# Patient Record
Sex: Female | Born: 1985
Health system: Southern US, Community
[De-identification: ages and names within clinical notes are randomized; demographics above are authoritative.]

## PROBLEM LIST (undated history)

## (undated) ENCOUNTER — Inpatient Hospital Stay (HOSPITAL_COMMUNITY): Payer: Self-pay

## (undated) DIAGNOSIS — I10 Essential (primary) hypertension: Secondary | ICD-10-CM

## (undated) DIAGNOSIS — A599 Trichomoniasis, unspecified: Secondary | ICD-10-CM

## (undated) DIAGNOSIS — O09219 Supervision of pregnancy with history of pre-term labor, unspecified trimester: Secondary | ICD-10-CM

## (undated) DIAGNOSIS — F32A Depression, unspecified: Secondary | ICD-10-CM

## (undated) DIAGNOSIS — F329 Major depressive disorder, single episode, unspecified: Secondary | ICD-10-CM

## (undated) DIAGNOSIS — F1210 Cannabis abuse, uncomplicated: Secondary | ICD-10-CM

## (undated) DIAGNOSIS — O0930 Supervision of pregnancy with insufficient antenatal care, unspecified trimester: Secondary | ICD-10-CM

## (undated) DIAGNOSIS — F419 Anxiety disorder, unspecified: Secondary | ICD-10-CM

---
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
## 2006-01-11 ENCOUNTER — Ambulatory Visit: Payer: Self-pay | Admitting: Gynecology

## 2006-01-15 ENCOUNTER — Ambulatory Visit: Payer: Self-pay | Admitting: Family Medicine

## 2006-01-17 ENCOUNTER — Inpatient Hospital Stay (HOSPITAL_COMMUNITY): Admission: AD | Admit: 2006-01-17 | Discharge: 2006-01-17 | Payer: Self-pay | Admitting: Gynecology

## 2006-01-17 ENCOUNTER — Ambulatory Visit: Payer: Self-pay | Admitting: *Deleted

## 2006-01-25 ENCOUNTER — Ambulatory Visit (HOSPITAL_COMMUNITY): Admission: RE | Admit: 2006-01-25 | Discharge: 2006-01-25 | Payer: Self-pay | Admitting: Obstetrics & Gynecology

## 2006-02-01 ENCOUNTER — Ambulatory Visit: Payer: Self-pay | Admitting: Family Medicine

## 2006-02-15 ENCOUNTER — Ambulatory Visit: Payer: Self-pay | Admitting: Gynecology

## 2006-03-01 ENCOUNTER — Ambulatory Visit: Payer: Self-pay | Admitting: Gynecology

## 2006-03-22 ENCOUNTER — Ambulatory Visit: Payer: Self-pay | Admitting: Family Medicine

## 2006-03-29 ENCOUNTER — Encounter (INDEPENDENT_AMBULATORY_CARE_PROVIDER_SITE_OTHER): Payer: Self-pay | Admitting: *Deleted

## 2006-03-29 ENCOUNTER — Inpatient Hospital Stay (HOSPITAL_COMMUNITY): Admission: AD | Admit: 2006-03-29 | Discharge: 2006-04-01 | Payer: Self-pay | Admitting: Gynecology

## 2006-03-29 ENCOUNTER — Ambulatory Visit: Payer: Self-pay | Admitting: Obstetrics and Gynecology

## 2007-03-19 ENCOUNTER — Inpatient Hospital Stay (HOSPITAL_COMMUNITY): Admission: AD | Admit: 2007-03-19 | Discharge: 2007-03-21 | Payer: Self-pay | Admitting: Obstetrics & Gynecology

## 2007-03-19 ENCOUNTER — Ambulatory Visit: Payer: Self-pay | Admitting: Family Medicine

## 2007-03-20 ENCOUNTER — Encounter: Payer: Self-pay | Admitting: Family Medicine

## 2007-07-03 ENCOUNTER — Emergency Department (HOSPITAL_COMMUNITY): Admission: EM | Admit: 2007-07-03 | Discharge: 2007-07-03 | Payer: Self-pay | Admitting: Family Medicine

## 2008-12-25 ENCOUNTER — Emergency Department (HOSPITAL_COMMUNITY): Admission: EM | Admit: 2008-12-25 | Discharge: 2008-12-25 | Payer: Self-pay | Admitting: Family Medicine

## 2009-02-23 ENCOUNTER — Ambulatory Visit (HOSPITAL_COMMUNITY): Admission: RE | Admit: 2009-02-23 | Discharge: 2009-02-23 | Payer: Self-pay | Admitting: Obstetrics

## 2009-02-23 ENCOUNTER — Inpatient Hospital Stay (HOSPITAL_COMMUNITY): Admission: AD | Admit: 2009-02-23 | Discharge: 2009-02-23 | Payer: Self-pay | Admitting: Obstetrics

## 2009-05-19 ENCOUNTER — Ambulatory Visit (HOSPITAL_COMMUNITY): Admission: RE | Admit: 2009-05-19 | Discharge: 2009-05-19 | Payer: Self-pay | Admitting: Obstetrics

## 2009-06-17 ENCOUNTER — Ambulatory Visit (HOSPITAL_COMMUNITY): Admission: RE | Admit: 2009-06-17 | Discharge: 2009-06-17 | Payer: Self-pay | Admitting: Obstetrics

## 2009-06-29 ENCOUNTER — Inpatient Hospital Stay (HOSPITAL_COMMUNITY): Admission: AD | Admit: 2009-06-29 | Discharge: 2009-06-29 | Payer: Self-pay | Admitting: Obstetrics

## 2009-06-29 ENCOUNTER — Inpatient Hospital Stay (HOSPITAL_COMMUNITY): Admission: AD | Admit: 2009-06-29 | Discharge: 2009-07-01 | Payer: Self-pay | Admitting: Obstetrics

## 2010-07-31 ENCOUNTER — Encounter: Payer: Self-pay | Admitting: Obstetrics

## 2010-07-31 ENCOUNTER — Encounter: Payer: Self-pay | Admitting: *Deleted

## 2010-10-10 LAB — CBC
Hemoglobin: 12.9 g/dL (ref 12.0–15.0)
MCV: 96.6 fL (ref 78.0–100.0)
RBC: 3.73 MIL/uL — ABNORMAL LOW (ref 3.87–5.11)
RBC: 4.03 MIL/uL (ref 3.87–5.11)
WBC: 17.8 10*3/uL — ABNORMAL HIGH (ref 4.0–10.5)
WBC: 21.2 10*3/uL — ABNORMAL HIGH (ref 4.0–10.5)

## 2010-10-10 LAB — COMPREHENSIVE METABOLIC PANEL
ALT: 22 U/L (ref 0–35)
Alkaline Phosphatase: 130 U/L — ABNORMAL HIGH (ref 39–117)
CO2: 21 mEq/L (ref 19–32)
Chloride: 103 mEq/L (ref 96–112)
GFR calc non Af Amer: >60 mL/min (ref >60)
Glucose, Bld: 81 mg/dL (ref 70–99)
Potassium: 3.7 mEq/L (ref 3.5–5.1)
Sodium: 132 mEq/L — ABNORMAL LOW (ref 135–145)
Total Bilirubin: 0.5 mg/dL (ref 0.3–1.2)
Total Protein: 6.5 g/dL (ref 6.0–8.3)

## 2010-10-10 LAB — URINE MICROSCOPIC-ADD ON

## 2010-10-10 LAB — URINALYSIS, ROUTINE W REFLEX MICROSCOPIC
Glucose, UA: NEGATIVE mg/dL
Protein, ur: NEGATIVE mg/dL

## 2010-10-10 LAB — LACTATE DEHYDROGENASE: LDH: 153 U/L (ref 94–250)

## 2010-10-15 LAB — GLUCOSE, CAPILLARY: Glucose-Capillary: 63 mg/dL — ABNORMAL LOW (ref 70–99)

## 2010-10-15 LAB — DIFFERENTIAL
Basophils Absolute: 0 K/uL (ref 0.0–0.1)
Basophils Relative: 0 % (ref 0–1)
Eosinophils Absolute: 0.2 K/uL (ref 0.0–0.7)
Eosinophils Relative: 2 % (ref 0–5)
Lymphocytes Relative: 9 % — ABNORMAL LOW (ref 12–46)
Lymphs Abs: 1.5 K/uL (ref 0.7–4.0)
Monocytes Absolute: 0.3 K/uL (ref 0.1–1.0)
Monocytes Relative: 2 % — ABNORMAL LOW (ref 3–12)
Neutro Abs: 14.3 K/uL — ABNORMAL HIGH (ref 1.7–7.7)
Neutrophils Relative %: 87 % — ABNORMAL HIGH (ref 43–77)

## 2010-10-15 LAB — COMPREHENSIVE METABOLIC PANEL WITH GFR
ALT: 16 U/L (ref 0–35)
AST: 36 U/L (ref 0–37)
Albumin: 3.2 g/dL — ABNORMAL LOW (ref 3.5–5.2)
Alkaline Phosphatase: 51 U/L (ref 39–117)
BUN: 3 mg/dL — ABNORMAL LOW (ref 6–23)
CO2: 25 meq/L (ref 19–32)
Calcium: 9.2 mg/dL (ref 8.4–10.5)
Chloride: 105 meq/L (ref 96–112)
Creatinine, Ser: 0.4 mg/dL (ref 0.4–1.2)
GFR calc non Af Amer: >60 mL/min
Glucose, Bld: 74 mg/dL (ref 70–99)
Potassium: 3.9 meq/L (ref 3.5–5.1)
Sodium: 136 meq/L (ref 135–145)
Total Bilirubin: 0.4 mg/dL (ref 0.3–1.2)
Total Protein: 6 g/dL (ref 6.0–8.3)

## 2010-10-15 LAB — CBC
HCT: 38.8 % (ref 36.0–46.0)
Hemoglobin: 13.3 g/dL (ref 12.0–15.0)
MCHC: 34.3 g/dL (ref 30.0–36.0)
MCV: 98 fL (ref 78.0–100.0)
Platelets: 213 K/uL (ref 150–400)
RBC: 3.96 MIL/uL (ref 3.87–5.11)
RDW: 13.1 % (ref 11.5–15.5)
WBC: 16.4 K/uL — ABNORMAL HIGH (ref 4.0–10.5)

## 2010-10-17 LAB — POCT PREGNANCY, URINE: Preg Test, Ur: POSITIVE

## 2010-11-25 NOTE — Discharge Summary (Signed)
Shannon Gonzales, Shannon Gonzales                ACCOUNT NO.:  [id number]   MEDICAL RECORD NO.:  [id number]          PATIENT TYPE:  INP   LOCATION:  9148                          [facility]   PHYSICIAN:  Tracy L. Mayford Knife, M.D.DATE OF BIRTH:  [birth year]   DATE OF ADMISSION:  03/29/2006  DATE OF DISCHARGE:  04/01/2006                                 DISCHARGE SUMMARY   ADMISSION DIAGNOSIS:  Spontaneous onset of labor at 37 weeks 2 days.   DISCHARGE DIAGNOSES:  1. Status post primary low transverse cesarean section for nonreassuring      fetal heart tones.  2. History of intrauterine fetal demise.  3. History of herpes simplex virus.   PERTINENT LABORATORY DATA:  Admit hemoglobin 10.8, discharge hemoglobin  11.9.   HOSPITAL COURSE:  The patient is a 25 year old, G3, P 0-1-1-0, at 37 weeks 2  days who presented with spontaneous onset of labor. She was initially 5 cm.  The patient's fetal heart tracing became nonreassuring as indicated by  severe variables and did not respond to amnio infusion.  Decision was made  to take her to the operating room.  Please see op note for full details.  The patient delivered a viable female, Apgars 9 and 9.  Postoperative was  unremarkable.  The patient ambulated on postoperative day #1 and voided  without difficulty.  Hemoglobin as above.  The patient was found to be  stable for discharge on postoperative day #3 and discharged home.  Follow up  in six weeks at [facility].   DISCHARGE MEDICATIONS:  1. Percocet 5 mg q.4h. p.r.n. pain.  2. Ibuprofen 600 mg one q.6h. p.r.n. pain.  3. Colace 100 mg p.o. b.i.d.  4. Prenatal vitamins.  5. Micronor one tablet daily.  Advised to start on two weeks and to change      to full OCPs when she discontinues breast feeding.   DISCHARGE INSTRUCTIONS:  1. __________  for six weeks.  2. No heavy lifting.  3. Return for evaluation with signs of infection.  4. Diet regular.  5. Activity as above.     ______________________________  Marc Morgans. Mayford Knife, M.D.     TLW/MEDQ  D:  04/01/2006  T:  04/03/2006  Job:  [id number]

## 2010-11-25 NOTE — Op Note (Signed)
[name], Shannon Gonzales                ACCOUNT NO.:  [id number]   MEDICAL RECORD NO.:  [id number]          PATIENT TYPE:  AMB   LOCATION:  SDC                           [facility]  [facility]   PHYSICIAN:  James A. Ashley Royalty, M.D.DATE OF BIRTH:  05/27/1986   DATE OF PROCEDURE:  02/24/2005  DATE OF DISCHARGE:                                 OPERATIVE REPORT   PREOPERATIVE DIAGNOSIS:  Missed abortion at approximately nine weeks'  gestation.   POSTOPERATIVE DIAGNOSIS:  Missed abortion at approximately nine weeks'  gestation. Path pending.   PROCEDURE:  Suction dilatation curettage.   SURGEON:  Rudy Jew. Ashley Royalty, M.D.   ANESTHESIA:  Monitored anesthesia care with 1% Xylocaine paracervical block  (20 mL).   ESTIMATED BLOOD LOSS:  Less than 50 mL.   COMPLICATIONS:  None.   PACKS DRAINS:  None.   PROCEDURE:  The patient was taken to the operating room and placed in  dorsosupine position. After IV sedation was administered she was placed in  the lithotomy position, prepped, draped in usual manner for vaginal surgery.  Posterior weighted retractor was placed per vagina and anterior lip of the  cervix grasped with single-tooth tenaculum. The uterus was gently sounded to  approximately 9 cm. The cervical os was opened slightly verified already  open to a diameter of the 31-French. 9 mm suction curette was placed in the  uterine cavity. Suction applied. Later a 10 mm suction curette was employed.  After several passes with the suction curette no additional tissue was  obtained. All curettings were submitted to pathology for histologic studies.  The vaginal instruments were removed. Hemostasis noted and procedure  terminated.   The patient tolerated procedure extremely well and was returned to the  recovery room in good condition.           ______________________________  Rudy Jew Ashley Royalty, M.D.     JAM/MEDQ  D:  02/24/2005  T:  02/24/2005  Job:  [id number]

## 2010-11-25 NOTE — H&P (Signed)
Shannon Gonzales, Shannon Gonzales                ACCOUNT NO.:  [id number]   MEDICAL RECORD NO.:  [id number]          PATIENT TYPE:  OUT   LOCATION:  ULT                           [facility]   PHYSICIAN:  James A. Ashley Royalty, M.D.DATE OF BIRTH:  06-24-1986   DATE OF ADMISSION:  10/18/2004  DATE OF DISCHARGE:                                HISTORY & PHYSICAL   HISTORY OF PRESENT ILLNESS:  This is a 25 year old gravida 2, para 0, AB 1,  last menstrual period June 07, 2004, currently approximately 19 weeks'  gestation by menstrual dates.  The patient presented for new OB appointment  October 13, 2004, having had some previous prenatal care at [facility].  Fetal heart tones were auscultated on the initial visit,  October 13, 2004.  In addition the patient had an ultrasound at [facility] August 25, 2004, placing her at 11 weeks 3 days' gestation,  consistent with an ultrasound estimated date of confinement of March 13, 2005 which was felt consistent with her menstrual dates.   The patient presented today for screening ultrasound.  Unfortunately she was  found to have an intrauterine fetal demise at just over 16 weeks' gestation.  She is henceforth Cytotec induction.   MEDICATIONS:  Prenatal vitamins.   PAST MEDICAL HISTORY:  1.  Positive for Chlamydia.  2.  Trichomonas - treated.  3.  Also positive for depression.   PAST SURGICAL HISTORY:  The patient had dilatation and curettage in 2005 for  a therapeutic abortion.   ALLERGIES:  None.   FAMILY HISTORY:  Negative.   SOCIAL HISTORY:  The patient denies use of tobacco or significant alcohol.   REVIEW OF SYSTEMS:  Noncontributory.   PHYSICAL EXAMINATION:  GENERAL:  Well-developed, well-nourished, pleasant  female in no acute distress.  VITAL SIGNS:  Afebrile, vital signs stable.  SKIN:  Warm and dry without lesions.  LYMPH:  There is no supraclavicular, cervical, or inguinal adenopathy.  HEENT:   Normocephalic.  NECK:  Supple without thyromegaly.  CHEST:  Lungs are clear.  CARDIAC:  Regular rate and rhythm without murmurs, gallops, or rubs.  BREASTS:  Exam deferred.  ABDOMEN:  Soft and nontender without masses or organomegaly.  Bowel sounds  are active.  MUSCULOSKELETAL:  No CVA tenderness.  PELVIC:  Please see October 13, 2004 exam.  External genitalia within normal  limits.  Vagina and cervix are without gross lesions.  Uterus is  approximately 16 weeks' size.   IMPRESSION:  1.  Intrauterine pregnancy at approximately 19 weeks' gestation by dates.  2.  Intrauterine fetal demise.  Ultrasound measurements just over 16 weeks'      gestation.   PLAN:  Will admit for Cytotec induction October 19, 2004.  Will check  intrauterine fetal demise labs.  Risks, benefits, __________and alternatives  fully discussed with the patient.  She states she understands and accepts.  Questions invited and answered.      JAM/MEDQ  D:  10/18/2004  T:  10/18/2004  Job:  [id number]

## 2010-11-25 NOTE — Op Note (Signed)
Shannon Gonzales, Shannon Gonzales                ACCOUNT NO.:  [id number]   MEDICAL RECORD NO.:  [id number]          PATIENT TYPE:  INP   LOCATION:  9148                          [facility]   PHYSICIAN:  Phil D. Okey Dupre, M.D.     DATE OF BIRTH:  10/10/1985   DATE OF PROCEDURE:  03/29/2006  DATE OF DISCHARGE:                                 OPERATIVE REPORT   PREOPERATIVE DIAGNOSES:  1. Term intrauterine pregnancy at 37-2/7 weeks' gestation.  2. Nonreassuring fetal heart tones.  3. History of intrauterine fetal demise.   POSTOPERATIVE DIAGNOSES:  1. Term intrauterine pregnancy at 37-2/7 weeks' gestation.  2. Nonreassuring fetal heart tones.  3. History of intrauterine fetal demise.   PROCEDURE:  Primary low transverse cesarean section.   SURGEON:  Javier Glazier. Okey Dupre, MD   ASSISTANT:  Paticia Stack, MD   ANESTHESIA:  Epidural.   SPECIMENS:  Placenta to pathology.   ESTIMATED BLOOD LOSS:  1000 mL.   COMPLICATIONS:  None.   FINDINGS:  A viable female infant, Apgars 9 and 9, weight 6 pounds 1 ounce.  Cord pH 7.25.   REASON FOR PROCEDURE:  This is a 25 year old gravida 3, para 0-1-1-0, at 78-  2/7 weeks' gestation, who presented to the maternity admissions with  spontaneous onset of labor and was found to be dilated 5 cm.  After a couple  of hours the patient developed nonreassuring fetal heart tones and was taken  for a cesarean section.   PROCEDURE:  The patient was taken to the operating room, where epidural  anesthesia was found to be adequate.  She was then prepared and draped in a  normal sterile fashion in the dorsal supine position with a leftward tilt.  A Pfannenstiel skin incision was then made with scalpel and carried through  to the underlying fascia.  The fascia was incised in the midline and the  incision extended laterally with the Mayo scissors.  The superior aspect of  the fascial incision was then grasped with a Kocher clamp, elevated, and the  underlying rectus muscles  dissected off bluntly.  Attention was then  directed to the inferior aspect of this incision, which in a similar fashion  was grasped, tented up with a Kocher clamp, and the rectus muscles dissected  off bluntly.  The rectus muscles were then separated in the midline and the  peritoneum identified, tented up and entered sharply with Metzenbaum  scissors.  The peritoneal incision was then extended superiorly and  inferiorly with good visualization of the bladder.  The bladder blade was  then inserted and the vesicouterine peritoneum identified, grasped with the  pickups, and entered sharply with the Metzenbaum scissors.  This incision  was then extended laterally and a bladder flap created digitally.  The  bladder blade was then reinserted and the lower uterine segment incised in a  transverse fashion with the scalpel.  The uterine incision was then extended  laterally.  The bladder blade was removed and the infant's head delivered  atraumatically.  The nose and mouth were suctioned and the cord clamped and  cut.  The infant was handed off to the nurse.  Cord gases were sent.  The  placenta was then removed manually and cleared of all clots and debris.  The  uterine incision was repaired with 1-0 chromic in a running locked fashion.  Pressure was applied to the uterine incision with good hemostasis.  This was  rechecked several times with good hemostasis.  The fascia was reapproximated  with 0 Vicryl in a running fashion.  The skin was closed with staples.  The  patient tolerated the procedure well.  All sponge, lap and needle counts  were correct x2.  The patient was taken to the recovery room in stable  condition.     ______________________________  Paticia Stack, MD    ______________________________  Javier Glazier Okey Dupre, M.D.    Alphia Kava  D:  03/29/2006  T:  03/30/2006  Job:  [id number]

## 2010-11-25 NOTE — H&P (Signed)
NAMEHADLEE, BURBACK                ACCOUNT NO.:  [id number]   MEDICAL RECORD NO.:  [id number]          PATIENT TYPE:  AMB   LOCATION:  SDC                           [facility]   PHYSICIAN:  James A. Ashley Royalty, M.D.DATE OF BIRTH:  1985/10/03   DATE OF ADMISSION:  02/24/2005  DATE OF DISCHARGE:                                HISTORY & PHYSICAL   This is an 25 year old gravida 3, para 0-0-2-0, at approximately 8 weeks 5  days gestation.  She presented today for ultrasound to document viability,  and unfortunately a missed abortion was diagnosed.  The mean gestational age  was 8 weeks 6 days without cardiac activity.  Hence, she is for dilatation  and curettage.   PAST MEDICAL HISTORY:  Medical history of depression.   SURGICAL:  Negative.   ALLERGIES:  None.   FAMILY HISTORY:  Noncontributory.   SOCIAL HISTORY:  The patient denies use of tobacco or significant alcohol.   MEDICATIONS:  Vitamins.   PHYSICAL EXAMINATION:  GENERAL:  Well-developed, well-nourished pleasant  black female in no acute distress.  VITAL SIGNS:  Afebrile, vital signs stable.  CHEST:  Lungs are clear.  CARDIAC:  Regular rate and rhythm.  ABDOMEN:  Soft, nontender.  PELVIC EXAMINATION:  Deferred until examination under anesthesia.   IMPRESSION:  Missed abortion at approximately [redacted] weeks gestation.   PLAN:  Suction dilatation and curettage.  __________ complications, and  alternatives were discussed with the patient.  She states she understands  and accepts.  Questions invited and answered.           ______________________________  Rudy Jew Ashley Royalty, M.D.     JAM/MEDQ  D:  02/23/2005  T:  02/23/2005  Job:  [id number]

## 2011-01-17 ENCOUNTER — Inpatient Hospital Stay (INDEPENDENT_AMBULATORY_CARE_PROVIDER_SITE_OTHER)
Admission: RE | Admit: 2011-01-17 | Discharge: 2011-01-17 | Disposition: A | Discharge status: Home / Self Care | Payer: Self-pay | Source: Ambulatory Visit | Attending: Emergency Medicine | Admitting: Emergency Medicine

## 2011-01-17 DIAGNOSIS — S60459A Superficial foreign body of unspecified finger, initial encounter: Secondary | ICD-10-CM

## 2011-04-21 LAB — CREATININE, SERUM
GFR calc Af Amer: >60
GFR calc non Af Amer: >60

## 2011-04-21 LAB — RAPID URINE DRUG SCREEN, HOSP PERFORMED
Barbiturates: NOT DETECTED
Benzodiazepines: NOT DETECTED

## 2011-04-21 LAB — DIFFERENTIAL
Basophils Relative: 0
Eosinophils Absolute: 0
Eosinophils Absolute: 0.3
Eosinophils Relative: 0
Eosinophils Relative: 2
Lymphocytes Relative: 13
Lymphs Abs: 1.6
Lymphs Abs: 2.1
Monocytes Relative: 4
Monocytes Relative: 6
Neutrophils Relative %: 87 — ABNORMAL HIGH

## 2011-04-21 LAB — TYPE AND SCREEN: ABO/RH(D): B POS

## 2011-04-21 LAB — URINALYSIS, ROUTINE W REFLEX MICROSCOPIC
Glucose, UA: NEGATIVE
Ketones, ur: 15 — AB
Nitrite: NEGATIVE
Protein, ur: NEGATIVE
Urobilinogen, UA: 1

## 2011-04-21 LAB — ABO/RH: ABO/RH(D): B POS

## 2011-04-21 LAB — CBC
HCT: 28.4 — ABNORMAL LOW
HCT: 34.7 — ABNORMAL LOW
Hemoglobin: 12
MCV: 94.2
MCV: 94.7
Platelets: 225
RBC: 3.66 — ABNORMAL LOW
RDW: 12.7
WBC: 15.9 — ABNORMAL HIGH
WBC: 23.4 — ABNORMAL HIGH

## 2011-04-21 LAB — SICKLE CELL SCREEN: Sickle Cell Screen: NEGATIVE

## 2011-04-21 LAB — RUBELLA SCREEN: Rubella: 357.7 — ABNORMAL HIGH

## 2012-07-10 NOTE — L&D Delivery Note (Signed)
Delivery Note At 8:55 PM a viable female was delivered via VBAC, Spontaneous (Presentation: Occiput Anterior).  APGAR: 8, 9; weight 5lb 1.1 oz.  Placenta status: spontaneous, intact.  Cord:  3 vessel with no complications.  Cord pH: not sent. Infant placed immediately on mother's chest, moved to warmer for further evaluation, and taken to NICU. Mother plans to breast and bottle feed.  Anesthesia:  Epidural Episiotomy: None Lacerations: None  Suture Repair: None Est. Blood Loss (mL): 150  Mom to postpartum.  Baby to NICU.  Selena Lesser 05/07/2013, 9:12 PM

## 2012-07-10 NOTE — L&D Delivery Note (Signed)
I have seen and examined this patient and I agree with the above. Cam Hai 1:36 AM 05/08/2013

## 2013-05-01 ENCOUNTER — Other Ambulatory Visit (HOSPITAL_COMMUNITY): Payer: Self-pay | Admitting: Nurse Practitioner

## 2013-05-01 DIAGNOSIS — Z3689 Encounter for other specified antenatal screening: Secondary | ICD-10-CM

## 2013-05-01 LAB — OB RESULTS CONSOLE GC/CHLAMYDIA
Chlamydia: NEGATIVE
Gonorrhea: NEGATIVE

## 2013-05-01 LAB — OB RESULTS CONSOLE RUBELLA ANTIBODY, IGM: Rubella: NON-IMMUNE/NOT IMMUNE

## 2013-05-02 ENCOUNTER — Other Ambulatory Visit (HOSPITAL_COMMUNITY): Payer: Self-pay | Admitting: Nurse Practitioner

## 2013-05-02 ENCOUNTER — Ambulatory Visit (HOSPITAL_COMMUNITY)
Admission: RE | Admit: 2013-05-02 | Discharge: 2013-05-02 | Disposition: A | Discharge status: Home / Self Care | Payer: Medicaid Other | Source: Ambulatory Visit | Attending: Nurse Practitioner | Admitting: Nurse Practitioner

## 2013-05-02 DIAGNOSIS — O0930 Supervision of pregnancy with insufficient antenatal care, unspecified trimester: Secondary | ICD-10-CM | POA: Insufficient documentation

## 2013-05-02 DIAGNOSIS — Z3689 Encounter for other specified antenatal screening: Secondary | ICD-10-CM

## 2013-05-02 DIAGNOSIS — O34219 Maternal care for unspecified type scar from previous cesarean delivery: Secondary | ICD-10-CM | POA: Insufficient documentation

## 2013-05-02 DIAGNOSIS — O99330 Smoking (tobacco) complicating pregnancy, unspecified trimester: Secondary | ICD-10-CM | POA: Insufficient documentation

## 2013-05-05 ENCOUNTER — Other Ambulatory Visit (HOSPITAL_COMMUNITY): Payer: Self-pay | Admitting: Physician Assistant

## 2013-05-05 DIAGNOSIS — Z0374 Encounter for suspected problem with fetal growth ruled out: Secondary | ICD-10-CM

## 2013-05-07 ENCOUNTER — Encounter (HOSPITAL_COMMUNITY): Payer: Medicaid Other | Admitting: Anesthesiology

## 2013-05-07 ENCOUNTER — Inpatient Hospital Stay (HOSPITAL_COMMUNITY)
Admission: AD | Admit: 2013-05-07 | Discharge: 2013-05-09 | DRG: 775 | Disposition: A | Discharge status: Home / Self Care | Payer: Medicaid Other | Source: Ambulatory Visit | Attending: Obstetrics and Gynecology | Admitting: Obstetrics and Gynecology

## 2013-05-07 ENCOUNTER — Inpatient Hospital Stay (HOSPITAL_COMMUNITY): Payer: Medicaid Other | Admitting: Anesthesiology

## 2013-05-07 ENCOUNTER — Encounter (HOSPITAL_COMMUNITY): Payer: Self-pay | Admitting: *Deleted

## 2013-05-07 DIAGNOSIS — O34219 Maternal care for unspecified type scar from previous cesarean delivery: Principal | ICD-10-CM | POA: Diagnosis present

## 2013-05-07 DIAGNOSIS — O99334 Smoking (tobacco) complicating childbirth: Secondary | ICD-10-CM | POA: Diagnosis present

## 2013-05-07 HISTORY — DX: Cannabis abuse, uncomplicated: F12.10

## 2013-05-07 HISTORY — DX: Trichomoniasis, unspecified: A59.9

## 2013-05-07 HISTORY — DX: Depression, unspecified: F32.A

## 2013-05-07 HISTORY — DX: Supervision of pregnancy with history of pre-term labor, unspecified trimester: O09.219

## 2013-05-07 HISTORY — DX: Anxiety disorder, unspecified: F41.9

## 2013-05-07 HISTORY — DX: Supervision of pregnancy with insufficient antenatal care, unspecified trimester: O09.30

## 2013-05-07 HISTORY — DX: Major depressive disorder, single episode, unspecified: F32.9

## 2013-05-07 LAB — RAPID URINE DRUG SCREEN, HOSP PERFORMED
Amphetamines: NOT DETECTED
Benzodiazepines: NOT DETECTED
Cocaine: NOT DETECTED
Opiates: NOT DETECTED
Tetrahydrocannabinol: POSITIVE — AB

## 2013-05-07 LAB — CBC
HCT: 34.1 % — ABNORMAL LOW (ref 36.0–46.0)
Hemoglobin: 11.5 g/dL — ABNORMAL LOW (ref 12.0–15.0)
MCH: 30.8 pg (ref 26.0–34.0)
MCHC: 33.7 g/dL (ref 30.0–36.0)
MCV: 91.4 fL (ref 78.0–100.0)
Platelets: 336 10*3/uL (ref 150–400)
RDW: 14.1 % (ref 11.5–15.5)
WBC: 20.7 10*3/uL — ABNORMAL HIGH (ref 4.0–10.5)

## 2013-05-07 LAB — COMPREHENSIVE METABOLIC PANEL
AST: 37 U/L (ref 0–37)
Albumin: 2.4 g/dL — ABNORMAL LOW (ref 3.5–5.2)
Alkaline Phosphatase: 117 U/L (ref 39–117)
BUN: 6 mg/dL (ref 6–23)
CO2: 21 mEq/L (ref 19–32)
Chloride: 102 mEq/L (ref 96–112)
Creatinine, Ser: 0.39 mg/dL — ABNORMAL LOW (ref 0.50–1.10)
GFR calc Af Amer: >90 mL/min (ref >90)
Potassium: 3.8 mEq/L (ref 3.5–5.1)
Total Protein: 6 g/dL (ref 6.0–8.3)

## 2013-05-07 LAB — LACTATE DEHYDROGENASE: LDH: 209 U/L (ref 94–250)

## 2013-05-07 LAB — TYPE AND SCREEN: Antibody Screen: NEGATIVE

## 2013-05-07 LAB — OB RESULTS CONSOLE GC/CHLAMYDIA: Gonorrhea: NEGATIVE

## 2013-05-07 LAB — OB RESULTS CONSOLE GBS: GBS: NEGATIVE

## 2013-05-07 LAB — OB RESULTS CONSOLE RPR: RPR: NONREACTIVE

## 2013-05-07 LAB — RAPID HIV SCREEN (WH-MAU): Rapid HIV Screen: NONREACTIVE

## 2013-05-07 MED ORDER — PRENATAL MULTIVITAMIN CH
1.0000 | ORAL_TABLET | Freq: Every day | ORAL | Status: DC
  Administered 2013-05-08 – 2013-05-09 (×2): 1 via ORAL
  Filled 2013-05-07 (×2): qty 1

## 2013-05-07 MED ORDER — PHENYLEPHRINE 40 MCG/ML (10ML) SYRINGE FOR IV PUSH (FOR BLOOD PRESSURE SUPPORT)
80.0000 ug | PREFILLED_SYRINGE | INTRAVENOUS | Status: DC | PRN
  Administered 2013-05-07 (×2): 80 ug via INTRAVENOUS
  Filled 2013-05-07: qty 2

## 2013-05-07 MED ORDER — LIDOCAINE HCL (PF) 1 % IJ SOLN
INTRAMUSCULAR | Status: DC | PRN
  Administered 2013-05-07 (×2): 9 mL

## 2013-05-07 MED ORDER — IBUPROFEN 600 MG PO TABS: 600.0000 mg | ORAL_TABLET | Freq: Four times a day (QID) | ORAL | Status: DC | PRN

## 2013-05-07 MED ORDER — ZOLPIDEM TARTRATE 5 MG PO TABS: 5.0000 mg | ORAL_TABLET | Freq: Every evening | ORAL | Status: DC | PRN

## 2013-05-07 MED ORDER — SODIUM CHLORIDE 0.9 % IV SOLN
2.0000 g | Freq: Once | INTRAVENOUS | Status: AC
  Administered 2013-05-07: 2 g via INTRAVENOUS
  Filled 2013-05-07: qty 2000

## 2013-05-07 MED ORDER — PHENYLEPHRINE 40 MCG/ML (10ML) SYRINGE FOR IV PUSH (FOR BLOOD PRESSURE SUPPORT)
80.0000 ug | PREFILLED_SYRINGE | INTRAVENOUS | Status: DC | PRN
  Filled 2013-05-07: qty 2
  Filled 2013-05-07: qty 10

## 2013-05-07 MED ORDER — LANOLIN HYDROUS EX OINT: TOPICAL_OINTMENT | CUTANEOUS | Status: DC | PRN

## 2013-05-07 MED ORDER — TETANUS-DIPHTH-ACELL PERTUSSIS 5-2.5-18.5 LF-MCG/0.5 IM SUSP: 0.5000 mL | Freq: Once | INTRAMUSCULAR | Status: DC

## 2013-05-07 MED ORDER — DIBUCAINE 1 % RE OINT: 1.0000 "application " | TOPICAL_OINTMENT | RECTAL | Status: DC | PRN

## 2013-05-07 MED ORDER — FENTANYL CITRATE 0.05 MG/ML IJ SOLN
100.0000 ug | INTRAMUSCULAR | Status: DC | PRN
  Administered 2013-05-07: 100 ug via INTRAVENOUS
  Filled 2013-05-07: qty 2

## 2013-05-07 MED ORDER — LACTATED RINGERS IV SOLN: 500.0000 mL | INTRAVENOUS | Status: DC | PRN

## 2013-05-07 MED ORDER — FENTANYL CITRATE 0.05 MG/ML IJ SOLN: 100.0000 ug | INTRAMUSCULAR | Status: DC | PRN

## 2013-05-07 MED ORDER — FENTANYL 2.5 MCG/ML BUPIVACAINE 1/10 % EPIDURAL INFUSION (WH - ANES)
14.0000 mL/h | INTRAMUSCULAR | Status: DC | PRN
  Administered 2013-05-07: 14 mL/h via EPIDURAL
  Filled 2013-05-07 (×2): qty 125

## 2013-05-07 MED ORDER — EPHEDRINE 5 MG/ML INJ
10.0000 mg | INTRAVENOUS | Status: DC | PRN
  Filled 2013-05-07: qty 2

## 2013-05-07 MED ORDER — DIPHENHYDRAMINE HCL 25 MG PO CAPS: 25.0000 mg | ORAL_CAPSULE | Freq: Four times a day (QID) | ORAL | Status: DC | PRN

## 2013-05-07 MED ORDER — WITCH HAZEL-GLYCERIN EX PADS: 1.0000 "application " | MEDICATED_PAD | CUTANEOUS | Status: DC | PRN

## 2013-05-07 MED ORDER — SIMETHICONE 80 MG PO CHEW: 80.0000 mg | CHEWABLE_TABLET | ORAL | Status: DC | PRN

## 2013-05-07 MED ORDER — IBUPROFEN 600 MG PO TABS
600.0000 mg | ORAL_TABLET | Freq: Four times a day (QID) | ORAL | Status: DC
  Administered 2013-05-08 – 2013-05-09 (×4): 600 mg via ORAL
  Filled 2013-05-07 (×4): qty 1

## 2013-05-07 MED ORDER — OXYTOCIN 40 UNITS IN LACTATED RINGERS INFUSION - SIMPLE MED
62.5000 mL/h | INTRAVENOUS | Status: DC
  Administered 2013-05-07: 62.5 mL/h via INTRAVENOUS
  Filled 2013-05-07: qty 1000

## 2013-05-07 MED ORDER — OXYCODONE-ACETAMINOPHEN 5-325 MG PO TABS: 1.0000 | ORAL_TABLET | ORAL | Status: DC | PRN

## 2013-05-07 MED ORDER — FENTANYL CITRATE 0.05 MG/ML IJ SOLN
INTRAMUSCULAR | Status: AC
  Administered 2013-05-07: 100 ug via INTRAVENOUS
  Filled 2013-05-07: qty 2

## 2013-05-07 MED ORDER — SENNOSIDES-DOCUSATE SODIUM 8.6-50 MG PO TABS
2.0000 | ORAL_TABLET | ORAL | Status: DC
  Administered 2013-05-08 (×2): 2 via ORAL
  Filled 2013-05-07 (×2): qty 2

## 2013-05-07 MED ORDER — EPHEDRINE 5 MG/ML INJ
10.0000 mg | INTRAVENOUS | Status: DC | PRN
  Filled 2013-05-07: qty 4
  Filled 2013-05-07: qty 2

## 2013-05-07 MED ORDER — OXYCODONE-ACETAMINOPHEN 5-325 MG PO TABS: 2.0000 | ORAL_TABLET | ORAL | Status: DC | PRN

## 2013-05-07 MED ORDER — CITRIC ACID-SODIUM CITRATE 334-500 MG/5ML PO SOLN: 30.0000 mL | ORAL | Status: DC | PRN

## 2013-05-07 MED ORDER — ACETAMINOPHEN 325 MG PO TABS: 650.0000 mg | ORAL_TABLET | ORAL | Status: DC | PRN

## 2013-05-07 MED ORDER — LACTATED RINGERS IV SOLN
INTRAVENOUS | Status: DC
  Administered 2013-05-07 (×3): via INTRAVENOUS

## 2013-05-07 MED ORDER — ONDANSETRON HCL 4 MG/2ML IJ SOLN
4.0000 mg | Freq: Four times a day (QID) | INTRAMUSCULAR | Status: DC | PRN
  Administered 2013-05-07 (×2): 4 mg via INTRAVENOUS
  Filled 2013-05-07 (×2): qty 2

## 2013-05-07 MED ORDER — OXYCODONE-ACETAMINOPHEN 5-325 MG PO TABS
1.0000 | ORAL_TABLET | ORAL | Status: DC | PRN
  Administered 2013-05-08: 2 via ORAL
  Administered 2013-05-08 (×3): 1 via ORAL
  Administered 2013-05-09: 2 via ORAL
  Filled 2013-05-07: qty 1
  Filled 2013-05-07: qty 2
  Filled 2013-05-07 (×2): qty 1
  Filled 2013-05-07: qty 2

## 2013-05-07 MED ORDER — LACTATED RINGERS IV SOLN
500.0000 mL | Freq: Once | INTRAVENOUS | Status: AC
  Administered 2013-05-07: 500 mL via INTRAVENOUS

## 2013-05-07 MED ORDER — LIDOCAINE HCL (PF) 1 % IJ SOLN
30.0000 mL | INTRAMUSCULAR | Status: DC | PRN
  Filled 2013-05-07 (×2): qty 30

## 2013-05-07 MED ORDER — OXYTOCIN BOLUS FROM INFUSION: 500.0000 mL | INTRAVENOUS | Status: DC

## 2013-05-07 MED ORDER — DIPHENHYDRAMINE HCL 50 MG/ML IJ SOLN: 12.5000 mg | INTRAMUSCULAR | Status: DC | PRN

## 2013-05-07 MED ORDER — BENZOCAINE-MENTHOL 20-0.5 % EX AERO
1.0000 "application " | INHALATION_SPRAY | CUTANEOUS | Status: DC | PRN
  Administered 2013-05-08: 1 via TOPICAL
  Filled 2013-05-07: qty 56

## 2013-05-07 NOTE — Anesthesia Procedure Notes (Signed)
Epidural Patient location during procedure: OB Start time: 05/07/2013 9:54 AM End time: 05/07/2013 9:58 AM  Staffing Anesthesiologist: Leilani Able Performed by: anesthesiologist   Preanesthetic Checklist Completed: patient identified, surgical consent, pre-op evaluation, timeout performed, IV checked, risks and benefits discussed and monitors and equipment checked  Epidural Patient position: sitting Prep: site prepped and draped and DuraPrep Patient monitoring: continuous pulse ox and blood pressure Approach: midline Injection technique: LOR air  Needle:  Needle type: Tuohy  Needle gauge: 17 G Needle length: 9 cm and 9 Needle insertion depth: 6 cm Catheter type: closed end flexible Catheter size: 19 Gauge Catheter at skin depth: 11 cm Test dose: negative and Other  Assessment Sensory level: T9 Events: blood not aspirated, injection not painful, no injection resistance, negative IV test and no paresthesia  Additional Notes Reason for block:procedure for pain

## 2013-05-07 NOTE — MAU Note (Signed)
Contractions started this morning.  Hx of preterm delivery. No problems with this preg.  Denies bleeding or leaking.

## 2013-05-07 NOTE — Progress Notes (Signed)
Shannon Gonzales is a 27 y.o. [id number] at [redacted]w[redacted]d admitted for active preterm labor  Subjective:   Pt doing well. Not feeling pressure from contractions.   Objective: BP 140/94  Pulse 99  Temp(Src) 97.4 F (36.3 C) (Axillary)  Resp 16  Ht 4\' 11"  (1.499 m)  Wt 62.143 kg (137 lb)  BMI 27.66 kg/m2  SpO2 55%  Breastfeeding? Unknown      FHT:  FHR: 140 bpm, variability: moderate,  accelerations:  Present,  decelerations:  Absent UC:   irregular, every 8-12 minutes SVE:   Dilation: 6 Effacement (%): 90;100 Station: -1 Exam by:: Dr.Teresea Donley  Labs: Lab Results  Component Value Date   WBC 20.7* 05/07/2013   HGB 11.5* 05/07/2013   HCT 34.1* 05/07/2013   MCV 91.4 05/07/2013   PLT 336 05/07/2013    Assessment / Plan: preterm labor  Labor: slowing now in progress. during check, AROM performed per Dr. Emelda Fear.  Fetal Wellbeing:  Category I after AROM, some deep variables with slow return to baseline x2 but then recovered. NICU at delivery Pain Control:  Epidural I/D:  n/a Anticipated MOD:  NSVD  Shannon Gonzales L 05/07/2013, 7:14 PM

## 2013-05-07 NOTE — Progress Notes (Signed)
Shannon Gonzales is a 27 y.o. [id number] at [redacted]w[redacted]d by 33wk ultrasound admitted for active labor  Subjective:  Pt comfortable now s/p epidural. No VB, LOF. +FM.    Objective: BP 131/89  Pulse 102  Temp(Src) 97.7 F (36.5 C) (Oral)  Resp 18  Ht 4\' 11"  (1.499 m)  Wt 62.143 kg (137 lb)  BMI 27.66 kg/m2  SpO2 55%  Breastfeeding? Unknown      FHT:  FHR: 125 bpm, variability: moderate,  accelerations:  Present,  decelerations:  Absent UC:   Irregular every 2-6 SVE:   Dilation: 6 Effacement (%): 90;100 Station: -1 Exam by:: Dr.Zoua Caporaso  Labs: Lab Results  Component Value Date   WBC 20.7* 05/07/2013   HGB 11.5* 05/07/2013   HCT 34.1* 05/07/2013   MCV 91.4 05/07/2013   PLT 336 05/07/2013    Assessment / Plan: PTL, progressing  Labor: cont to monitor. no augmentation given preterm status Fetal Wellbeing:  Category I Pain Control:  Epidural I/D:  GBS PCR neg. stopped amp Anticipated MOD:  NSVD  Shannon Gonzales 05/07/2013, 12:44 PM

## 2013-05-07 NOTE — MAU Note (Signed)
27 yo, G5P3 at [redacted]w[redacted]d, presents to MAU with c/o contractions for last 2 hours; hx significant for preterm labors at 36wks and 28 wks. Reports PNC began last month; has been told she has incompetent cervix. Denies HSV, LOF, VB.

## 2013-05-07 NOTE — Anesthesia Preprocedure Evaluation (Signed)
Anesthesia Evaluation  Patient identified by MRN, date of birth, ID band Patient awake    Reviewed: Allergy & Precautions, H&P , NPO status , Patient's Chart, lab work & pertinent test results  Airway Mallampati: I TM Distance: >3 FB Neck ROM: full    Dental no notable dental hx.    Pulmonary neg pulmonary ROS,    Pulmonary exam normal       Cardiovascular negative cardio ROS      Neuro/Psych negative neurological ROS     GI/Hepatic negative GI ROS, Neg liver ROS,   Endo/Other  negative endocrine ROS  Renal/GU negative Renal ROS  negative genitourinary   Musculoskeletal negative musculoskeletal ROS (+)   Abdominal Normal abdominal exam  (+)   Peds negative pediatric ROS (+)  Hematology negative hematology ROS (+)   Anesthesia Other Findings   Reproductive/Obstetrics (+) Pregnancy                           Anesthesia Physical Anesthesia Plan  ASA: II  Anesthesia Plan: Epidural   Post-op Pain Management:    Induction:   Airway Management Planned:   Additional Equipment:   Intra-op Plan:   Post-operative Plan:   Informed Consent: I have reviewed the patients History and Physical, chart, labs and discussed the procedure including the risks, benefits and alternatives for the proposed anesthesia with the patient or authorized representative who has indicated his/her understanding and acceptance.     Plan Discussed with:   Anesthesia Plan Comments:         Anesthesia Quick Evaluation

## 2013-05-07 NOTE — Progress Notes (Signed)
Pt transferred to room 310. Pt taken to NICU to see baby. Report given to Rn on [facility]. Dr. Emelda Fear on unit aware of pts elevated pressures.

## 2013-05-07 NOTE — H&P (Signed)
HPI: Shannon Gonzales is a 27 y.o. year old [id number] female at [redacted]w[redacted]d weeks gestation who presents to MAU reporting preterm labor. Late limited PNC at [facility] x <1 month. Records not in Epic, requested. Normal, but incomplete anatomy US 1 week ago.   Pregnancy significant for: 1. Hx C/S x 1 for NRFHTs followed by VBAC x 2.  2. Hx PTL->PTD 3. Late/limited PNC.  Maternal Medical History:  Reason for admission: Contractions.     OB History   Grav Para Term Preterm Abortions TAB SAB Ect Mult Living   5 3 1 2  0 0 1 0 0 3     History reviewed. No pertinent past medical history. Past Surgical History  Procedure Laterality Date  . No past surgeries    . Cesarean section     Family History: family history is not on file. Social History:  reports that she has been smoking.  She has never used smokeless tobacco. She reports that she uses illicit drugs (Hashish). She reports that she does not drink alcohol.   Prenatal Transfer Tool  Maternal Diabetes: Unknown Genetic Screening: Declined Maternal Ultrasounds/Referrals: Normal Fetal Ultrasounds or other Referrals:  None Maternal Substance Abuse:  Yes:  Type: Other:  Significant Maternal Medications:  None Significant Maternal Lab Results:  Lab values include: Other:  Other Comments:  Late/limited PNC, unk GBS, Hashish use, PTD.  Review of Systems  Constitutional: Negative for fever and chills.  Eyes: Negative for blurred vision and double vision.  Gastrointestinal: Positive for abdominal pain (contractions). Negative for vomiting.  Neurological: Negative for headaches.    Dilation: 4 Effacement (%): 100 Station: -2 Exam by:: Ivonne Andrew, CNM  Blood pressure 140/101, pulse 90, temperature 97.7 F (36.5 C), resp. rate 20, height 4' 11.5" (1.511 m), weight 62.143 kg (137 lb). Maternal Exam:  Uterine Assessment: Contraction strength is firm.  Contraction frequency is regular.   Abdomen: Fundal height is S=D.   Fetal presentation:  vertex  Introitus: Normal vulva. Vulva is negative for lesion.  Pelvis: adequate for delivery.   Cervix: Cervix evaluated by digital exam.     Fetal Exam Fetal Monitor Review: Mode: ultrasound.   Baseline rate: 120.  Variability: moderate (6-25 bpm).   Pattern: accelerations present and no decelerations.    Fetal State Assessment: Category I - tracings are normal.     Physical Exam  Nursing note and vitals reviewed. Constitutional: She is oriented to person, place, and time. She appears well-developed and well-nourished. She appears distressed.  HENT:  Head: Normocephalic.  Eyes: Conjunctivae are normal.  Cardiovascular: Normal rate and regular rhythm.   Respiratory: Effort normal and breath sounds normal.  GI: Soft. There is no tenderness.  Genitourinary: Vagina normal. Vulva exhibits no lesion.  Musculoskeletal: Normal range of motion. She exhibits no edema.  Neurological: She is alert and oriented to person, place, and time.  Skin: Skin is warm and dry.  Psychiatric: She has a normal mood and affect.    Prenatal labs: ABO, Rh:   Antibody:   Rubella:   RPR:    HBsAg:    HIV:    GBS:   Pending  Assessment: 1. Labor: Active preterm labor--Urge to push, soft, stretchy cervix. 2. Fetal Wellbeing: Category II  3. Pain Control: Requesting epidural 4. GBS: Pending 5. 34.3 week IUP  Plan:  1. Admit to BS per consult with MD 2. Routine L&D orders 3. Analgesia/anesthesia PRN 4. Amp for GBS 5. Requesting [facility] records. Will draw prenatal panel PRN.  Kona Yusuf 05/07/2013, 8:32 AM

## 2013-05-07 NOTE — Progress Notes (Signed)
ABBRIELLE Gonzales is a 27 y.o. [phone] at [redacted]w[redacted]d by 33wk [place] admitted in active labor (preterm)  Subjective: Feeling subjectively warm, with sweats but otherwise no complaints at this time  Objective: BP 122/71  Pulse 95  Temp(Src) 98.5 F (36.9 C) (Oral)  Resp 18  Ht 4\' 11"  (1.499 m)  Wt 62.143 kg (137 lb)  BMI 27.66 kg/m2  SpO2 55%  Breastfeeding? Unknown    improvement in BPs Repeat temp by RN Vernona Rieger 97.1  FHT:  FHR: 135 bpm, variability: moderate,  accelerations:  Present,  decelerations:  Present variable, questionable late after accel UC:   8-12 min SVE:   Dilation: 6 Effacement (%): 90;100 Station: -1 Exam by:: Dr.Beck  Labs: Lab Results  Component Value Date   WBC 20.7* 05/07/2013   HGB 11.5* 05/07/2013   HCT 34.1* 05/07/2013   MCV 91.4 05/07/2013   PLT 336 05/07/2013    Assessment / Plan: Spontaneous labor preterm Plan to recheck in 1 hr  Labor: preterm labor, will not augment Preeclampsia:  BP improved (one systolic elevated to 150s) Fetal Wellbeing:  Category I Pain Control:  Epidural I/D:  n/a (initially started on amp 2/2 gbs unknown but pcr neg so this was d/c'd) Anticipated MOD:  NSVD  Anselm Lis 05/07/2013, 3:24 PM

## 2013-05-08 ENCOUNTER — Encounter (HOSPITAL_COMMUNITY): Payer: Self-pay | Admitting: *Deleted

## 2013-05-08 DIAGNOSIS — O34219 Maternal care for unspecified type scar from previous cesarean delivery: Secondary | ICD-10-CM

## 2013-05-08 DIAGNOSIS — O99334 Smoking (tobacco) complicating childbirth: Secondary | ICD-10-CM

## 2013-05-08 LAB — HEPATITIS B SURFACE ANTIGEN: Hepatitis B Surface Ag: NEGATIVE

## 2013-05-08 LAB — RUBELLA SCREEN: Rubella: 7.24 Index — ABNORMAL HIGH (ref <0.90)

## 2013-05-08 MED ORDER — MEASLES, MUMPS & RUBELLA VAC ~~LOC~~ INJ
0.5000 mL | INJECTION | Freq: Once | SUBCUTANEOUS | Status: DC
  Filled 2013-05-08: qty 0.5

## 2013-05-08 NOTE — Anesthesia Postprocedure Evaluation (Signed)
  Anesthesia Post-op Note  Patient: Shannon Gonzales  Procedure(s) Performed: * No procedures listed *  Patient Location: Women's Unit  Anesthesia Type:Epidural  Level of Consciousness: awake, alert  and oriented  Airway and Oxygen Therapy: Patient Spontanous Breathing  Post-op Pain: none  Post-op Assessment: Post-op Vital signs reviewed and Patient's Cardiovascular Status Stable  Post-op Vital Signs: Reviewed and stable  Complications: No apparent anesthesia complications

## 2013-05-08 NOTE — Progress Notes (Signed)
Post Partum Day #1 Subjective: no complaints, up ad lib, voiding, tolerating PO and + flatus. Has visited infant twice in NICU. Is pumping. Desires Depo for contraception.  Objective: Blood pressure 141/95, pulse 64, temperature 98.2 F (36.8 C), temperature source Oral, resp. rate 18, height 4\' 11"  (1.499 m), weight 62.143 kg (137 lb), SpO2 100.00%, unknown if currently breastfeeding. Previous BP was 132/88  Physical Exam:  General: alert, cooperative and no distress Heart: RRR Lungs: CTA Lochia: appropriate Uterine Fundus: firm DVT Evaluation: No evidence of DVT seen on physical exam. Negative Homan's   Recent Labs  05/07/13 0815  HGB 11.5*  HCT 34.1*    Assessment/Plan: Plan for discharge tomorrow and Breastfeeding Will monitor BP   LOS: 1 day   Selena Lesser 05/08/2013, 7:53 AM   I have seen and examined this patient and I agree with the above. Cam Hai 8:52 AM 05/08/2013

## 2013-05-08 NOTE — Progress Notes (Signed)
UR completed 

## 2013-05-09 MED ORDER — PNEUMOCOCCAL VAC POLYVALENT 25 MCG/0.5ML IJ INJ
0.5000 mL | INJECTION | Freq: Once | INTRAMUSCULAR | Status: AC
  Administered 2013-05-09: 0.5 mL via INTRAMUSCULAR
  Filled 2013-05-09: qty 0.5

## 2013-05-09 MED ORDER — OXYCODONE-ACETAMINOPHEN 5-325 MG PO TABS: 1.0000 | ORAL_TABLET | ORAL | Status: DC | PRN

## 2013-05-09 MED ORDER — IBUPROFEN 600 MG PO TABS: 600.0000 mg | ORAL_TABLET | Freq: Four times a day (QID) | ORAL | Status: DC

## 2013-05-09 MED ORDER — ZOLPIDEM TARTRATE 5 MG PO TABS: 5.0000 mg | ORAL_TABLET | Freq: Every evening | ORAL | Status: DC | PRN

## 2013-05-09 NOTE — Progress Notes (Signed)
Clinical Social Work Department PSYCHOSOCIAL ASSESSMENT - MATERNAL/CHILD 05/09/2013  Patient:  Shannon Gonzales, Shannon Gonzales  Account Number:  [id number]  Admit Date:  05/07/2013  Marjo Bicker Name:   Shannon Gonzales    Clinical Social Worker:  Lulu Riding, LCSW   Date/Time:  05/09/2013 10:15 AM  Date Referred:  05/09/2013   Referral source  RN     Referred reason  Substance Abuse   Other referral source:    I:  FAMILY / HOME ENVIRONMENT Child's legal guardian:  PARENT  Guardian - Name Guardian - Age Guardian - Address  Shannon Gonzales 27 [address] [place], [place] [address]  FOB not involved     Other household support members/support persons Name Relationship DOB  Shannon Gonzales OTHER    Shannon Gonzales OTHER Daughter  2007  Shannon Gonzales        Daughter           2008 Bre                        Daughter           2010    Other support:   MOB states her cousin and her cousin's husband, whom she lives with, are her greatest support people.  She states her only other true support person is the [facility] of her three daughters.    II  PSYCHOSOCIAL DATA Information Source:  Patient Interview  Event organiser Employment:   N/A   Financial resources:   If Medicaid - County:    School / Grade:   Maternity Care Coordinator / Child Services Coordination / Early Interventions:   CC4C  Cultural issues impacting care:   none stated    III  STRENGTHS Strengths  Compliance with medical plan  Other - See comment  Understanding of illness  Supportive family/friends   Strength comment:  Pediatric follow up will be at [facility]   IV  RISK FACTORS AND CURRENT PROBLEMS Current Problem:  YES   Risk Factor & Current Problem Patient Issue Family Issue Risk Factor / Current Problem Comment  Mental Illness Y N hx of Bipolar, Personality Disorder, PTSD  Substance Abuse Y N hx marijuana use  TRANSPORTATION Y N     V  SOCIAL WORK ASSESSMENT  CSW met with MOB in her third floor room/310 to  introduce myself and complete assessment for NICU admission, behavioral health concerns and marijuana use.  MOB was very pleasant and welcoming of CSW.  She was very talkative and states she and baby are doing well at this time.  She discussed her hx of having a preterm baby at 28 weeks who stayed in the NICU for approximately 2 months 6 years ago.  MOB currently has 3 daughters, ages 49, 66, and 48.  She states her first pregnancy ended in a fetal demise around 7 months pregnant.  She feels she has dealt with that loss and feels like she has been given a gift with this baby since that baby was a boy and now this baby is a boy.  She initially told CSW that she was in denial about her pregnancy and thought about termination.  She had an ultrasound and decided she could not go through with it and does not believe she really ever would have.  She states everything was going well for her until the FOB stopped being supportive and stopped helping her pay bills.  She lost her apartment in February of this year and is  now living with her cousin.  She states she has not spoken to FOB since August and that she is better off this way.  She states her cousin is her greatest support person and she has allowed MOB and her daughters to stay there and is fine with baby moving in as well.  She states they can stay as long as needed until MOB saves up enough money to get her own place again.  MOB states her only income is Ivory's SSI check from being premature.  She reports being on [facility] list.  She states she is not interested in [address], which CSW suggested as an option, because she states she does not want to have any rules if she is going to be paying rent somewhere.  She reports not having any baby supplies at home, but is asking for assistance from [facility] where she lived during her first pregnancy.  CSW made referral to [employer].  CSW asked about MH dx's listed in her PNR and she  states she does not feel she was accurately diagnosed, but that she has been through a lot in her life.  She listed numerous deaths of family members and spending time in foster care.  CSW recommends outpatient counseling as a way to process her past experiences/feelings and develop positive coping strategies.  She is very open to this.  She states the [facility] informed her of [facility] [place].  CSW encouraged her to call and provided her with information.  CSW informed MOB of the [facility] program and will make referral.  MOB agreed.  CC4C will also be involved.  CSW informed MOB of hospital drug screen policy due to her late entry to care.  She states this was because of her denial and possible plans to terminate.  She admits to marijuana use during pregnancy to help with nausea and appetite.  CSW informed her that her UDS was positive for [facility] and she states her last use was a few weeks ago.  CSW told her baby's UDS was negative, but meconium is pending.  CSW informed MOB that a CPS report is mandated if the screen comes back positive.  MOB was understanding and asked appropriate questions.  She denies any prior involvement with CPS.  CSW discussed signs and symptoms of PPD, which MOB denies after her other births.  She was engaged in the discussion and agrees to call CSW or her doctor if she has emotional concerns.  CSW asked if she will have child care and transportation to visit baby after her discharge today and she states PGM of her daughters will continue to help care for them (which is who is watching them while she is in the hospital), but transportation will be a hardship.  CSW offered bus passes, which MOB accepted with gratitude.  CSW gave 6 and asked MOB to call if she needs more.  CSW gave contact information and asked MOB to call any time.  MOB seemed very appreciative of CSW's visit.   VI SOCIAL WORK PLAN Social Work Plan  Psychosocial  Support/Ongoing Assessment of Needs   Type of pt/family education:   PPD signs and symptoms  Importance of mental health treatment  Ongoing support services offered by NICU CSW  hospital drug screen policy   If child protective services report - county:   If child protective services report - date:   Information/referral to community  resources comment:   [facility]  [facility]-Inhome counseling/support   Other social work plan:   CSW will monitor meconium drug screen result

## 2013-05-09 NOTE — Discharge Summary (Signed)
Obstetric Discharge Summary Reason for Admission: onset of labor and preterm Prenatal Procedures: none Intrapartum Procedures: spontaneous vaginal delivery and VBAC Postpartum Procedures: none Complications-Operative and Postpartum: none Hemoglobin  Date Value Range Status  05/07/2013 11.5* 12.0 - 15.0 g/dL Final     HCT  Date Value Range Status  05/07/2013 34.1* 36.0 - 46.0 % Final    Physical Exam:  General: alert, cooperative, appears stated age and no distress Lochia: appropriate Uterine Fundus: Firm U-1  DVT Evaluation: No evidence of DVT seen on physical exam. Negative Homan's sign. No cords or calf tenderness. No significant calf/ankle edema.  Discharge Diagnoses: Premature labor  Discharge Information: Date: 05/09/2013 Activity: pelvic rest and routine exerceise Diet: routine Medications: PNV, Ibuprofen, Percocet and ambien Condition: stable Instructions: refer to practice specific booklet Discharge to: home Follow-up Information   Follow up with [facility] [place]. Schedule an appointment as soon as possible for a visit in 4 weeks.   Contact information:   [address] [place] [address] [phone]      Newborn Data: Live born female  Birth Weight: 5 lb 1.1 oz (2299 g) APGAR: 8, 9  Stay in NICU.  Pt admitted in preterm labor and delivered a preterm infant. Pt was not on  progresterone and had [facility] at [facility]. NSVD over intact perineum. Pt with no acute issues post partum. Pt desires depo and will go to health department for injection. Pt breast feeding/pumping.   Jolyn Lent RYAN 05/09/2013, 11:20 AM

## 2013-05-09 NOTE — Discharge Instructions (Signed)

## 2013-05-09 NOTE — Progress Notes (Signed)
Pt discharged to home via city bus. Pt ambulated to front door with L. Snyder,NT and NT watched pt safely get to the bus stop. Pt plans to get breast pump from [facility] office.  No equipment for home ordered at discharge.

## 2013-05-11 NOTE — H&P (Signed)
Attestation of Attending Supervision of Advanced Practitioner (CNM/NP): Evaluation and management procedures were performed by the Advanced Practitioner under my supervision and collaboration.  I have reviewed the Advanced Practitioner's note and chart, and I agree with the management and plan.  Nohea Kras 05/11/2013 4:06 PM

## 2013-05-12 ENCOUNTER — Encounter (HOSPITAL_COMMUNITY): Payer: Self-pay | Admitting: *Deleted

## 2013-05-13 NOTE — Discharge Summary (Signed)
Attestation of Attending Supervision of Advanced Practitioner (CNM/NP): Evaluation and management procedures were performed by the Advanced Practitioner under my supervision and collaboration.  I have reviewed the Advanced Practitioner's note and chart, and I agree with the management and plan.  Delano Frate 05/13/2013 11:27 AM

## 2013-05-30 ENCOUNTER — Ambulatory Visit (HOSPITAL_COMMUNITY): Payer: Medicaid Other

## 2013-06-23 ENCOUNTER — Ambulatory Visit: Payer: Medicaid Other | Admitting: Obstetrics & Gynecology

## 2014-05-11 ENCOUNTER — Encounter (HOSPITAL_COMMUNITY): Payer: Self-pay | Admitting: *Deleted

## 2016-02-11 ENCOUNTER — Emergency Department (HOSPITAL_COMMUNITY): Payer: Medicaid Other

## 2016-02-11 ENCOUNTER — Emergency Department (HOSPITAL_COMMUNITY)
Admission: EM | Admit: 2016-02-11 | Discharge: 2016-02-11 | Disposition: A | Discharge status: Home / Self Care | Payer: Medicaid Other | Attending: Emergency Medicine | Admitting: Emergency Medicine

## 2016-02-11 ENCOUNTER — Encounter (HOSPITAL_COMMUNITY): Payer: Self-pay | Admitting: Emergency Medicine

## 2016-02-11 DIAGNOSIS — F17200 Nicotine dependence, unspecified, uncomplicated: Secondary | ICD-10-CM | POA: Insufficient documentation

## 2016-02-11 DIAGNOSIS — R42 Dizziness and giddiness: Secondary | ICD-10-CM

## 2016-02-11 DIAGNOSIS — E876 Hypokalemia: Secondary | ICD-10-CM | POA: Insufficient documentation

## 2016-02-11 DIAGNOSIS — D72829 Elevated white blood cell count, unspecified: Secondary | ICD-10-CM | POA: Insufficient documentation

## 2016-02-11 DIAGNOSIS — Z79899 Other long term (current) drug therapy: Secondary | ICD-10-CM | POA: Insufficient documentation

## 2016-02-11 DIAGNOSIS — I10 Essential (primary) hypertension: Secondary | ICD-10-CM

## 2016-02-11 LAB — BASIC METABOLIC PANEL
ANION GAP: 8 (ref 5–15)
BUN: <5 mg/dL — ABNORMAL LOW (ref 6–20)
CHLORIDE: 103 mmol/L (ref 101–111)
CO2: 26 mmol/L (ref 22–32)
Calcium: 9.6 mg/dL (ref 8.9–10.3)
Creatinine, Ser: 0.7 mg/dL (ref 0.44–1.00)
GFR calc non Af Amer: >60 mL/min (ref >60)
Glucose, Bld: 92 mg/dL (ref 65–99)
Potassium: 2.6 mmol/L — CL (ref 3.5–5.1)
SODIUM: 137 mmol/L (ref 135–145)

## 2016-02-11 LAB — CBC
HCT: 42.7 % (ref 36.0–46.0)
Hemoglobin: 14.1 g/dL (ref 12.0–15.0)
MCH: 30.7 pg (ref 26.0–34.0)
MCHC: 33 g/dL (ref 30.0–36.0)
MCV: 93 fL (ref 78.0–100.0)
PLATELETS: 332 10*3/uL (ref 150–400)
RBC: 4.59 MIL/uL (ref 3.87–5.11)
RDW: 14.9 % (ref 11.5–15.5)
WBC: 17.7 10*3/uL — AB (ref 4.0–10.5)

## 2016-02-11 LAB — MAGNESIUM: Magnesium: 2.2 mg/dL (ref 1.7–2.4)

## 2016-02-11 LAB — I-STAT BETA HCG BLOOD, ED (MC, WL, AP ONLY): I-stat hCG, quantitative: <5 m[IU]/mL (ref <5)

## 2016-02-11 MED ORDER — POTASSIUM CHLORIDE CRYS ER 20 MEQ PO TBCR
40.0000 meq | EXTENDED_RELEASE_TABLET | Freq: Once | ORAL | Status: AC
  Administered 2016-02-11: 40 meq via ORAL
  Filled 2016-02-11: qty 2

## 2016-02-11 MED ORDER — ACETAMINOPHEN 325 MG PO TABS
650.0000 mg | ORAL_TABLET | Freq: Once | ORAL | Status: AC
  Administered 2016-02-11: 650 mg via ORAL
  Filled 2016-02-11: qty 2

## 2016-02-11 MED ORDER — LISINOPRIL 5 MG PO TABS: 5.0000 mg | ORAL_TABLET | Freq: Every day | ORAL | 0 refills | Status: DC

## 2016-02-11 MED ORDER — POTASSIUM CHLORIDE 10 MEQ/100ML IV SOLN
10.0000 meq | Freq: Once | INTRAVENOUS | Status: AC
  Administered 2016-02-11: 10 meq via INTRAVENOUS
  Filled 2016-02-11: qty 100

## 2016-02-11 MED ORDER — POTASSIUM CHLORIDE ER 10 MEQ PO TBCR: 10.0000 meq | EXTENDED_RELEASE_TABLET | Freq: Every day | ORAL | 0 refills | Status: DC

## 2016-02-11 NOTE — ED Notes (Signed)
Pt asking for something to drink and I advised her to wait until after blood work comes back.

## 2016-02-11 NOTE — ED Provider Notes (Signed)
[facility]-EMERGENCY DEPT Provider Note   CSN: [id number] Arrival date & time: 02/11/16  1404  First Provider Contact:  First MD Initiated Contact with Patient 02/11/16 1411     History   Chief Complaint Chief Complaint  Patient presents with  . Dizziness    HPI Shannon Gonzales is a 30 y.o. female.  HPI Patient presents to the emergency room for evaluation of an episode of lightheadedness.  The patient was at work today when she suddenly began feeling lightheaded. Felt as if she was going to pass out. She had to call 911 to bring her to the emergency room for evaluation. She denies any trouble with nausea or vomiting. She denies any dysuria. She denies any fevers or chills. Denies having a headache. She denies any trouble with neck pain or stiffness. Patient states she's had similar episodes in the past but this was when she was pregnant. She has not missed her pregnancy and does not think she is pregnant today.  Patient also mentions having intermittent episodes of chest pain for the last several months. She'll have a sharp pain in her chest that comes and goes. She has not seen anyone for that and denies having any trouble with any chest pain today. Past Medical History:  Diagnosis Date  . Anxiety   . Depression   . Drug abuse, marijuana    with pregnancy  . Hx of PTL (preterm labor), current pregnancy   . Late prenatal care   . Trichimoniasis    with pregnancy    There are no active problems to display for this patient.   Past Surgical History:  Procedure Laterality Date  . CESAREAN SECTION    . NO PAST SURGERIES      OB History    Gravida Para Term Preterm AB Living   7 5 1 4 1 4    SAB TAB Ectopic Multiple Live Births           2       Home Medications    Prior to Admission medications   Medication Sig Start Date End Date Taking? Authorizing Provider  lisinopril (PRINIVIL,ZESTRIL) 5 MG tablet Take 1 tablet (5 mg total) by mouth daily. 02/11/16   Linwood Dibbles, MD    potassium chloride (K-DUR) 10 MEQ tablet Take 1 tablet (10 mEq total) by mouth daily. 02/11/16   Linwood Dibbles, MD  zolpidem (AMBIEN) 5 MG tablet Take 1 tablet (5 mg total) by mouth at bedtime as needed for sleep (insomnia.). Patient not taking: Reported on 02/11/2016 05/09/13   Minta Balsam, MD    Family History No family history on file.  Social History Social History  Substance Use Topics  . Smoking status: Current Every Day Smoker    Packs/day: 0.50  . Smokeless tobacco: Never Used  . Alcohol use No     Allergies   Review of patient's allergies indicates no known allergies.   Review of Systems Review of Systems  All other systems reviewed and are negative.    Physical Exam Updated Vital Signs BP (!) 164/120   Pulse 75   Temp 97.7 F (36.5 C) (Oral)   Resp 17   LMP 01/16/2016   SpO2 100%   Physical Exam  Constitutional: She appears well-developed and well-nourished. No distress.  HENT:  Head: Normocephalic and atraumatic.  Right Ear: External ear normal.  Left Ear: External ear normal.  Eyes: Conjunctivae are normal. Right eye exhibits no discharge. Left eye exhibits no discharge. No  scleral icterus.  Neck: Neck supple. No tracheal deviation present.  Cardiovascular: Normal rate, regular rhythm and intact distal pulses.   Pulmonary/Chest: Effort normal and breath sounds normal. No stridor. No respiratory distress. She has no wheezes. She has no rales.  Abdominal: Soft. Bowel sounds are normal. She exhibits no distension. There is no tenderness. There is no rebound and no guarding.  Musculoskeletal: She exhibits no edema or tenderness.  Neurological: She is alert. She has normal strength. No cranial nerve deficit (no facial droop, extraocular movements intact, no slurred speech) or sensory deficit. She exhibits normal muscle tone. She displays no seizure activity. Coordination normal.  Skin: Skin is warm and dry. No rash noted.  Psychiatric: She has a normal mood and  affect.  Nursing note and vitals reviewed.    ED Treatments / Results  Labs (all labs ordered are listed, but only abnormal results are displayed) Labs Reviewed  CBC - Abnormal; Notable for the following:       Result Value   WBC 17.7 (*)    All other components within normal limits  BASIC METABOLIC PANEL - Abnormal; Notable for the following:    Potassium 2.6 (*)    BUN <5 (*)    All other components within normal limits  MAGNESIUM  I-STAT BETA HCG BLOOD, ED ([facility], [facility], [facility] ONLY)    EKG  EKG Interpretation  Date/Time:  Friday February 11 2016 15:04:19 EDT Ventricular Rate:  88 PR Interval:    QRS Duration: 88 QT Interval:  394 QTC Calculation: 477 R Axis:   69 Text Interpretation:  Sinus rhythm Borderline T wave abnormalities Borderline prolonged QT interval No old tracing to compare Confirmed by Freddie Dymek  MD-J, Hibo Blasdell ([id number]) on 02/11/2016 5:26:14 PM       Radiology Dg Chest 2 View  Result Date: 02/11/2016 CLINICAL DATA:  Patient with intermittent chest pain for 7 months. EXAM: CHEST  2 VIEW COMPARISON:  None. FINDINGS: Monitoring leads overlie the patient. Normal cardiac and mediastinal contours. No consolidative pulmonary opacities. No pleural effusion or pneumothorax. Regional skeleton is unremarkable. IMPRESSION: No active cardiopulmonary disease. Electronically Signed   By: Annia Belt M.D.   On: 02/11/2016 15:15    Procedures Procedures (including critical care time)  Medications Ordered in ED Medications  potassium chloride 10 mEq in 100 mL IVPB (10 mEq Intravenous New Bag/Given 02/11/16 1628)  potassium chloride SA (K-DUR,KLOR-CON) CR tablet 40 mEq (40 mEq Oral Given 02/11/16 1629)  acetaminophen (TYLENOL) tablet 650 mg (650 mg Oral Given 02/11/16 1656)     Initial Impression / Assessment and Plan / ED Course  I have reviewed the triage vital signs and the nursing notes.  Pertinent labs & imaging results that were available during my care of the patient were reviewed by  me and considered in my medical decision making (see chart for details).  Clinical Course  Hypertension. Patient has a history of hypertension. She was told in the past she may need to be on medications.  Patient has remained hypertensive here. I do not think this is related to her episode of weakness today but I will give her a prescription for lisinopril. I recommend she follow up with primary care doctor.  Hypokalemia. I suspect hypokalemia was the cause of her weakness today. I'm unclear as to the source of this at this point. She does not take any medications that would predispose her to hypokalemia. IV potassium and ordered oral potassium. I will give her prescription for potassium and  recommend that she have her potassium level rechecked in the next week.  Leukocytosis Patient does have a chronic leukocytosis.  Her white blood cell count was 23,000 back in 2008.  There is no evidence to suggest any blast cells or other findings to suggest leukemia.  I do recommend she follow up with primary care doctor in a full physical. She may benefit from following up with the hematologist.  Final Clinical Impressions(s) / ED Diagnoses   Final diagnoses:  Lightheadedness  Hypokalemia  Leukocytosis  Essential hypertension    New Prescriptions New Prescriptions   LISINOPRIL (PRINIVIL,ZESTRIL) 5 MG TABLET    Take 1 tablet (5 mg total) by mouth daily.   POTASSIUM CHLORIDE (K-DUR) 10 MEQ TABLET    Take 1 tablet (10 mEq total) by mouth daily.     Linwood Dibbles, MD 02/11/16 [phone]

## 2016-02-11 NOTE — Progress Notes (Signed)
Patient listed as having Medicaid insurance without a pcp.  [facility] provided patient with list of providers who accept Medicaid in [facility].  Patient thankful for resources.  No further EDCM needs at this time.

## 2016-02-11 NOTE — ED Triage Notes (Signed)
Per EMS generalized headache onset yesterday.

## 2016-02-25 ENCOUNTER — Other Ambulatory Visit (HOSPITAL_COMMUNITY): Payer: Self-pay | Admitting: Family Medicine

## 2016-10-24 ENCOUNTER — Encounter (HOSPITAL_COMMUNITY): Payer: Self-pay | Admitting: Emergency Medicine

## 2016-10-24 ENCOUNTER — Ambulatory Visit (HOSPITAL_COMMUNITY)
Admission: EM | Admit: 2016-10-24 | Discharge: 2016-10-24 | Disposition: A | Discharge status: Home / Self Care | Payer: Medicaid Other | Attending: Internal Medicine | Admitting: Internal Medicine

## 2016-10-24 DIAGNOSIS — I10 Essential (primary) hypertension: Secondary | ICD-10-CM | POA: Diagnosis not present

## 2016-10-24 DIAGNOSIS — R531 Weakness: Secondary | ICD-10-CM | POA: Diagnosis not present

## 2016-10-24 LAB — POCT I-STAT, CHEM 8
BUN: 11 mg/dL (ref 6–20)
Calcium, Ion: 1.19 mmol/L (ref 1.15–1.40)
Chloride: 105 mmol/L (ref 101–111)
Creatinine, Ser: 0.7 mg/dL (ref 0.44–1.00)
GLUCOSE: 71 mg/dL (ref 65–99)
HCT: 39 % (ref 36.0–46.0)
Hemoglobin: 13.3 g/dL (ref 12.0–15.0)
POTASSIUM: 3.8 mmol/L (ref 3.5–5.1)
Sodium: 139 mmol/L (ref 135–145)
TCO2: 26 mmol/L (ref 0–100)

## 2016-10-24 MED ORDER — LOSARTAN POTASSIUM 50 MG PO TABS: 50.0000 mg | ORAL_TABLET | Freq: Every day | ORAL | 0 refills | Status: DC

## 2016-10-24 NOTE — ED Triage Notes (Addendum)
Concerned for blood pressure .  Diagnosed with htn last year, non-compliant.  Patient has been out of work due to blood pressure.    Patient wants a note about blood pressure today Has not had blood pressure medicine today

## 2016-10-24 NOTE — Discharge Instructions (Signed)
As soon as possible you need to see a primary care provider. He will need to have additional blood pressures taken. He will also need to have additional lab work performed. For now stop taking the lisinopril and start taking the Losartan medication. If you start developing any new symptoms, not feeling well, severe headache, weakness on one part of your body, blood in the urine or other problems she will need to seek medical attention promptly.

## 2016-10-24 NOTE — ED Provider Notes (Signed)
CSN: [id number]     Arrival date & time 10/24/16  1139 History   First MD Initiated Contact with Patient 10/24/16 1238     Chief Complaint  Patient presents with  . 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 She is requesting a note to go back to work, have her blood pressure checked and possibly get something for her blood pressure. She was initially prescribed lisinopril 5 mg but she is currently not taking it and he did not seem to help. She states she feels fine she has no complaints. Her review of systems is completely negative.      Past Medical History:  Diagnosis Date  . Anxiety   . Depression   . Drug abuse, marijuana    with pregnancy  . Hx of PTL (preterm labor), current pregnancy   . Late prenatal care   . Trichimoniasis    with pregnancy   Past Surgical History:  Procedure Laterality Date  . CESAREAN SECTION    . NO PAST SURGERIES     No family history on file. Social History  Substance Use Topics  . Smoking status: Current Every Day Smoker    Packs/day: 0.50  . Smokeless tobacco: Never Used  . Alcohol use No   OB History    Gravida Para Term Preterm AB Living   SAB TAB Ectopic Multiple Live Births           2     Review of Systems  Constitutional: Negative.   HENT: Negative.   Respiratory: Negative.   Cardiovascular: Negative.   Gastrointestinal: Negative.   Genitourinary: Negative.   Musculoskeletal: Negative.   Neurological: Negative.   All other systems reviewed and are negative.   Allergies  Patient has no known allergies.  Home Medications   Prior to Admission medications   Medication Sig Start Date End Date Taking? Authorizing Provider  losartan (COZAAR) 50 MG tablet Take 1 tablet (50  mg total) by mouth daily. 10/24/16   Hayden Rasmussen, NP   Meds Ordered and Administered this Visit  Medications - No data to display  BP (!) 183/122 (BP Location: Right Arm)   Pulse 75   Temp 98.5 F (36.9 C) (Oral)   Resp 18   LMP 10/17/2016   SpO2 100%  No data found.   Physical Exam  Constitutional: She is oriented to person, place, and time. She appears well-developed and well-nourished. No distress.  Eyes: EOM are normal.  Neck: Normal range of motion. Neck supple.  Cardiovascular: Normal rate, regular rhythm, normal heart sounds and intact distal pulses.   No murmur heard. Pulmonary/Chest: Effort normal and breath sounds normal. No respiratory distress.  Abdominal: Soft. There is no tenderness.  Musculoskeletal: She exhibits no edema.  Neurological: She is alert and oriented to person, place, and time. No cranial nerve deficit. She exhibits normal muscle tone. Coordination normal.  Skin: Skin is warm and dry.  Psychiatric: She has a normal mood and affect.  Nursing note and vitals reviewed.   Urgent Care Course     Procedures (including critical care time)  Labs Review Labs Reviewed  POCT I-STAT, CHEM 8    Imaging Review No results found.   Visual Acuity Review  Right Eye Distance:  Left Eye Distance:   Bilateral Distance:    Right Eye Near:   Left Eye Near:    Bilateral Near:         MDM   1. Hypertension, unspecified type    As soon as possible you need to see a primary care provider. He will need to have additional blood pressures taken. He will also need to have additional lab work performed. For now stop taking the lisinopril and start taking the Losartan medication. If you start developing any new symptoms, not feeling well, severe headache, weakness on one part of your body, blood in the urine or other problems she will need to seek medical attention promptly. Meds ordered this encounter  Medications  . losartan (COZAAR) 50 MG tablet    Sig:  Take 1 tablet (50 mg total) by mouth daily.    Dispense:  30 tablet    Refill:  0    Order Specific Question:   Supervising Provider    Answer:   Eustace Moore [[id number]]       Hayden Rasmussen, NP 10/24/16 1340

## 2016-12-06 NOTE — Congregational Nurse Program (Signed)
Congregational Nurse Program Note  Date of Encounter: 01/03/2017 Past Medical History: Past Medical History:  Diagnosis Date  . Anxiety   . Depression   . Drug abuse, marijuana    with pregnancy  . Hx of PTL (preterm labor), current pregnancy   . Late prenatal care   . Trichimoniasis    with pregnancy    Encounter Details:     CNP Questionnaire - 12/06/16 1700      Patient Demographics   Is this a new or existing patient? New   Patient is considered a/an Not Applicable   Race African-American/Black     Patient Assistance   Location of Patient Assistance [facility]   Patient's financial/insurance status Medicaid   Uninsured Patient ([place] [facility]/Care Connects) No   Patient referred to apply for the following financial assistance Not Applicable   Food insecurities addressed Not Applicable   Transportation assistance No   Assistance securing medications No   Educational health offerings Medications;Hypertension     Encounter Details   Primary purpose of visit Navigating the Healthcare System   Was an Emergency Department visit averted? Not Applicable   Does patient have a medical provider? No   Patient referred to [facility]   Was a mental health screening completed? (GAINS tool) No   Does patient have dental issues? No   Does patient have vision issues? No   Does your patient have an abnormal blood pressure today? No   Since previous encounter, have you referred patient for abnormal blood pressure that resulted in a new diagnosis or medication change? No   Does your patient have an abnormal blood glucose today? No   Since previous encounter, have you referred patient for abnormal blood glucose that resulted in a new diagnosis or medication change? No   Was there a life-saving intervention made? No    Client concern about her blood pressure. She is on medication . Check[facility] BP [phone]. [employer] CorporationHelena Griselda Bramblett Market researcherN BSN BC CN.         Client voices no complaints still taking Blood Pressure medications. [address] Gonzales CourtHelena Shavonda Wiedman RN BSN  [employer] CN [phone]

## 2017-01-30 NOTE — Congregational Nurse Program (Signed)
Congregational Nurse Program Note  Date of Encounter: 01/30/2017  Past Medical History: Past Medical History:  Diagnosis Date  . Anxiety   . Depression   . Drug abuse, marijuana    with pregnancy  . Hx of PTL (preterm labor), current pregnancy   . Late prenatal care   . Trichimoniasis    with pregnancy    Encounter Details:     CNP Questionnaire - 01/30/17 1521      Patient Demographics   Is this a new or existing patient? Existing   Patient is considered a/an Not Applicable   Race African-American/Black     Patient Assistance   Location of Patient Assistance [facility]   Patient's financial/insurance status Medicaid   Uninsured Patient ([place] [facility]/Care Connects) No   Patient referred to apply for the following financial assistance Not Applicable   Food insecurities addressed Not Applicable   Transportation assistance No   Assistance securing medications No   Educational health offerings Hypertension     Encounter Details   Primary purpose of visit Chronic Illness/Condition Visit   Was an Emergency Department visit averted? Not Applicable   Does patient have a medical provider? Yes   Patient referred to Follow up with established PCP   Was a mental health screening completed? (GAINS tool) No   Does patient have dental issues? No   Does patient have vision issues? No   Does your patient have an abnormal blood pressure today? No   Since previous encounter, have you referred patient for abnormal blood pressure that resulted in a new diagnosis or medication change? No   Does your patient have an abnormal blood glucose today? No   Since previous encounter, have you referred patient for abnormal blood glucose that resulted in a new diagnosis or medication change? No   Was there a life-saving intervention made? No     Checked clients BP because she was not feeling well. She states that she would make an appointment to she her MD. I let the team member of center know and that  client was going to call her MD.  I Will follow up with her next week. If she is no better today [facility] [phone]( note she stated she took her medication this morning)

## 2017-07-10 NOTE — L&D Delivery Note (Signed)
OB/GYN Faculty Practice Delivery Note  Shannon SharpsDenise L Stroope is a 32 y.o. [id number] s/p VBAC at 4829w4d. She was admitted for active labor with severe superimposed preeclampsia.   ROM: rupture date, rupture time, delivery date, or delivery time have not been documented with clear fluid GBS Status: unknown - given part of ampicillin bag Maximum Maternal Temperature: Temp (24hrs), Avg:97.7 F (36.5 C), Min:97.3 F (36.3 C), Max:98 F (36.7 C)  Labor Progress: . Admitted in active labor after arriving to [facility]U by EMS . Mg++ bolus infusing at time of delivery, received BMZ just prior . SROM clear fluid  Delivery Date/Time: 06/05/18 at 0040 Delivery: Called to room and patient was complete and pushing. Head and entire body delivered together. No nuchal cord present.  Infant with spontaneous cry, dried and stimulated. Cord clamped x 2 after 1-minute delay, and cut by provider. Cord blood drawn and cord avulsed while attempting to retrieve cord blood. Placenta removed by manual extraction, was already sitting in vagina. Fundus firm with massage and Pitocin. Labia, perineum, vagina, and cervix inspected inspected with no lacerations.   Placenta: manual removal, intact, 3-vessel cord, multiple calcifications - 2g Ancef given for manual extraction, sent to pathology  Complications: preterm delivery, NICU present Lacerations: none EBL: 50cc Analgesia: none  Postpartum Planning [x]  message to sent to schedule follow-up  [x]  vaccines UTD -- will continue prn BP meds, start enalapril 10mg  daily, continue Mg++ infusion for 24-hours postpartum  -- transfer to antepartum for BP, infant in NICU   Infant: Vigorous female  APGARs 9 at 5-minutes  weight pending   Kaedynce Tapp S. Earlene PlaterWallace, DO OB/GYN Fellow, Faculty Practice

## 2018-02-07 LAB — CYSTIC FIBROSIS MUTATION 97: CYSTIC FIBROSIS MUTAT: NEGATIVE

## 2018-02-07 LAB — OB RESULTS CONSOLE ABO/RH: RH Type: POSITIVE

## 2018-02-07 LAB — OB RESULTS CONSOLE PLATELET COUNT: Platelets: 265

## 2018-02-07 LAB — OB RESULTS CONSOLE RUBELLA ANTIBODY, IGM: Rubella: IMMUNE

## 2018-02-07 LAB — OB RESULTS CONSOLE HEPATITIS B SURFACE ANTIGEN: Hepatitis B Surface Ag: NEGATIVE

## 2018-02-07 LAB — OB RESULTS CONSOLE HGB/HCT, BLOOD
HEMATOCRIT: 34
HEMOGLOBIN: 11

## 2018-02-07 LAB — CYTOLOGY - PAP: PAP SMEAR: NEGATIVE

## 2018-02-07 LAB — CULTURE, OB URINE: Urine Culture, OB: NEGATIVE

## 2018-02-07 LAB — OB RESULTS CONSOLE VARICELLA ZOSTER ANTIBODY, IGG: Varicella: IMMUNE

## 2018-02-07 LAB — OB RESULTS CONSOLE GC/CHLAMYDIA
CHLAMYDIA, DNA PROBE: NEGATIVE
GC PROBE AMP, GENITAL: NEGATIVE

## 2018-02-07 LAB — OB RESULTS CONSOLE ANTIBODY SCREEN: Antibody Screen: NEGATIVE

## 2018-02-26 ENCOUNTER — Encounter: Payer: Self-pay | Admitting: *Deleted

## 2018-02-28 ENCOUNTER — Encounter: Payer: Self-pay | Admitting: Obstetrics & Gynecology

## 2018-02-28 NOTE — BH Specialist Note (Deleted)
Integrated Behavioral Health Initial Visit  MRN: [id number] Name: Shannon Gonzales  Number of Integrated Behavioral Health Clinician visits:: {IBH Number of Visits:21014052} Session Start time: ***  Session End time: *** Total time: {IBH Total Time:21014050}  Type of Service: Integrated Behavioral Health- Individual/Family Interpretor:{yes WU:[id number]}no:314532} Interpretor Name and Language: ***   Warm Hand Off Completed.       SUBJECTIVE: Shannon Gonzales is a 32 y.o. female accompanied by {CHL AMB ACCOMPANIED YN:[id number]}[phone]} Patient was referred by *** for ***. Patient reports the following symptoms/concerns: *** Duration of problem: ***; Severity of problem: {Mild/Moderate/Severe:20260}  OBJECTIVE: Mood: {BHH MOOD:22306} and Affect: {BHH AFFECT:22307} Risk of harm to self or others: {CHL AMB BH Suicide Current Mental Status:21022748}  LIFE CONTEXT: Family and Social: *** School/Work: *** Self-Care: *** Life Changes: ***  GOALS ADDRESSED: Patient will: 1. Reduce symptoms of: {IBH Symptoms:21014056} 2. Increase knowledge and/or ability of: {IBH Patient Tools:21014057}  3. Demonstrate ability to: {IBH Goals:21014053}  INTERVENTIONS: Interventions utilized: {IBH Interventions:21014054}  Standardized Assessments completed: {IBH Screening Tools:21014051}  ASSESSMENT: Patient currently experiencing ***.   Patient may benefit from ***.  PLAN: 1. Follow up with behavioral health clinician on : *** 2. Behavioral recommendations: *** 3. Referral(s): {IBH Referrals:21014055} 4. "From scale of 1-10, how likely are you to follow plan?": ***  Valetta CloseJamie C McMannes, LCSW

## 2018-03-18 ENCOUNTER — Encounter: Payer: Medicaid Other | Admitting: Obstetrics and Gynecology

## 2018-03-18 NOTE — BH Specialist Note (Deleted)
Integrated Behavioral Health Initial Visit  MRN: [id number] Name: Shannon Gonzales  Number of Integrated Behavioral Health Clinician visits:: 1/6 Session Start time: ***  Session End time: *** Total time: {IBH Total Time:21014050}  Type of Service: Integrated Behavioral Health- Individual/Family Interpretor:No. Interpretor Name and Language: n/a   Warm Hand Off Completed.       SUBJECTIVE: Shannon Gonzales is a 32 y.o. female accompanied by {CHL AMB ACCOMPANIED QP:[id number]} Patient was referred by Nettie Elm, MD for Initial OB introduction to integrated behavioral health services  Patient reports the following symptoms/concerns: *** Duration of problem: ***; Severity of problem: {Mild/Moderate/Severe:20260}  OBJECTIVE: Mood: {BHH MOOD:22306} and Affect: {BHH AFFECT:22307} Risk of harm to self or others: {CHL AMB BH Suicide Current Mental Status:21022748}  LIFE CONTEXT: Family and Social: *** School/Work: *** Self-Care: *** Life Changes: Current pregnancy ***  GOALS ADDRESSED: Patient will: 1. Reduce symptoms of: {IBH Symptoms:21014056} 2. Increase knowledge and/or ability of: {IBH Patient Tools:21014057}  3. Demonstrate ability to: {IBH Goals:21014053}  INTERVENTIONS: Interventions utilized: {IBH Interventions:21014054}  Standardized Assessments completed: GAD-7 and PHQ 9  ASSESSMENT: Patient currently experiencing Supervision of *** pregnancy ***.   Patient may benefit from Initial OB introduction to integrated behavioral health services .  PLAN: 1. Follow up with behavioral health clinician on : *** 2. Behavioral recommendations: *** 3. Referral(s): {IBH Referrals:21014055} 4. "From scale of 1-10, how likely are you to follow plan?": ***  Valetta Close Bertha Lokken, LCSW

## 2018-03-28 ENCOUNTER — Encounter: Payer: Self-pay | Admitting: Medical

## 2018-03-28 ENCOUNTER — Ambulatory Visit (INDEPENDENT_AMBULATORY_CARE_PROVIDER_SITE_OTHER): Payer: Medicaid Other | Admitting: Medical

## 2018-03-28 ENCOUNTER — Other Ambulatory Visit (HOSPITAL_COMMUNITY)
Admission: RE | Admit: 2018-03-28 | Discharge: 2018-03-28 | Disposition: A | Discharge status: Home / Self Care | Payer: Medicaid Other | Source: Ambulatory Visit | Attending: Medical | Admitting: Medical

## 2018-03-28 VITALS — BP 137/100 | HR 82 | Ht 61.0 in | Wt 149.0 lb

## 2018-03-28 DIAGNOSIS — O09212 Supervision of pregnancy with history of pre-term labor, second trimester: Secondary | ICD-10-CM | POA: Diagnosis not present

## 2018-03-28 DIAGNOSIS — Z23 Encounter for immunization: Secondary | ICD-10-CM | POA: Diagnosis not present

## 2018-03-28 DIAGNOSIS — O0932 Supervision of pregnancy with insufficient antenatal care, second trimester: Secondary | ICD-10-CM | POA: Diagnosis not present

## 2018-03-28 DIAGNOSIS — F419 Anxiety disorder, unspecified: Secondary | ICD-10-CM

## 2018-03-28 DIAGNOSIS — N898 Other specified noninflammatory disorders of vagina: Secondary | ICD-10-CM

## 2018-03-28 DIAGNOSIS — O10012 Pre-existing essential hypertension complicating pregnancy, second trimester: Secondary | ICD-10-CM

## 2018-03-28 DIAGNOSIS — A599 Trichomoniasis, unspecified: Secondary | ICD-10-CM

## 2018-03-28 DIAGNOSIS — O10013 Pre-existing essential hypertension complicating pregnancy, third trimester: Secondary | ICD-10-CM | POA: Insufficient documentation

## 2018-03-28 DIAGNOSIS — Z98891 History of uterine scar from previous surgery: Secondary | ICD-10-CM | POA: Diagnosis not present

## 2018-03-28 DIAGNOSIS — B9689 Other specified bacterial agents as the cause of diseases classified elsewhere: Secondary | ICD-10-CM

## 2018-03-28 DIAGNOSIS — F17200 Nicotine dependence, unspecified, uncomplicated: Secondary | ICD-10-CM | POA: Diagnosis not present

## 2018-03-28 DIAGNOSIS — N760 Acute vaginitis: Secondary | ICD-10-CM | POA: Diagnosis not present

## 2018-03-28 DIAGNOSIS — O0930 Supervision of pregnancy with insufficient antenatal care, unspecified trimester: Secondary | ICD-10-CM | POA: Insufficient documentation

## 2018-03-28 DIAGNOSIS — O0992 Supervision of high risk pregnancy, unspecified, second trimester: Secondary | ICD-10-CM | POA: Diagnosis not present

## 2018-03-28 DIAGNOSIS — F1290 Cannabis use, unspecified, uncomplicated: Secondary | ICD-10-CM | POA: Diagnosis not present

## 2018-03-28 DIAGNOSIS — B3731 Acute candidiasis of vulva and vagina: Secondary | ICD-10-CM

## 2018-03-28 DIAGNOSIS — O09892 Supervision of other high risk pregnancies, second trimester: Secondary | ICD-10-CM | POA: Insufficient documentation

## 2018-03-28 DIAGNOSIS — O0933 Supervision of pregnancy with insufficient antenatal care, third trimester: Secondary | ICD-10-CM | POA: Insufficient documentation

## 2018-03-28 DIAGNOSIS — N949 Unspecified condition associated with female genital organs and menstrual cycle: Secondary | ICD-10-CM

## 2018-03-28 DIAGNOSIS — B373 Candidiasis of vulva and vagina: Secondary | ICD-10-CM

## 2018-03-28 LAB — POCT URINALYSIS DIP (DEVICE)
Bilirubin Urine: NEGATIVE
GLUCOSE, UA: NEGATIVE mg/dL
Ketones, ur: NEGATIVE mg/dL
Leukocytes, UA: NEGATIVE
Nitrite: NEGATIVE
Protein, ur: NEGATIVE mg/dL
Specific Gravity, Urine: 1.025 (ref 1.005–1.030)
UROBILINOGEN UA: 0.2 mg/dL (ref 0.0–1.0)
pH: 6 (ref 5.0–8.0)

## 2018-03-28 MED ORDER — COMFORT FIT MATERNITY SUPP LG MISC: 1.0000 [IU] | Freq: Every day | 0 refills | Status: DC | PRN

## 2018-03-28 MED ORDER — PRENATAL + COMPLETE MULTI 18-0.8 & 290 MG PO THPK: 1.0000 | PACK | Freq: Every day | ORAL | 6 refills | Status: DC

## 2018-03-28 NOTE — Patient Instructions (Addendum)
Prenatal Care WHAT IS PRENATAL CARE? Prenatal care is the process of caring for a pregnant woman before she gives birth. Prenatal care makes sure that she and her baby remain as healthy as possible throughout pregnancy. Prenatal care may be provided by a midwife, family practice health care provider, or a childbirth and pregnancy specialist (obstetrician). Prenatal care may include physical examinations, testing, treatments, and education on nutrition, lifestyle, and social support services. WHY IS PRENATAL CARE SO IMPORTANT? Early and consistent prenatal care increases the chance that you and your baby will remain healthy throughout your pregnancy. This type of care also decreases a baby's risk of being born too early (prematurely), or being born smaller than expected (small for gestational age). Any underlying medical conditions you may have that could pose a risk during your pregnancy are discussed during prenatal care visits. You will also be monitored regularly for any new conditions that may arise during your pregnancy so they can be treated quickly and effectively. WHAT HAPPENS DURING PRENATAL CARE VISITS? Prenatal care visits may include the following: Discussion Tell your health care provider about any new signs or symptoms you have experienced since your last visit. These might include:  Nausea or vomiting.  Increased or decreased level of energy.  Difficulty sleeping.  Back or leg pain.  Weight changes.  Frequent urination.  Shortness of breath with physical activity.  Changes in your skin, such as the development of a rash or itchiness.  Vaginal discharge or bleeding.  Feelings of excitement or nervousness.  Changes in your baby's movements.  You may want to write down any questions or topics you want to discuss with your health care provider and bring them with you to your appointment. Examination During your first prenatal care visit, you will likely have a complete  physical exam. Your health care provider will often examine your vagina, cervix, and the position of your uterus, as well as check your heart, lungs, and other body systems. As your pregnancy progresses, your health care provider will measure the size of your uterus and your baby's position inside your uterus. He or she may also examine you for early signs of labor. Your prenatal visits may also include checking your blood pressure and, after about 10-12 weeks of pregnancy, listening to your baby's heartbeat. Testing Regular testing often includes:  Urinalysis. This checks your urine for glucose, protein, or signs of infection.  Blood count. This checks the levels of white and red blood cells in your body.  Tests for sexually transmitted infections (STIs). Testing for STIs at the beginning of pregnancy is routinely done and is required in many states.  Antibody testing. You will be checked to see if you are immune to certain illnesses, such as rubella, that can affect a developing fetus.  Glucose screen. Around 24-28 weeks of pregnancy, your blood glucose level will be checked for signs of gestational diabetes. Follow-up tests may be recommended.  Group B strep. This is a bacteria that is commonly found inside a woman's vagina. This test will inform your health care provider if you need an antibiotic to reduce the amount of this bacteria in your body prior to labor and childbirth.  Ultrasound. Many pregnant women undergo an ultrasound screening around 18-20 weeks of pregnancy to evaluate the health of the fetus and check for any developmental abnormalities.  HIV (human immunodeficiency virus) testing. Early in your pregnancy, you will be screened for HIV. If you are at high risk for HIV, this test may   be repeated during your third trimester of pregnancy.  You may be offered other testing based on your age, personal or family medical history, or other factors. HOW OFTEN SHOULD I PLAN TO SEE MY  HEALTH CARE PROVIDER FOR PRENATAL CARE? Your prenatal care check-up schedule depends on any medical conditions you have before, or develop during, your pregnancy. If you do not have any underlying medical conditions, you will likely be seen for checkups:  Monthly, during the first 6 months of pregnancy.  Twice a month during months 7 and 8 of pregnancy.  Weekly starting in the 9th month of pregnancy and until delivery.  If you develop signs of early labor or other concerning signs or symptoms, you may need to see your health care provider more often. Ask your health care provider what prenatal care schedule is best for you. WHAT CAN I DO TO KEEP MYSELF AND MY BABY AS HEALTHY AS POSSIBLE DURING MY PREGNANCY?  Take a prenatal vitamin containing 400 micrograms (0.4 mg) of folic acid every day. Your health care provider may also ask you to take additional vitamins such as iodine, vitamin D, iron, copper, and zinc.  Take 1500-2000 mg of calcium daily starting at your 20th week of pregnancy until you deliver your baby.  Make sure you are up to date on your vaccinations. Unless directed otherwise by your health care provider: ? You should receive a tetanus, diphtheria, and pertussis (Tdap) vaccination between the 27th and 36th week of your pregnancy, regardless of when your last Tdap immunization occurred. This helps protect your baby from whooping cough (pertussis) after he or she is born. ? You should receive an annual inactivated influenza vaccine (IIV) to help protect you and your baby from influenza. This can be done at any point during your pregnancy.  Eat a well-rounded diet that includes: ? Fresh fruits and vegetables. ? Lean proteins. ? Calcium-rich foods such as milk, yogurt, hard cheeses, and dark, leafy greens. ? Whole grain breads.  Do noteat seafood high in mercury, including: ? Swordfish. ? Tilefish. ? Shark. ? King mackerel. ? More than 6 oz tuna per week.  Do not  eat: ? Raw or undercooked meats or eggs. ? Unpasteurized foods, such as soft cheeses (brie, blue, or feta), juices, and milks. ? Lunch meats. ? Hot dogs that have not been heated until they are steaming.  Drink enough water to keep your urine clear or pale yellow. For many women, this may be 10 or more 8 oz glasses of water each day. Keeping yourself hydrated helps deliver nutrients to your baby and may prevent the start of pre-term uterine contractions.  Do not use any tobacco products including cigarettes, chewing tobacco, or electronic cigarettes. If you need help quitting, ask your health care provider.  Do not drink beverages containing alcohol. No safe level of alcohol consumption during pregnancy has been determined.  Do not use any illegal drugs. These can harm your developing baby or cause a miscarriage.  Ask your health care provider or pharmacist before taking any prescription or over-the-counter medicines, herbs, or supplements.  Limit your caffeine intake to no more than 200 mg per day.  Exercise. Unless told otherwise by your health care provider, try to get 30 minutes of moderate exercise most days of the week. Do not  do high-impact activities, contact sports, or activities with a high risk of falling, such as horseback riding or downhill skiing.  Get plenty of rest.  Avoid anything that raises your  body temperature, such as hot tubs and saunas.  If you own a cat, do not empty its litter box. Bacteria contained in cat feces can cause an infection called toxoplasmosis. This can result in serious harm to the fetus.  Stay away from chemicals such as insecticides, lead, mercury, and cleaning or paint products that contain solvents.  Do not have any X-rays taken unless medically necessary.  Take a childbirth and breastfeeding preparation class. Ask your health care provider if you need a referral or recommendation.  This information is not intended to replace advice given  to you by your health care provider. Make sure you discuss any questions you have with your health care provider. Document Released: 06/29/2003 Document Revised: 11/29/2015 Document Reviewed: 09/10/2013 Elsevier Interactive Patient Education  2017 [place] Education Options: [facility] Department Classes:  Childbirth education classes can help you get ready for a positive parenting experience. You can also meet other expectant parents and get free stuff for your baby. Each class runs for five weeks on the same night and costs $45 for the mother-to-be and her support person. Medicaid covers the cost if you are eligible. Call [phone] to register. [facility] Childbirth Education:  [phone] or [phone] or [email or web address]  Baby & Me Class: Discuss newborn & infant parenting and family adjustment issues with other new mothers in a relaxed environment. Each week brings a new speaker or baby-centered activity. We encourage new mothers to join [place] every Thursday at 11:00am. Babies birth until crawling. No registration or fee. Daddy [employer]: This course offers Dads-to-be the tools and knowledge needed to feel confident on their journey to becoming new fathers. Experienced dads, who have been trained as coaches, teach dads-to-be how to hold, comfort, diaper, swaddle and play with their infant while being able to support the new mom as well. A class for men taught by men. $25/dad Big Brother/Big Sister: Let your children share in the joy of a new brother or sister in this special class designed just for them. Class includes discussion about how families care for babies: swaddling, holding, diapering, safety as well as how they can be helpful in their new role. This class is designed for children ages 43 to 60, but any age is welcome. Please register each child individually. $5/child  Mom Talk: This mom-led group offers support and connection to mothers as  they journey through the adjustments and struggles of that sometimes overwhelming first year after the birth of a child. Tuesdays at 10:00am and Thursdays at 6:00pm. Babies welcome. No registration or fee. Breastfeeding Support Group: This group is a mother-to-mother support circle where moms have the opportunity to share their breastfeeding experiences. A Lactation Consultant is present for questions and concerns. Meets each Tuesday at 11:00am. No fee or registration. Breastfeeding Your Baby: Learn what to expect in the first days of breastfeeding your newborn.  This class will help you feel more confident with the skills needed to begin your breastfeeding experience. Many new mothers are concerned about breastfeeding after leaving the hospital. This class will also address the most common fears and challenges about breastfeeding during the first few weeks, months and beyond. (call for fee) Comfort Techniques and Tour: This 2 hour interactive class will provide you the opportunity to learn & practice hands-on techniques that can help relieve some of the discomfort of labor and encourage your baby to rotate toward the best position for birth. You and your partner will be able to try  a variety of labor positions with birth balls and rebozos as well as practice breathing, relaxation, and visualization techniques. A tour of the [facility] is included with this class. $20 per registrant and support person Childbirth Class- Weekend Option: This class is a Weekend version of our Birth & Baby series. It is designed for parents who have a difficult time fitting several weeks of classes into their schedule. It covers the care of your newborn and the basics of labor and childbirth. It also includes a [place] [facility] and lunch. The class is held two consecutive days: beginning on Friday evening from 6:30 - 8:30 p.m. and the next day, Saturday from 9 a.m. - 4 p.m.  (call for fee) Doren Custard Class: Interested in a waterbirth?  This informational class will help you discover whether waterbirth is the right fit for you. Education about waterbirth itself, supplies you would need and how to assemble your support team is what you can expect from this class. Some obstetrical practices require this class in order to pursue a waterbirth. (Not all obstetrical practices offer waterbirth-check with your healthcare provider.) Register only the expectant mom, but you are encouraged to bring your partner to class! Required if planning waterbirth, no fee. Infant/Child CPR: Parents, grandparents, babysitters, and friends learn Cardio-Pulmonary Resuscitation skills for infants and children. You will also learn how to treat both conscious and unconscious choking in infants and children. This Family & Friends program does not offer certification. Register each participant individually to ensure that enough mannequins are available. (Call for fee) Grandparent Love: Expecting a grandbaby? This class is for you! Learn about the latest infant care and safety recommendations and ways to support your own child as he or she transitions into the parenting role. Taught by Registered Nurses who are childbirth instructors, but most importantly...they are grandmothers too! $10/person. Childbirth Class- Natural Childbirth: This series of 5 weekly classes is for expectant parents who want to learn and practice natural methods of coping with the process of labor and childbirth. Relaxation, breathing, massage, visualization, role of the partner, and helpful positioning are highlighted. Participants learn how to be confident in their body's ability to give birth. This class will empower and help parents make informed decisions about their own care. Includes discussion that will help new parents transition into the immediate postpartum period. [facility] is included. We  suggest taking this class between 25-32 weeks, but it's only a recommendation. $75 per registrant and one support person or $30 Medicaid. Childbirth Class- 3 week Series: This option of 3 weekly classes helps you and your labor partner prepare for childbirth. Newborn care, labor & birth, cesarean birth, pain management, and comfort techniques are discussed and a [place] [facility] is included. The class meets at the same time, on the same day of the week for 3 consecutive weeks beginning with the starting date you choose. $60 for registrant and one support person.  Marvelous Multiples: Expecting twins, triplets, or more? This class covers the differences in labor, birth, parenting, and breastfeeding issues that face multiples' parents. NICU tour is included. Led by a Certified Childbirth Educator who is the mother of twins. No fee. Caring for Baby: This class is for expectant and adoptive parents who want to learn and practice the most up-to-date newborn care for their babies. Focus is on birth through the first six weeks of life. Topics include feeding, bathing, diapering,  crying, umbilical cord care, circumcision care and safe sleep. Parents learn to recognize symptoms of illness and when to call the pediatrician. Register only the mom-to-be and your partner or support person can plan to come with you! $10 per registrant and support person Childbirth Class- online option: This online class offers you the freedom to complete a Birth and Baby series in the comfort of your own home. The flexibility of this option allows you to review sections at your own pace, at times convenient to you and your support people. It includes additional video information, animations, quizzes, and extended activities. Get organized with helpful eClass tools, checklists, and trackers. Once you register online for the class, you will receive an email within a few days to accept the invitation and begin the  class when the time is right for you. The content will be available to you for 60 days. $60 for 60 days of online access for you and your support people.  Local Doulas: Natural Baby Doulas [email or web address] Tel: [phone] [email or web address] [employer] [phone] [email or web address] www.[email or web address] The Labor Hassell Halim  (also do waterbirth tub rental) [phone] [email or web address] Triad Birth Doula [phone] [email or web address] NotebookDistributors.fi Sacred Rhythms  [phone] https://[email or web address]-[email or web address]/ [employer] (PADA) [email or web address] https://www.[email or web address]/ [place] Birth and Baby  http://[email or web address]/ Considering Waterbirth? Guide for patients at [facility] [employer]  Why consider waterbirth?  . Gentle birth for babies . Less pain medicine used in labor . May allow for passive descent/less pushing . May reduce perineal tears  . More mobility and instinctive maternal position changes . Increased maternal relaxation . Reduced blood pressure in labor  Is waterbirth safe? What are the risks of infection, drowning or other complications?  . Infection: o Very low risk (3.7 % for tub vs 4.8% for bed) o 7 in 8000 waterbirths with documented infection o Poorly cleaned equipment most common cause o Slightly lower group B strep transmission rate  . Drowning o Maternal:  - Very low risk   - Related to seizures or fainting o Newborn:  - Very low risk. No evidence of increased risk of respiratory problems in multiple large studies - Physiological protection from breathing under water - Avoid underwater birth if there are any fetal complications - Once baby's head is out of the water, keep it out.  . Birth complication o Some reports of cord trauma, but risk decreased by bringing baby to surface  gradually o No evidence of increased risk of shoulder dystocia. Mothers can usually change positions faster in water than in a bed, possibly aiding the maneuvers to free the shoulder.   You must attend a Doren Custard class at [facility]  3rd Wednesday of every month from 7-9pm  [employer] by calling [phone] or online at [email or web address]  Bring [place] the certificate from the class to your prenatal appointment  Meet with a midwife at 36 weeks to see if you can still plan a waterbirth and to sign the consent.   Purchase or rent the following supplies:   Water Birth Pool (Birth Pool in a Box or [place] for instance)  (Tubs start ~$125)  Single-use disposable tub liner designed for your brand of tub  New garden hose labeled "lead-free", "suitable for drinking water",  Electric drain pump to remove water (We recommend 792 gallon per hour or greater pump.)   Separate garden hose to remove the dirty water  Fish net  Bathing suit top (optional)  Long-handled mirror (optional)  Places to purchase or rent supplies  GotWebTools.is for tub purchases and supplies  Waterbirthsolutions.com for tub purchases and supplies  The Labor Ladies (www.[email or web address]) $275 for tub rental/set-up & take down/kit   [employer] (http://www.fleming.com/.htm) Information regarding doulas (labor support) who provide pool rentals  Our practice has a Birth Pool in a Box tub at the hospital that you may borrow on a first-come-first-served basis. It is your responsibility to to set up, clean and break down the tub. We cannot guarantee the availability of this tub in advance. You are responsible for bringing all accessories listed above. If you do not have all necessary supplies you cannot have a waterbirth.    Things that would prevent you from having a waterbirth:  Premature, <37wks  Previous cesarean birth  Presence of thick meconium-stained  fluid  Multiple gestation (Twins, triplets, etc.)  Uncontrolled diabetes or gestational diabetes requiring medication  Hypertension requiring medication or diagnosis of pre-eclampsia  Heavy vaginal bleeding  Non-reassuring fetal heart rate  Active infection (MRSA, etc.). Group B Strep is NOT a contraindication for  waterbirth.  If your labor has to be induced and induction method requires continuous  monitoring of the baby's heart rate  Other risks/issues identified by your obstetrical provider  Please remember that birth is unpredictable. Under certain unforeseeable circumstances your provider may advise against giving birth in the tub. These decisions will be made on a case-by-case basis and with the safety of you and your baby as our highest priority.     AREA PEDIATRIC/FAMILY [place] [address] [place], [place]  [address] Phone - [phone]   Fax - [phone]  [facility] [place] [address] [place], [place] [address] Phone - [phone]   Fax - [place] [address] [place], [place]  [address] Phone - [phone]   Fax - [phone]  [place]. [address] [place] [address] [place], [place]  [address] Phone - [phone]   Fax - [phone]  [place] [address] [place], [place]  [address] Phone - [phone]   Fax - [phone]  [facility] [address] [place], [place]  [address] Phone - [phone]   Fax - [place] [address] [place], [place]  [address] Phone - [phone]   Fax - [phone]  [place] [address] [place], [place] [address] [place], [place]  [address] Phone - [phone]   Fax - [place] [address] [place], [place]  [address] Phone - [phone]    Fax - [phone] [facility] [place]. [address] [place], [place]  [address] Phone - [phone]   Fax - [phone]  [place] [address] [place], [place]  [address] Phone - [phone]   Fax - [phone]  [facility] AT [place], [place], [place]  [address] Phone - [phone]   Fax - [place] [address], [place], [place]  [address] Phone - [phone]   Fax - [phone]  [facility] [address] [place], [place]  [address] Phone - [place],   [address] Phone - [phone]   Fax - [place] [address], [place],   [address] Phone -  [phone]   Fax - [phone]  [place] [address] [place], [place]  [address] Phone - [phone]   Fax - [place], [place]  [address] Phone - [phone]   Fax - [place], [place], [place]  [address] Phone - [phone]   Fax - [place] [address] [place], [place]  [address] Phone - [phone]   Fax - [phone]  [place] [address] [place], [place]  [address] Phone - [phone]   Fax - [place] [address], [place], [place]  [address] Phone - [phone]   Fax - [phone]  [place] [address] [place], [place]  [address] Phone - [phone]   Fax - [phone] [place] [address] [place], [place]  [address] Phone - [phone]   Fax - [place] [address] [place], [place]  [address] Phone - [phone]   Fax -  [place] [address] [place],   [address] Phone - [phone]   Fax - [phone]  O[place] MD [address] [place] [address] Phone [phone]  Fax [phone]  Safe Medications in Pregnancy   Acne:  Benzoyl Peroxide  Salicylic Acid   Backache/Headache:  Tylenol: 2 regular strength every 4 hours OR        2 Extra strength every 6 hours   Colds/Coughs/Allergies:  Benadryl (alcohol free) 25 mg every 6 hours as needed  Breath right strips  Claritin  Cepacol throat lozenges  Chloraseptic throat spray  Cold-Eeze- up to three times per day  Cough drops, alcohol free  Flonase (by prescription only)  Guaifenesin  Mucinex  Robitussin DM (plain only, alcohol free)  Saline nasal spray/drops  Sudafed (pseudoephedrine) & Actifed * use only after [redacted] weeks gestation and if you do not have high blood pressure  Tylenol  Vicks Vaporub  Zinc lozenges  Zyrtec   Constipation:  Colace  Ducolax suppositories  Fleet enema  Glycerin suppositories  Metamucil  Milk of magnesia  Miralax  Senokot  Smooth move tea   Diarrhea:  Kaopectate  Imodium A-D   *NO pepto Bismol   Hemorrhoids:  Anusol  Anusol HC  Preparation H  Tucks   Indigestion:  Tums  Maalox  Mylanta  Zantac  Pepcid   Insomnia:  Benadryl (alcohol free) 45m every 6 hours as needed  Tylenol PM  Unisom, no Gelcaps   Leg Cramps:  Tums  MagGel   Nausea/Vomiting:  Bonine  Dramamine  Emetrol  Ginger extract  Sea bands  Meclizine  Nausea medication to take during pregnancy:  Unisom (doxylamine succinate 25 mg tablets) Take one tablet daily at bedtime. If symptoms are not adequately controlled, the dose can be increased to a maximum recommended dose of two tablets daily (1/2 tablet in the morning, 1/2 tablet mid-afternoon and one at bedtime).  Vitamin B6 1042mtablets.  Take one tablet twice a day (up to 200 mg per day).   Skin Rashes:  Aveeno products  Benadryl cream or 2570mvery 6 hours as needed  Calamine Lotion  1% cortisone cream   Yeast infection:  Gyne-lotrimin 7  Monistat 7    **If taking multiple medications, please check labels to avoid  duplicating the same active ingredients  **take medication as directed on the label  ** Do not exceed 4000 mg of tylenol in 24 hours  **Do not take medications that contain aspirin or ibuprofen           Hypertension During Pregnancy Hypertension is also called high blood pressure. High blood pressure means that the force of your blood moving in your body is too strong. When you are pregnant, this condition should be watched carefully. It can cause problems for you and your baby. Follow these instructions at home: Eating and drinking  Drink enough fluid to keep your pee (urine) clear or pale yellow.  Eat healthy foods that are low in salt (sodium). ? Do not add salt to your food. ? Check labels on foods and drinks to see much salt is in them. Look on the label where you see "Sodium." Lifestyle  Do not use any products that contain nicotine or tobacco, such as cigarettes and e-cigarettes. If you need help quitting, ask your doctor.  Do not use alcohol.  Avoid caffeine.  Avoid stress. Rest and get plenty of sleep. General instructions  Take over-the-counter and prescription medicines only as told by your doctor.  While lying down, lie on your left side. This keeps pressure off your baby.  While sitting or lying down, raise (elevate) your feet. Try putting some pillows under your lower legs.  Exercise regularly. Ask your doctor what kinds of exercise are best for you.  Keep all prenatal and follow-up visits as told by your doctor. This is important. Contact a doctor if:  You have symptoms that your doctor told you to watch for, such as: ? Fever. ? Throwing up  (vomiting). ? Headache. Get help right away if:  You have very bad pain in your belly (abdomen).  You are throwing up, and this does not get better with treatment.  You suddenly get swelling in your hands, ankles, or face.  You gain 4 lb (1.8 kg) or more in 1 week.  You get bleeding from your vagina.  You have blood in your pee.  You do not feel your baby moving as much as normal.  You have a change in vision.  You have muscle twitching or sudden tightening (spasms).  You have trouble breathing.  Your lips or fingernails turn blue. This information is not intended to replace advice given to you by your health care provider. Make sure you discuss any questions you have with your health care provider. Document Released: 07/29/2010 Document Revised: 03/07/2016 Document Reviewed: 03/07/2016 Elsevier Interactive Patient Education  2018 [employer] SUPPORT BELT: You are not alone, Seventy-five percent of women have some sort of abdominal or back pain at some point in their pregnancy. Your baby is growing at a fast pace, which means that your whole body is rapidly trying to adjust to the changes. As your uterus grows, your back may start feeling a bit under stress and this can result in back or abdominal pain that can go from mild, and therefore bearable, to severe pains that will not allow you to sit or lay down comfortably, When it comes to dealing with pregnancy-related pains and cramps, some pregnant women usually prefer natural remedies, which the market is filled with nowadays. For example, wearing a pregnancy support belt can help ease and lessen your discomfort and pain. WHAT ARE THE BENEFITS OF WEARING A PREGNANCY SUPPORT BELT? A pregnancy support belt provides support to the  lower portion of the belly taking some of the weight of the growing uterus and distributing to the other parts of your body. It is designed make you comfortable and gives you extra support.  Over the years, the pregnancy apparel market has been studying the needs and wants of pregnant women and they have come up with the most comfortable pregnancy support belts that woman could ever ask for. In fact, you will no longer have to wear a stretched-out or bulky pregnancy belt that is visible underneath your clothes and makes you feel even more uncomfortable. Nowadays, a pregnancy support belt is made of comfortable and stretchy materials that will not irritate your skin but will actually make you feel at ease and you will not even notice you are wearing it. They are easy to put on and adjust during the day and can be worn at night for additional support.  BENEFITS: . Relives Back pain . Relieves Abdominal Muscle and Leg Pain . Stabilizes the Pelvic Ring . Offers a Cushioned Abdominal Lift Pad . Relieves pressure on the Sciatic Nerve Within Minutes WHERE TO GET YOUR PREGNANCY BELT: [employer] [phone]  [place], [place] [address]

## 2018-03-28 NOTE — Progress Notes (Signed)
PRENATAL VISIT NOTE  Subjective:  Shannon Gonzales is a 32 y.o. [phone] at 67w5dbeing seen today for initial prenatal care visit [facility]  She is currently monitored for the following issues for this high-risk pregnancy and has Hypertension in pregnancy, essential, antepartum, second trimester; History of preterm delivery, currently pregnant in second trimester; Supervision of high risk pregnancy, antepartum, second trimester; Late prenatal care affecting pregnancy in second trimester; History of cesarean section, low transverse; Anxiety; Smoker; and Marijuana use on their problem list.  Patient reports backache and occasional contractions.  Contractions: Irritability. Vag. Bleeding: None.  Movement: Present. Denies leaking of fluid.   The following portions of the patient's history were reviewed and updated as appropriate: allergies, current medications, past family history, past medical history, past social history, past surgical history and problem list. Problem list updated.  Objective:   Vitals:   03/28/18 1038 03/28/18 1041  BP: (!) 137/100   Pulse: 82   Weight: 149 lb (67.6 kg)   Height:  _0  (1.549 m)    Fetal Status: Fetal Heart Rate (bpm): 154   Movement: Present     General:  Alert, oriented and cooperative. Patient is in no acute distress.  Skin: Skin is warm and dry. No rash noted.   Cardiovascular: Normal heart rate noted  Respiratory: Normal respiratory effort, no problems with respiration noted  Abdomen: Soft, gravid, appropriate for gestational age.  Pain/Pressure: Present     Pelvic: Cervical exam performed Dilation: Closed Effacement (%): Thick    Extremities: Normal range of motion.  Edema: None  Mental Status: Normal mood and affect. Normal behavior. Normal judgment and thought content.   Assessment and Plan:  Pregnancy: [id number] 250w5d1. Supervision of high risk pregnancy, antepartum, second trimester - [place]  OB DETAIL +14 WK; scheduled 9/25 - Prenat-FePro-Methf-Ca-Omega 3 (PRENATAL + COMPLETE MULTI) 18-0.8 & 290 MG THPK; Take 1 tablet by mouth daily.  Dispense: 60 each; Refill: 6 - SMN1 Copy Number Analysis - Genetic Screening - Flu Vaccine QUAD 36+ mos IM (Fluarix, Quad PF) - Other prenatal labs [facility]   2. Hypertension in pregnancy, essential, antepartum, second trimester - Comp Met (CMET) - Urine protein/creatinine  - [facility]. Advised to start Labetalol as prescribed, patient agrees - Discussed starting 81 mg ASA daily as soon as possible   3. History of preterm delivery, currently pregnant in second trimester - 28 week IUFD with LTCS - 28 week SVD - 34 week SVD - Has had 1 full term SVD as well - Discussed with Dr. PrKennon Roundsrecommends offering 17-P weekly. Patient counseled and agreed with plan of care. Ordered from compound pharmacy today.   4. Late prenatal care affecting pregnancy in second trimester - [facility], first visit here at 2164w5d LMP  5. History of cesarean section, low transverse - 3 successful VBACs since C/S - Plan for TOLAC, will need consent at 28 weeks  6. Anxiety - Ambulatory referral to [facility] for sleep aid, has tried OTC options  7. Smoker - Smoking and tobacco cessation was discussed at today's visit for 3 minutes   8. Vagina itching - Cervicovaginal ancillary only  9. Round ligament pain - Elastic Bandages & Supports (COMFORT FIT MATERNITY SUPP LG) MISC; 1 Units by Does not apply route daily as needed.  Dispense: 1 each; Refill: 0 - Discussed Tylenol PRN, moderation of activity, hydrotherapy and limiting heavy lifting   10. Marijuana use - Was daily for N/V, now states infrequent use - Refused to sign consent for UDS today   Preterm labor symptoms and general obstetric precautions including but not limited to vaginal  bleeding, contractions, leaking of fluid and fetal movement were reviewed in detail with the patient. Please refer to After Visit Summary for other counseling recommendations.  Return in about 4 weeks (around 04/25/2018) for [facility] with MD. Also, return for BP check on 9/25 prior to [place] appointment.   Future Appointments  Date Time Provider [place]  04/03/2018 10:15 AM [facility] [place] 4 WH-MFCUS MFC-US    Kerry Hough, PA-C

## 2018-03-29 LAB — PROTEIN / CREATININE RATIO, URINE
CREATININE, UR: 166.9 mg/dL
PROTEIN/CREAT RATIO: 93 mg/g{creat} (ref 0–200)
Protein, Ur: 15.5 mg/dL

## 2018-03-29 LAB — CERVICOVAGINAL ANCILLARY ONLY
Bacterial vaginitis: POSITIVE — AB
CANDIDA VAGINITIS: POSITIVE — AB
Trichomonas: POSITIVE — AB

## 2018-03-29 MED ORDER — METRONIDAZOLE 500 MG PO TABS: 500.0000 mg | ORAL_TABLET | Freq: Two times a day (BID) | ORAL | 0 refills | Status: DC

## 2018-03-29 MED ORDER — TERCONAZOLE 0.8 % VA CREA: 1.0000 | TOPICAL_CREAM | Freq: Every day | VAGINAL | 0 refills | Status: DC

## 2018-03-29 NOTE — Addendum Note (Signed)
Addended by: Marny LowensteinWENZEL, Marcellius Montagna N on: 03/29/2018 03:46 PM   Modules accepted: Orders

## 2018-04-01 ENCOUNTER — Encounter: Payer: Self-pay | Admitting: *Deleted

## 2018-04-03 ENCOUNTER — Ambulatory Visit: Payer: Medicaid Other

## 2018-04-03 ENCOUNTER — Other Ambulatory Visit (HOSPITAL_COMMUNITY): Payer: Self-pay | Admitting: *Deleted

## 2018-04-03 ENCOUNTER — Telehealth: Payer: Self-pay | Admitting: *Deleted

## 2018-04-03 ENCOUNTER — Ambulatory Visit (HOSPITAL_COMMUNITY)
Admission: RE | Admit: 2018-04-03 | Discharge: 2018-04-03 | Disposition: A | Discharge status: Home / Self Care | Payer: Medicaid Other | Source: Ambulatory Visit | Attending: Medical | Admitting: Medical

## 2018-04-03 ENCOUNTER — Encounter (HOSPITAL_COMMUNITY): Payer: Self-pay

## 2018-04-03 DIAGNOSIS — Z362 Encounter for other antenatal screening follow-up: Secondary | ICD-10-CM

## 2018-04-03 DIAGNOSIS — O0992 Supervision of high risk pregnancy, unspecified, second trimester: Secondary | ICD-10-CM

## 2018-04-03 DIAGNOSIS — O34219 Maternal care for unspecified type scar from previous cesarean delivery: Secondary | ICD-10-CM | POA: Diagnosis not present

## 2018-04-03 DIAGNOSIS — O09892 Supervision of other high risk pregnancies, second trimester: Secondary | ICD-10-CM

## 2018-04-03 DIAGNOSIS — Z3A22 22 weeks gestation of pregnancy: Secondary | ICD-10-CM | POA: Diagnosis not present

## 2018-04-03 DIAGNOSIS — O99332 Smoking (tobacco) complicating pregnancy, second trimester: Secondary | ICD-10-CM | POA: Insufficient documentation

## 2018-04-03 DIAGNOSIS — O09212 Supervision of pregnancy with history of pre-term labor, second trimester: Secondary | ICD-10-CM | POA: Diagnosis not present

## 2018-04-03 DIAGNOSIS — O0932 Supervision of pregnancy with insufficient antenatal care, second trimester: Secondary | ICD-10-CM | POA: Diagnosis present

## 2018-04-03 DIAGNOSIS — Z363 Encounter for antenatal screening for malformations: Secondary | ICD-10-CM

## 2018-04-03 DIAGNOSIS — O10012 Pre-existing essential hypertension complicating pregnancy, second trimester: Secondary | ICD-10-CM | POA: Diagnosis not present

## 2018-04-03 HISTORY — DX: Essential (primary) hypertension: I10

## 2018-04-03 NOTE — ED Notes (Signed)
Spoke with Shannon BlendLinda Zeyfang, [place] [facility].  Pt to have BP nurse check on Friday 04/05/18 @10  [facility]   [name]  Pt reports she didn't have time to take her morning dose of labetalol.  Pt instructed by Dr. Grace BushyBooker to take scheduled labetalol as instructed and prior to coming into her appt's is very important.  Pt denies any HA, vision changes, epigastric pain, or edema.  All future appointments given to pt/FOB.  Pt verbalized understanding.

## 2018-04-03 NOTE — Telephone Encounter (Signed)
Received call from [facility] , pateint there for [place] and they saw she had nurse visit for bp check with [place]. Her BP is elevated and plan to sent to [facility]. I verified she does not need to come to [place], can be sent to [facility]. I called Dr.Wouk and notified.

## 2018-04-04 ENCOUNTER — Telehealth: Payer: Self-pay | Admitting: *Deleted

## 2018-04-04 ENCOUNTER — Encounter: Payer: Self-pay | Admitting: Medical

## 2018-04-04 DIAGNOSIS — O43193 Other malformation of placenta, third trimester: Secondary | ICD-10-CM | POA: Insufficient documentation

## 2018-04-04 LAB — COMPREHENSIVE METABOLIC PANEL
ALT: 11 IU/L (ref 0–32)
AST: 27 IU/L (ref 0–40)
Albumin/Globulin Ratio: 1.7 (ref 1.2–2.2)
Albumin: 3.8 g/dL (ref 3.5–5.5)
Alkaline Phosphatase: 63 IU/L (ref 39–117)
BUN/Creatinine Ratio: 18 (ref 9–23)
BUN: 7 mg/dL (ref 6–20)
Bilirubin Total: <0.2 mg/dL (ref 0.0–1.2)
CO2: 20 mmol/L (ref 20–29)
Calcium: 9.2 mg/dL (ref 8.7–10.2)
Chloride: 101 mmol/L (ref 96–106)
Creatinine, Ser: 0.4 mg/dL — ABNORMAL LOW (ref 0.57–1.00)
GFR calc Af Amer: 161 mL/min/{1.73_m2} (ref >59)
GFR calc non Af Amer: 139 mL/min/{1.73_m2} (ref >59)
Globulin, Total: 2.3 g/dL (ref 1.5–4.5)
Glucose: 58 mg/dL — ABNORMAL LOW (ref 65–99)
POTASSIUM: 4 mmol/L (ref 3.5–5.2)
Sodium: 134 mmol/L (ref 134–144)
TOTAL PROTEIN: 6.1 g/dL (ref 6.0–8.5)

## 2018-04-04 LAB — SMN1 COPY NUMBER ANALYSIS (SMA CARRIER SCREENING)

## 2018-04-04 NOTE — Telephone Encounter (Signed)
Noted Shannon Gonzales had arrived, called and left message notifiying her that her medicine is here- will address at your appointment tomorrow.Call if questions. Note put on nurse visit for tomorrow for bp check to start 17p.

## 2018-04-05 ENCOUNTER — Ambulatory Visit: Payer: Medicaid Other

## 2018-04-15 ENCOUNTER — Encounter: Payer: Self-pay | Admitting: *Deleted

## 2018-04-19 ENCOUNTER — Observation Stay (HOSPITAL_COMMUNITY)
Admission: EM | Admit: 2018-04-19 | Discharge: 2018-04-20 | Disposition: A | Discharge status: Home / Self Care | Payer: Medicaid Other | Attending: Obstetrics & Gynecology | Admitting: Obstetrics & Gynecology

## 2018-04-19 ENCOUNTER — Other Ambulatory Visit: Payer: Self-pay

## 2018-04-19 ENCOUNTER — Encounter (HOSPITAL_COMMUNITY): Payer: Self-pay | Admitting: Emergency Medicine

## 2018-04-19 DIAGNOSIS — Z3A26 26 weeks gestation of pregnancy: Secondary | ICD-10-CM | POA: Diagnosis not present

## 2018-04-19 DIAGNOSIS — F17210 Nicotine dependence, cigarettes, uncomplicated: Secondary | ICD-10-CM | POA: Diagnosis not present

## 2018-04-19 DIAGNOSIS — O9989 Other specified diseases and conditions complicating pregnancy, childbirth and the puerperium: Secondary | ICD-10-CM | POA: Diagnosis not present

## 2018-04-19 DIAGNOSIS — O162 Unspecified maternal hypertension, second trimester: Secondary | ICD-10-CM | POA: Diagnosis present

## 2018-04-19 DIAGNOSIS — Z98891 History of uterine scar from previous surgery: Secondary | ICD-10-CM

## 2018-04-19 DIAGNOSIS — R55 Syncope and collapse: Secondary | ICD-10-CM | POA: Diagnosis not present

## 2018-04-19 DIAGNOSIS — O10012 Pre-existing essential hypertension complicating pregnancy, second trimester: Secondary | ICD-10-CM | POA: Diagnosis not present

## 2018-04-19 DIAGNOSIS — Z79899 Other long term (current) drug therapy: Secondary | ICD-10-CM | POA: Insufficient documentation

## 2018-04-19 DIAGNOSIS — Z9114 Patient's other noncompliance with medication regimen: Secondary | ICD-10-CM | POA: Insufficient documentation

## 2018-04-19 DIAGNOSIS — O99332 Smoking (tobacco) complicating pregnancy, second trimester: Secondary | ICD-10-CM | POA: Insufficient documentation

## 2018-04-19 DIAGNOSIS — O43193 Other malformation of placenta, third trimester: Secondary | ICD-10-CM | POA: Diagnosis present

## 2018-04-19 DIAGNOSIS — O169 Unspecified maternal hypertension, unspecified trimester: Secondary | ICD-10-CM | POA: Diagnosis present

## 2018-04-19 LAB — CBC WITH DIFFERENTIAL/PLATELET
Abs Immature Granulocytes: 0.04 10*3/uL (ref 0.00–0.07)
BASOS PCT: 0 %
Basophils Absolute: 0 10*3/uL (ref 0.0–0.1)
EOS ABS: 0.5 10*3/uL (ref 0.0–0.5)
EOS PCT: 4 %
HEMATOCRIT: 32.6 % — AB (ref 36.0–46.0)
Hemoglobin: 10.8 g/dL — ABNORMAL LOW (ref 12.0–15.0)
Immature Granulocytes: 0 %
LYMPHS ABS: 2.2 10*3/uL (ref 0.7–4.0)
LYMPHS PCT: 20 %
MCH: 31.1 pg (ref 26.0–34.0)
MCHC: 33.1 g/dL (ref 30.0–36.0)
MCV: 93.9 fL (ref 80.0–100.0)
Monocytes Absolute: 0.6 10*3/uL (ref 0.1–1.0)
Monocytes Relative: 5 %
NEUTROS PCT: 71 %
NRBC: 0 % (ref 0.0–0.2)
Neutro Abs: 7.7 10*3/uL (ref 1.7–7.7)
PLATELETS: 242 10*3/uL (ref 150–400)
RBC: 3.47 MIL/uL — ABNORMAL LOW (ref 3.87–5.11)
RDW: 13.8 % (ref 11.5–15.5)
WBC: 11 10*3/uL — ABNORMAL HIGH (ref 4.0–10.5)

## 2018-04-19 LAB — COMPREHENSIVE METABOLIC PANEL
ALT: 13 U/L (ref 0–44)
ANION GAP: 7 (ref 5–15)
AST: 30 U/L (ref 15–41)
Albumin: 2.8 g/dL — ABNORMAL LOW (ref 3.5–5.0)
Alkaline Phosphatase: 60 U/L (ref 38–126)
BUN: 6 mg/dL (ref 6–20)
CHLORIDE: 107 mmol/L (ref 98–111)
CO2: 21 mmol/L — AB (ref 22–32)
Calcium: 8.6 mg/dL — ABNORMAL LOW (ref 8.9–10.3)
Creatinine, Ser: 0.37 mg/dL — ABNORMAL LOW (ref 0.44–1.00)
GFR calc non Af Amer: >60 mL/min (ref >60)
Glucose, Bld: 65 mg/dL — ABNORMAL LOW (ref 70–99)
Potassium: 3.2 mmol/L — ABNORMAL LOW (ref 3.5–5.1)
SODIUM: 135 mmol/L (ref 135–145)
Total Bilirubin: 0.3 mg/dL (ref 0.3–1.2)
Total Protein: 5.7 g/dL — ABNORMAL LOW (ref 6.5–8.1)

## 2018-04-19 LAB — TYPE AND SCREEN
ABO/RH(D): B POS
Antibody Screen: NEGATIVE

## 2018-04-19 LAB — CBC
HCT: 31.6 % — ABNORMAL LOW (ref 36.0–46.0)
Hemoglobin: 10.5 g/dL — ABNORMAL LOW (ref 12.0–15.0)
MCH: 31.1 pg (ref 26.0–34.0)
MCHC: 33.2 g/dL (ref 30.0–36.0)
MCV: 93.5 fL (ref 80.0–100.0)
NRBC: 0 % (ref 0.0–0.2)
PLATELETS: 244 10*3/uL (ref 150–400)
RBC: 3.38 MIL/uL — ABNORMAL LOW (ref 3.87–5.11)
RDW: 14.1 % (ref 11.5–15.5)
WBC: 12.8 10*3/uL — AB (ref 4.0–10.5)

## 2018-04-19 LAB — CREATININE, SERUM
Creatinine, Ser: 0.56 mg/dL (ref 0.44–1.00)
GFR calc Af Amer: >60 mL/min (ref >60)

## 2018-04-19 LAB — URINALYSIS, ROUTINE W REFLEX MICROSCOPIC
Bilirubin Urine: NEGATIVE
Glucose, UA: NEGATIVE mg/dL
Hgb urine dipstick: NEGATIVE
Ketones, ur: NEGATIVE mg/dL
LEUKOCYTES UA: NEGATIVE
Nitrite: NEGATIVE
Protein, ur: NEGATIVE mg/dL
Specific Gravity, Urine: 1.015 (ref 1.005–1.030)
pH: 6 (ref 5.0–8.0)

## 2018-04-19 LAB — PROTEIN / CREATININE RATIO, URINE
CREATININE, URINE: 90.81 mg/dL
PROTEIN CREATININE RATIO: 0.12 mg/mg{creat} (ref 0.00–0.15)
Total Protein, Urine: 11 mg/dL

## 2018-04-19 LAB — CBG MONITORING, ED: Glucose-Capillary: 61 mg/dL — ABNORMAL LOW (ref 70–99)

## 2018-04-19 MED ORDER — ZOLPIDEM TARTRATE 5 MG PO TABS
5.0000 mg | ORAL_TABLET | Freq: Every evening | ORAL | Status: DC | PRN
  Administered 2018-04-19: 5 mg via ORAL
  Filled 2018-04-19: qty 1

## 2018-04-19 MED ORDER — LABETALOL HCL 5 MG/ML IV SOLN
20.0000 mg | INTRAVENOUS | Status: DC | PRN
  Administered 2018-04-19: 20 mg via INTRAVENOUS
  Filled 2018-04-19: qty 4

## 2018-04-19 MED ORDER — LABETALOL HCL 5 MG/ML IV SOLN
40.0000 mg | INTRAVENOUS | Status: DC | PRN
  Filled 2018-04-19: qty 8

## 2018-04-19 MED ORDER — CALCIUM CARBONATE ANTACID 500 MG PO CHEW: 2.0000 | CHEWABLE_TABLET | ORAL | Status: DC | PRN

## 2018-04-19 MED ORDER — PRENATAL MULTIVITAMIN CH: 1.0000 | ORAL_TABLET | Freq: Every day | ORAL | Status: DC

## 2018-04-19 MED ORDER — ENOXAPARIN SODIUM 40 MG/0.4ML ~~LOC~~ SOLN
40.0000 mg | SUBCUTANEOUS | Status: DC
  Administered 2018-04-19: 40 mg via SUBCUTANEOUS
  Filled 2018-04-19: qty 0.4

## 2018-04-19 MED ORDER — ACETAMINOPHEN 325 MG PO TABS
650.0000 mg | ORAL_TABLET | ORAL | Status: DC | PRN
  Administered 2018-04-19: 650 mg via ORAL
  Filled 2018-04-19: qty 2

## 2018-04-19 MED ORDER — HYDRALAZINE HCL 20 MG/ML IJ SOLN
10.0000 mg | INTRAMUSCULAR | Status: DC | PRN
  Administered 2018-04-19: 10 mg via INTRAVENOUS

## 2018-04-19 MED ORDER — HYDRALAZINE HCL 20 MG/ML IJ SOLN
5.0000 mg | INTRAMUSCULAR | Status: DC | PRN
  Administered 2018-04-19 – 2018-04-20 (×2): 5 mg via INTRAVENOUS
  Filled 2018-04-19 (×2): qty 1

## 2018-04-19 MED ORDER — LABETALOL HCL 100 MG PO TABS
100.0000 mg | ORAL_TABLET | Freq: Two times a day (BID) | ORAL | Status: DC
  Administered 2018-04-19: 100 mg via ORAL
  Filled 2018-04-19: qty 1

## 2018-04-19 MED ORDER — LACTATED RINGERS IV BOLUS
1000.0000 mL | Freq: Once | INTRAVENOUS | Status: AC
  Administered 2018-04-19: 1000 mL via INTRAVENOUS

## 2018-04-19 MED ORDER — LACTATED RINGERS IV BOLUS: 1000.0000 mL | Freq: Once | INTRAVENOUS | Status: DC

## 2018-04-19 MED ORDER — LABETALOL HCL 200 MG PO TABS
200.0000 mg | ORAL_TABLET | Freq: Two times a day (BID) | ORAL | Status: DC
  Administered 2018-04-19 – 2018-04-20 (×2): 200 mg via ORAL
  Filled 2018-04-19: qty 2
  Filled 2018-04-19: qty 1

## 2018-04-19 MED ORDER — DOCUSATE SODIUM 100 MG PO CAPS
100.0000 mg | ORAL_CAPSULE | Freq: Every day | ORAL | Status: DC
  Administered 2018-04-20: 100 mg via ORAL
  Filled 2018-04-19: qty 1

## 2018-04-19 NOTE — ED Notes (Signed)
Pt denies chest pain, SOB, blurry vision, dizziness prior to syncopal episode.

## 2018-04-19 NOTE — Progress Notes (Addendum)
1134 Arrived to evaluate this 32 yo G8P4 in with report of syncopal episode.  Pt was [facility] in a [facility] the community center when she began feeling dizzy and nauseous. She then passed out and slid down to the floor.  She has not had food or drink today. Per EMS CBG in the 70s. She denies vaginal bleeding, LOF or UCs and reports good fetal movement. Fetal movement palpated. At present her BPs are in the severe range. FHR appropriate for gestation. 1144 Dr. Adrian Blackwater notified of pt in ED and of above. Orders placed for HTN in pregnancy placed. Pre eclampsia labs ordered. Dr. Adrian Blackwater request pt BPs be stabilized first then we will discuss transfer and admission to [facility]. 1248 Dr. Adrian Blackwater notified that BPs improved with hydralazine.  Orders for transfer to High [facility] OB at [facility]. 1305 Report to Casimiro Needle, RN. 1400 Continue to await transfer via [facility].

## 2018-04-19 NOTE — ED Notes (Signed)
MD at bedside and Rapid Response OB RN. Will continue to monitor and hold labetalol for now.

## 2018-04-19 NOTE — ED Notes (Signed)
Rapid response OB at bedside to assist

## 2018-04-19 NOTE — H&P (Addendum)
FACULTY PRACTICE ANTEPARTUM ADMISSION HISTORY AND PHYSICAL NOTE   History of Present Illness: Shannon Gonzales is a 32 y.o. [id number] at [redacted]w[redacted]d admitted for uncontrolled chronic HTN. Pt was in a class earlier today and she felt nauseas and went to have an episode of emesis and passed out. Did not hit her head or have a loss of consciousness.  Pt has a h/o chronic HTN. She reports that she had not been compliant with her meds. She usually takes them once a day but, not twice. She reports that she had a HA earlier this evening that resolved with pain meds. She denies visual changes.           Patient reports the fetal movement as active. Patient reports uterine contraction  activity as none. Patient reports  vaginal bleeding as none. Patient describes fluid per vagina as None. Fetal presentation is unsure.  Patient Active Problem List   Diagnosis Date Noted  . Severe hypertension affecting pregnancy in second trimester 04/19/2018  . Hypertension affecting pregnancy, antepartum 04/19/2018  . Marginal insertion of umbilical cord affecting management of mother in second trimester 04/04/2018  . Hypertension in pregnancy, essential, antepartum, second trimester 03/28/2018  . History of preterm delivery, currently pregnant in second trimester 03/28/2018  . Supervision of high risk pregnancy, antepartum, second trimester 03/28/2018  . Late prenatal care affecting pregnancy in second trimester 03/28/2018  . History of cesarean section, low transverse 03/28/2018  . Anxiety 03/28/2018  . Smoker 03/28/2018  . Marijuana use 03/28/2018    Past Medical History:  Diagnosis Date  . Anxiety   . Depression   . Drug abuse, marijuana    with pregnancy  . Hx of PTL (preterm labor), current pregnancy   . Hypertension   . Late prenatal care   . Trichimoniasis    with pregnancy    Past Surgical History:  Procedure Laterality Date  . CESAREAN SECTION      OB History  Gravida Para Term Preterm AB  Living  8 5 1 4 2 4   SAB TAB Ectopic Multiple Live Births  1 1     1     # Outcome Date GA Lbr Len/2nd Weight Sex Delivery Anes PTL Lv  8 Current           7 Preterm 05/07/13 [redacted]w[redacted]d 15:42 / 00:13 2299 g M VBAC   LIV  6 Term 06/29/09 [redacted]w[redacted]d   F Vag-Spont     5 Preterm 03/20/07 [redacted]w[redacted]d   F Vag-Spont  Y   4 Preterm 03/29/06    F CS-LTranv  Y   3 SAB 2007          2 Preterm 2006 [redacted]w[redacted]d   M    FD  1 TAB 2003            Social History   Socioeconomic History  . Marital status: Single    Spouse name: Not on file  . Number of children: Not on file  . Years of education: Not on file  . Highest education level: Not on file  Occupational History  . Not on file  Social Needs  . Financial resource strain: Not on file  . Food insecurity:    Worry: Not on file    Inability: Not on file  . Transportation needs:    Medical: Not on file    Non-medical: Not on file  Tobacco Use  . Smoking status: Current Every Day Smoker    Packs/day: 0.10  Types: Cigarettes  . Smokeless tobacco: Never Used  Substance and Sexual Activity  . Alcohol use: No  . Drug use: Yes    Types: Hashish, Marijuana  . Sexual activity: Yes    Birth control/protection: None  Lifestyle  . Physical activity:    Days per week: Not on file    Minutes per session: Not on file  . Stress: Not on file  Relationships  . Social connections:    Talks on phone: Not on file    Gets together: Not on file    Attends religious service: Not on file    Active member of club or organization: Not on file    Attends meetings of clubs or organizations: Not on file    Relationship status: Not on file  Other Topics Concern  . Not on file  Social History Narrative  . Not on file    Family History  Problem Relation Age of Onset  . Hypertension Mother     No Known Allergies  Medications Prior to Admission  Medication Sig Dispense Refill Last Dose  . labetalol (NORMODYNE) 100 MG tablet Take 100 mg by mouth 2 (two) times daily.   9 04/18/2018 at Unknown time  . Prenatal Multivit-Min-Fe-FA (PRENATAL VITAMINS) 0.8 MG tablet Take 1 tablet by mouth daily.   Past Month at Unknown time  . Elastic Bandages & Supports (COMFORT FIT MATERNITY SUPP LG) MISC 1 Units by Does not apply route daily as needed. 1 each 0   . losartan (COZAAR) 50 MG tablet Take 1 tablet (50 mg total) by mouth daily. (Patient not taking: Reported on 03/28/2018) 30 tablet 0 Not Taking at Unknown time  . metroNIDAZOLE (FLAGYL) 500 MG tablet Take 1 tablet (500 mg total) by mouth 2 (two) times daily. (Patient not taking: Reported on 04/03/2018) 14 tablet 0 Not Taking at Unknown time  . Prenat-FePro-Methf-Ca-Omega 3 (PRENATAL + COMPLETE MULTI) 18-0.8 & 290 MG THPK Take 1 tablet by mouth daily. (Patient not taking: Reported on 04/03/2018) 60 each 6 Not Taking at Unknown time  . terconazole (TERAZOL 3) 0.8 % vaginal cream Place 1 applicator vaginally at bedtime. (Patient not taking: Reported on 04/03/2018) 20 g 0 Not Taking at Unknown time   Review of Systems - neg  Vitals:  BP 123/84 (BP Location: Left Arm)   Pulse 87   Temp 98.1 F (36.7 C) (Oral)   Resp 17   Ht 5\' 1"  (1.549 m)   Wt 69.9 kg   LMP 10/27/2017 (Exact Date)   SpO2 100%   BMI 29.10 kg/m  Physical Examination: GENERAL: Well-developed, well-nourished female in no acute distress.  LUNGS: Clear to auscultation bilaterally.  HEART: Regular rate and rhythm. ABDOMEN: Soft, nontender, nondistended. No organomegaly. EXTREMITIES: No cyanosis, clubbing, or edema Membranes:intact Fetal Monitoring:Baseline: 130's bpm, Variability: Good {> 6 bpm), Accelerations: Non-reactive but appropriate for gestational age and Decelerations: Absent Tocometer: Flat  Labs:  Results for orders placed or performed during the hospital encounter of 04/19/18 (from the past 24 hour(s))  Urinalysis, Routine w reflex microscopic   Collection Time: 04/19/18 11:41 AM  Result Value Ref Range   Color, Urine YELLOW YELLOW    APPearance CLEAR CLEAR   Specific Gravity, Urine 1.015 1.005 - 1.030   pH 6.0 5.0 - 8.0   Glucose, UA NEGATIVE NEGATIVE mg/dL   Hgb urine dipstick NEGATIVE NEGATIVE   Bilirubin Urine NEGATIVE NEGATIVE   Ketones, ur NEGATIVE NEGATIVE mg/dL   Protein, ur NEGATIVE NEGATIVE mg/dL  Nitrite NEGATIVE NEGATIVE   Leukocytes, UA NEGATIVE NEGATIVE  Protein / creatinine ratio, urine   Collection Time: 04/19/18 11:41 AM  Result Value Ref Range   Creatinine, Urine 90.81 mg/dL   Total Protein, Urine 11 mg/dL   Protein Creatinine Ratio 0.12 0.00 - 0.15 mg/mg[Cre]  Comprehensive metabolic panel   Collection Time: 04/19/18 11:51 AM  Result Value Ref Range   Sodium 135 135 - 145 mmol/L   Potassium 3.2 (L) 3.5 - 5.1 mmol/L   Chloride 107 98 - 111 mmol/L   CO2 21 (L) 22 - 32 mmol/L   Glucose, Bld 65 (L) 70 - 99 mg/dL   BUN 6 6 - 20 mg/dL   Creatinine, Ser 4.09 (L) 0.44 - 1.00 mg/dL   Calcium 8.6 (L) 8.9 - 10.3 mg/dL   Total Protein 5.7 (L) 6.5 - 8.1 g/dL   Albumin 2.8 (L) 3.5 - 5.0 g/dL   AST 30 15 - 41 U/L   ALT 13 0 - 44 U/L   Alkaline Phosphatase 60 38 - 126 U/L   Total Bilirubin 0.3 0.3 - 1.2 mg/dL   GFR calc non Af Amer >60 >60 mL/min   GFR calc Af Amer >60 >60 mL/min   Anion gap 7 5 - 15  CBC with Differential   Collection Time: 04/19/18 11:51 AM  Result Value Ref Range   WBC 11.0 (H) 4.0 - 10.5 K/uL   RBC 3.47 (L) 3.87 - 5.11 MIL/uL   Hemoglobin 10.8 (L) 12.0 - 15.0 g/dL   HCT 81.1 (L) 91.4 - 78.2 %   MCV 93.9 80.0 - 100.0 fL   MCH 31.1 26.0 - 34.0 pg   MCHC 33.1 30.0 - 36.0 g/dL   RDW 95.6 21.3 - 08.6 %   Platelets 242 150 - 400 K/uL   nRBC 0.0 0.0 - 0.2 %   Neutrophils Relative % 71 %   Neutro Abs 7.7 1.7 - 7.7 K/uL   Lymphocytes Relative 20 %   Lymphs Abs 2.2 0.7 - 4.0 K/uL   Monocytes Relative 5 %   Monocytes Absolute 0.6 0.1 - 1.0 K/uL   Eosinophils Relative 4 %   Eosinophils Absolute 0.5 0.0 - 0.5 K/uL   Basophils Relative 0 %   Basophils Absolute 0.0 0.0 - 0.1 K/uL    Immature Granulocytes 0 %   Abs Immature Granulocytes 0.04 0.00 - 0.07 K/uL  CBG monitoring, ED   Collection Time: 04/19/18 12:02 PM  Result Value Ref Range   Glucose-Capillary 61 (L) 70 - 99 mg/dL    Imaging Studies: [place] Mfm Ob Detail +14 Wk  Result Date: 04/03/2018 ----------------------------------------------------------------------  OBSTETRICS REPORT                       (Signed Final 04/03/2018 11:23 am) ---------------------------------------------------------------------- Patient Info  ID #:       [id number]                          D.O.B.:  [birth year] (32 yrs)  Name:       [name]                  Visit Date: 04/03/2018 09:48 am ---------------------------------------------------------------------- Performed By  Performed By:     Earley Brooke     Ref. Address:     [facility]  BS, RDMS                                                             OB[facility]                                                             [address]                                                             [place], [place]                                                             [address]  Attending:        Lin Landsman      Location:         [facility]                    MD  Referred By:      [facility] for                    [facility]                    Healthcare ---------------------------------------------------------------------- Orders   #  Description                          Code         Ordered By   1  [place] MFM OB DETAIL +14 WK              [id number]     Vonzella Nipple  ----------------------------------------------------------------------   #  Order #                    Accession #                 Episode #   1  [id number]                  [id number]                  [id number]  ---------------------------------------------------------------------- Indications   Encounter  for antenatal screening  for          Z36.3   malformations   [redacted] weeks gestation of pregnancy                Z3A.22   Late prenatal care, second trimester           O09.32   Poor obstetric history: Previous preterm       O09.219   delivery, antepartum x 4 (28 weeks x 2, 34   weeks)   Marijuana Abuse   Pre-existing essential hypertension            O10.012   complicating pregnancy, second trimester   (Labetalol)   Previous cesarean delivery, antepartum (3      O34.219   subseqent VBACs)   Smoking complicating pregnancy, second         O99.332   trimester  ---------------------------------------------------------------------- Fetal Evaluation  Num Of Fetuses:         1  Fetal Heart Rate(bpm):  149  Cardiac Activity:       Observed  Presentation:           Transverse, head to maternal left  Placenta:               Anterior  P. Cord Insertion:      Marginal insertion  Amniotic Fluid  AFI FV:      Within normal limits                              Largest Pocket(cm)                              6.74 ---------------------------------------------------------------------- Biometry  BPD:      51.3  mm     G. Age:  21w 4d         13  %    CI:        69.58   %    70 - 86                                                          FL/HC:      19.8   %    19.2 - 20.8  HC:      196.3  mm     G. Age:  21w 6d         13  %    HC/AC:      1.13        1.05 - 1.21  AC:      174.2  mm     G. Age:  22w 2d         35  %    FL/BPD:     75.6   %    71 - 87  FL:       38.8  mm     G. Age:  22w 3d         36  %    FL/AC:      22.3   %    20 - 24  HUM:      36.7  mm     G. Age:  22w 6d  50  %  CER:        25  mm     G. Age:  23w 0d         56  %  LV:        6.2  mm  CM:        4.6  mm  Est. FW:     493  gm      1 lb 1 oz     44  % ---------------------------------------------------------------------- OB History  Gravidity:    8         Term:   1        Prem:   4        SAB:   1  TOP:          1       Ectopic:  0        Living: 5  ---------------------------------------------------------------------- Gestational Age  LMP:           22w 4d        Date:  10/27/17                 EDD:   08/03/18  U/S Today:     22w 0d                                        EDD:   08/07/18  Best:          22w 4d     Det. By:  LMP  (10/27/17)          EDD:   08/03/18 ---------------------------------------------------------------------- Anatomy  Cranium:               Appears normal         LVOT:                   Appears normal  Cavum:                 Appears normal         Aortic Arch:            Appears normal  Ventricles:            Appears normal         Ductal Arch:            Not well visualized  Choroid Plexus:        Appears normal         Diaphragm:              Appears normal  Cerebellum:            Appears normal         Stomach:                Appears normal, left                                                                        sided  Posterior Fossa:       Appears normal         Abdomen:  Appears normal  Nuchal Fold:           Appears normal         Abdominal Wall:         Appears nml (cord                                                                        insert, abd wall)  Face:                  Appears normal         Cord Vessels:           Appears normal (3                         (orbits and profile)                           vessel cord)  Lips:                  Appears normal         Kidneys:                Appear normal  Palate:                Not well visualized    Bladder:                Appears normal  Thoracic:              Appears normal         Upper Extremities:      Appears normal  Heart:                 Appears normal         Lower Extremities:      Appears normal                         (4CH, axis, and situs  RVOT:                  Not well visualized  Other:  Fetus appears to be female. Heels visualized. Technically difficult due          to fetal position.  ---------------------------------------------------------------------- Cervix Uterus Adnexa  Cervix  Length:            4.3  cm.  Normal appearance by transabdominal scan.  Left Ovary  Within normal limits.  Right Ovary  Not visualized.  Adnexa  No abnormality visualized. ---------------------------------------------------------------------- Myomas   Site                     L(cm)      W(cm)      D(cm)      Location   Posterior                2.6        2.2        1.9  ----------------------------------------------------------------------   Blood Flow                 RI  PI       Comments  ---------------------------------------------------------------------- Impression  Normal interval growth.  No ultrasonic evidence of structural  fetal anomalies. ---------------------------------------------------------------------- Recommendations  Suboptimal cardiac views seen today. Scheduled follow up in  4 weeks.  CHTN- did not take medication this morning. Scheduled for  BP check on Friday. Asymptomatic. ----------------------------------------------------------------------               Lin Landsman, MD Electronically Signed Final Report   04/03/2018 11:23 am ----------------------------------------------------------------------    Prenatal Transfer Tool  Maternal Diabetes: No Genetic Screening: Declined Maternal Ultrasounds/Referrals:marginal cord insertion Fetal Ultrasounds or other Referrals:  None Maternal Substance Abuse:  No Significant Maternal Medications: Labetalol  Significant Maternal Lab Results: None   Assessment and Plan: Patient Active Problem List   Diagnosis Date Noted  . Severe hypertension affecting pregnancy in second trimester 04/19/2018  . Hypertension affecting pregnancy, antepartum 04/19/2018  . Marginal insertion of umbilical cord affecting management of mother in second trimester 04/04/2018  . Hypertension in pregnancy, essential, antepartum, second trimester  03/28/2018  . History of preterm delivery, currently pregnant in second trimester 03/28/2018  . Supervision of high risk pregnancy, antepartum, second trimester 03/28/2018  . Late prenatal care affecting pregnancy in second trimester 03/28/2018  . History of cesarean section, low transverse 03/28/2018  . Anxiety 03/28/2018  . Smoker 03/28/2018  . Marijuana use 03/28/2018   Admit to Antenatal for observation Labetalol 200mg  bid Routine antenatal care If BP remains stable on current meds, will discharge in the am  Rigley Niess L. Harraway-Smith, M.D., Evern Core

## 2018-04-19 NOTE — ED Notes (Signed)
Patient is resting comfortably. 

## 2018-04-19 NOTE — ED Provider Notes (Signed)
[facility] EMERGENCY DEPARTMENT Provider Note   CSN: [id number] Arrival date & time: 04/19/18  1128     History   Chief Complaint Chief Complaint  Patient presents with  . Loss of Consciousness    HPI Shannon Gonzales is a 32 y.o. female.  HPI Patient presents after syncopal episode.  Came from class and felt lightheaded and passed out.  No injury.  She is around [redacted] weeks pregnant.  G7, P4.  Seen at [facility] hospital for high risk pregnancy.  Has had high blood pressure both with and prior to the pregnancies.  Still feels baby moving.  No chest pain or trouble breathing.  No right upper quadrant abdominal pain.  Patient did not take her labetalol today.  Past Medical History:  Diagnosis Date  . Anxiety   . Depression   . Drug abuse, marijuana    with pregnancy  . Hx of PTL (preterm labor), current pregnancy   . Hypertension   . Late prenatal care   . Trichimoniasis    with pregnancy    Patient Active Problem List   Diagnosis Date Noted  . Severe hypertension affecting pregnancy in second trimester 04/19/2018  . Marginal insertion of umbilical cord affecting management of mother in second trimester 04/04/2018  . Hypertension in pregnancy, essential, antepartum, second trimester 03/28/2018  . History of preterm delivery, currently pregnant in second trimester 03/28/2018  . Supervision of high risk pregnancy, antepartum, second trimester 03/28/2018  . Late prenatal care affecting pregnancy in second trimester 03/28/2018  . History of cesarean section, low transverse 03/28/2018  . Anxiety 03/28/2018  . Smoker 03/28/2018  . Marijuana use 03/28/2018    Past Surgical History:  Procedure Laterality Date  . CESAREAN SECTION       OB History    Gravida  8   Para  5   Term  1   Preterm  4   AB  2   Living  4     SAB  1   TAB  1   Ectopic      Multiple      Live Births  1            Home Medications    Prior to Admission  medications   Medication Sig Start Date End Date Taking? Authorizing Provider  labetalol (NORMODYNE) 100 MG tablet Take 100 mg by mouth 2 (two) times daily. 02/07/18  Yes [provider]  Prenatal Multivit-Min-Fe-FA (PRENATAL VITAMINS) 0.8 MG tablet Take 1 tablet by mouth daily.   Yes [provider]  Elastic Bandages & Supports (COMFORT FIT MATERNITY SUPP LG) MISC 1 Units by Does not apply route daily as needed. 03/28/18   Marny Lowenstein, PA-C  losartan (COZAAR) 50 MG tablet Take 1 tablet (50 mg total) by mouth daily. Patient not taking: Reported on 03/28/2018 10/24/16   Hayden Rasmussen, NP  metroNIDAZOLE (FLAGYL) 500 MG tablet Take 1 tablet (500 mg total) by mouth 2 (two) times daily. Patient not taking: Reported on 04/03/2018 03/29/18   Marny Lowenstein, PA-C  Prenat-FePro-Methf-Ca-Omega 3 (PRENATAL + COMPLETE MULTI) 18-0.8 & 290 MG THPK Take 1 tablet by mouth daily. Patient not taking: Reported on 04/03/2018 03/28/18   Marny Lowenstein, PA-C  terconazole (TERAZOL 3) 0.8 % vaginal cream Place 1 applicator vaginally at bedtime. Patient not taking: Reported on 04/03/2018 03/29/18   Marny Lowenstein, PA-C    Family History Family History  Problem Relation Age of Onset  .  Hypertension Mother     Social History Social History   Tobacco Use  . Smoking status: Current Every Day Smoker    Packs/day: 0.10    Types: Cigarettes  . Smokeless tobacco: Never Used  Substance Use Topics  . Alcohol use: No  . Drug use: Yes    Types: Hashish, Marijuana     Allergies   Patient has no known allergies.   Review of Systems Review of Systems  Constitutional: Negative for appetite change.  HENT: Negative for congestion.   Respiratory: Negative for shortness of breath.   Cardiovascular: Negative for chest pain.  Gastrointestinal: Negative for abdominal pain.  Genitourinary: Negative for flank pain.  Musculoskeletal: Negative for back pain.  Skin: Negative for rash.  Neurological:  Positive for light-headedness.  Hematological: Negative for adenopathy.  Psychiatric/Behavioral: Negative for confusion.     Physical Exam Updated Vital Signs BP (!) 151/105 (BP Location: Left Arm)   Pulse 90   Temp (!) 97 F (36.1 C) (Oral)   Resp 20   LMP 10/27/2017 (Exact Date)   SpO2 100%   Physical Exam  Constitutional: She appears well-developed.  HENT:  Head: Normocephalic.  Eyes: EOM are normal.  Neck: Neck supple.  Cardiovascular: Normal rate.  Pulmonary/Chest: She has no wheezes.  Abdominal: She exhibits mass.  Pregnant to above umbilicus.  Musculoskeletal: She exhibits no edema.  Neurological: She is alert.  Skin: Skin is warm. Capillary refill takes less than 2 seconds.     ED Treatments / Results  Labs (all labs ordered are listed, but only abnormal results are displayed) Labs Reviewed  CBC WITH DIFFERENTIAL/PLATELET - Abnormal; Notable for the following components:      Result Value   WBC 11.0 (*)    RBC 3.47 (*)    Hemoglobin 10.8 (*)    HCT 32.6 (*)    All other components within normal limits  CBG MONITORING, ED - Abnormal; Notable for the following components:   Glucose-Capillary 61 (*)    All other components within normal limits  URINALYSIS, ROUTINE W REFLEX MICROSCOPIC  PROTEIN / CREATININE RATIO, URINE  COMPREHENSIVE METABOLIC PANEL    EKG EKG Interpretation  Date/Time:  Friday April 19 2018 11:38:02 EDT Ventricular Rate:  83 PR Interval:    QRS Duration: 83 QT Interval:  362 QTC Calculation: 426 R Axis:   54 Text Interpretation:  Sinus rhythm Confirmed by Benjiman Core [phone]) on 04/19/2018 12:19:33 PM   Radiology No results found.  Procedures Procedures (including critical care time)  Medications Ordered in ED Medications  hydrALAZINE (APRESOLINE) injection 5 mg (5 mg Intravenous Given 04/19/18 1201)    And  hydrALAZINE (APRESOLINE) injection 10 mg (10 mg Intravenous Given 04/19/18 1216)    And  labetalol  (NORMODYNE,TRANDATE) injection 20 mg (has no administration in time range)    And  labetalol (NORMODYNE,TRANDATE) injection 40 mg (has no administration in time range)  lactated ringers bolus 1,000 mL (0 mLs Intravenous Stopped 04/19/18 1306)     Initial Impression / Assessment and Plan / ED Course  I have reviewed the triage vital signs and the nursing notes.  Pertinent labs & imaging results that were available during my care of the patient were reviewed by me and considered in my medical decision making (see chart for details).     Patient presents after a syncopal episode.  She is around [redacted] weeks pregnant.  However found to be severely hypertensive here.  Pre-existing hypertension but has not been on the  medicine today.  Discussed with Dr. Adrian Blackwater.  Started on IV treatment.  [facility] No injury from the syncope.  Doubt pulmonary embolism.  CRITICAL CARE Performed by: Benjiman Core Total critical care time: 30 minutes Critical care time was exclusive of separately billable procedures and treating other patients. Critical care was necessary to treat or prevent imminent or life-threatening deterioration. Critical care was time spent personally by me on the following activities: development of treatment plan with patient and/or surrogate as well as nursing, discussions with consultants, evaluation of patient's response to treatment, examination of patient, obtaining history from patient or surrogate, ordering and performing treatments and interventions, ordering and review of laboratory studies, ordering and review of radiographic studies, pulse oximetry and re-evaluation of patient's condition.   Final Clinical Impressions(s) / ED Diagnoses   Final diagnoses:  Hypertension affecting pregnancy in second trimester    ED Discharge Orders    None       Benjiman Core, MD 04/19/18 1324

## 2018-04-19 NOTE — Plan of Care (Signed)
Pt admitted to the unit. Assessed for fall risk, introduced to unit and plan of care.

## 2018-04-19 NOTE — ED Notes (Signed)
carelink called  

## 2018-04-19 NOTE — ED Notes (Signed)
Juice given

## 2018-04-19 NOTE — ED Notes (Signed)
[facility] (Transfer to [facility] 3rd floor) called @ 1321-per Lanora Manis, RN-called by Marylene Land

## 2018-04-19 NOTE — ED Triage Notes (Signed)
Felt bad yesterday and did not eat today. CBG 78. She went to MD office and fell forward from chair onto tile floor. Was LOC for 1 minute. G7 P4. Hx of cervical dilation issues. Reports still feels unwell - dizzy.

## 2018-04-20 MED ORDER — ASPIRIN 81 MG PO CHEW: 81.0000 mg | CHEWABLE_TABLET | Freq: Every day | ORAL | 3 refills | Status: DC

## 2018-04-20 MED ORDER — LABETALOL HCL 200 MG PO TABS: 300.0000 mg | ORAL_TABLET | Freq: Two times a day (BID) | ORAL | 3 refills | Status: DC

## 2018-04-20 MED ORDER — LABETALOL HCL 200 MG PO TABS: 100.0000 mg | ORAL_TABLET | Freq: Two times a day (BID) | ORAL | 3 refills | Status: DC

## 2018-04-20 MED ORDER — ZOLPIDEM TARTRATE 5 MG PO TABS: 5.0000 mg | ORAL_TABLET | Freq: Every evening | ORAL | 0 refills | Status: DC | PRN

## 2018-04-20 NOTE — Progress Notes (Signed)
Discharge instructions given to patient and she verbalized understanding of all instructions given. Written copy of AVS given to patient. 

## 2018-04-20 NOTE — Discharge Summary (Signed)
Antenatal Physician Discharge Summary  Patient ID: Shannon Gonzales MRN: [id number] DOB/AGE: [birth year] 32 y.o.  Admit date: 04/19/2018 Discharge date: 04/20/2018  Admission Diagnoses: uncontrolled chronic HTN; noncompliance with meds  Discharge Diagnoses: same  Prenatal Procedures: NST  Significant Diagnostic Studies:  Results for orders placed or performed during the hospital encounter of 04/19/18 (from the past 168 hour(s))  Urinalysis, Routine w reflex microscopic   Collection Time: 04/19/18 11:41 AM  Result Value Ref Range   Color, Urine YELLOW YELLOW   APPearance CLEAR CLEAR   Specific Gravity, Urine 1.015 1.005 - 1.030   pH 6.0 5.0 - 8.0   Glucose, UA NEGATIVE NEGATIVE mg/dL   Hgb urine dipstick NEGATIVE NEGATIVE   Bilirubin Urine NEGATIVE NEGATIVE   Ketones, ur NEGATIVE NEGATIVE mg/dL   Protein, ur NEGATIVE NEGATIVE mg/dL   Nitrite NEGATIVE NEGATIVE   Leukocytes, UA NEGATIVE NEGATIVE  Protein / creatinine ratio, urine   Collection Time: 04/19/18 11:41 AM  Result Value Ref Range   Creatinine, Urine 90.81 mg/dL   Total Protein, Urine 11 mg/dL   Protein Creatinine Ratio 0.12 0.00 - 0.15 mg/mg[Cre]  Comprehensive metabolic panel   Collection Time: 04/19/18 11:51 AM  Result Value Ref Range   Sodium 135 135 - 145 mmol/L   Potassium 3.2 (L) 3.5 - 5.1 mmol/L   Chloride 107 98 - 111 mmol/L   CO2 21 (L) 22 - 32 mmol/L   Glucose, Bld 65 (L) 70 - 99 mg/dL   BUN 6 6 - 20 mg/dL   Creatinine, Ser 0.86 (L) 0.44 - 1.00 mg/dL   Calcium 8.6 (L) 8.9 - 10.3 mg/dL   Total Protein 5.7 (L) 6.5 - 8.1 g/dL   Albumin 2.8 (L) 3.5 - 5.0 g/dL   AST 30 15 - 41 U/L   ALT 13 0 - 44 U/L   Alkaline Phosphatase 60 38 - 126 U/L   Total Bilirubin 0.3 0.3 - 1.2 mg/dL   GFR calc non Af Amer >60 >60 mL/min   GFR calc Af Amer >60 >60 mL/min   Anion gap 7 5 - 15  CBC with Differential   Collection Time: 04/19/18 11:51 AM  Result Value Ref Range   WBC 11.0 (H) 4.0 - 10.5 K/uL   RBC 3.47 (L) 3.87  - 5.11 MIL/uL   Hemoglobin 10.8 (L) 12.0 - 15.0 g/dL   HCT 57.8 (L) 46.9 - 62.9 %   MCV 93.9 80.0 - 100.0 fL   MCH 31.1 26.0 - 34.0 pg   MCHC 33.1 30.0 - 36.0 g/dL   RDW 52.8 41.3 - 24.4 %   Platelets 242 150 - 400 K/uL   nRBC 0.0 0.0 - 0.2 %   Neutrophils Relative % 71 %   Neutro Abs 7.7 1.7 - 7.7 K/uL   Lymphocytes Relative 20 %   Lymphs Abs 2.2 0.7 - 4.0 K/uL   Monocytes Relative 5 %   Monocytes Absolute 0.6 0.1 - 1.0 K/uL   Eosinophils Relative 4 %   Eosinophils Absolute 0.5 0.0 - 0.5 K/uL   Basophils Relative 0 %   Basophils Absolute 0.0 0.0 - 0.1 K/uL   Immature Granulocytes 0 %   Abs Immature Granulocytes 0.04 0.00 - 0.07 K/uL  CBG monitoring, ED   Collection Time: 04/19/18 12:02 PM  Result Value Ref Range   Glucose-Capillary 61 (L) 70 - 99 mg/dL  CBC   Collection Time: 04/19/18  9:14 PM  Result Value Ref Range   WBC 12.8 (H) 4.0 -  10.5 K/uL   RBC 3.38 (L) 3.87 - 5.11 MIL/uL   Hemoglobin 10.5 (L) 12.0 - 15.0 g/dL   HCT 16.1 (L) 09.6 - 04.5 %   MCV 93.5 80.0 - 100.0 fL   MCH 31.1 26.0 - 34.0 pg   MCHC 33.2 30.0 - 36.0 g/dL   RDW 40.9 81.1 - 91.4 %   Platelets 244 150 - 400 K/uL   nRBC 0.0 0.0 - 0.2 %  Creatinine, serum   Collection Time: 04/19/18  9:14 PM  Result Value Ref Range   Creatinine, Ser 0.56 0.44 - 1.00 mg/dL   GFR calc non Af Amer >60 >60 mL/min   GFR calc Af Amer >60 >60 mL/min  Type and screen [facility]   Collection Time: 04/19/18  9:14 PM  Result Value Ref Range   ABO/RH(D) B POS    Antibody Screen NEG    Sample Expiration      04/22/2018 Performed at [facility], [address] [place], [place] [address]     Treatments: antihypertensives  Hospital Course:  This is a 32 y.o. [id number] with IUP at [redacted]w[redacted]d admitted for uncontrolled chronic HTN and possible vagal episode.  She was admitted with severe range BPs and received IV Hydralazine. No HA oe visual changes or RUQ pain. No leaking of fluid and no bleeding.  After  the initial does of IV hydralazine, pt received Labetalol 200mg  bid. Her BPs were slightly elevated but, remained stable.  She was observed, fetal heart rate monitoring remained reassuring, and she had no signs/symptoms of severe range BPs or other maternal-fetal concerns.   She was deemed stable for discharge to home with outpatient follow up.  Discharge Exam: BP 129/90 (BP Location: Left Arm)   Pulse 78   Temp 97.7 F (36.5 C) (Oral)   Resp 17   Ht 5\' 1"  (1.549 m)   Wt 69.9 kg   LMP 10/27/2017 (Exact Date)   SpO2 100%   BMI 29.10 kg/m  General appearance: alert, cooperative and no distress Resp: clear to auscultation bilaterally Cardio: regular rate and rhythm, S1, S2 normal, no murmur, click, rub or gallop GI: soft, non-tender; bowel sounds normal; no masses,  no organomegaly and gravid Extremities: extremities normal, atraumatic, no cyanosis or edema  CMP Latest Ref Rng & Units 04/19/2018 04/19/2018 03/28/2018  Glucose 70 - 99 mg/dL - 57(Q) 46(N)  BUN 6 - 20 mg/dL - 6 7  Creatinine 6.29 - 1.00 mg/dL 5.28 4.13(K) 4.40(N)  Sodium 135 - 145 mmol/L - 135 134  Potassium 3.5 - 5.1 mmol/L - 3.2(L) 4.0  Chloride 98 - 111 mmol/L - 107 101  CO2 22 - 32 mmol/L - 21(L) 20  Calcium 8.9 - 10.3 mg/dL - 8.6(L) 9.2  Total Protein 6.5 - 8.1 g/dL - 5.7(L) 6.1  Total Bilirubin 0.3 - 1.2 mg/dL - 0.3 <0.2  Alkaline Phos 38 - 126 U/L - 60 63  AST 15 - 41 U/L - 30 27  ALT 0 - 44 U/L - 13 11   CBC Latest Ref Rng & Units 04/19/2018 04/19/2018 02/07/2018  WBC 4.0 - 10.5 K/uL 12.8(H) 11.0(H) -  Hemoglobin 12.0 - 15.0 g/dL 10.5(L) 10.8(L) 11.0  Hematocrit 36.0 - 46.0 % 31.6(L) 32.6(L) 34  Platelets 150 - 400 K/uL 244 242 265   Discharge Condition: good  Disposition: Discharge disposition: 01-Home or Self Care       Discharge Instructions    Discharge activity:  No Restrictions   Complete by:  As directed    Discharge diet:   Complete by:  As directed    Heart healthy diet   No sexual  activity restrictions   Complete by:  As directed    Notify physician for a general feeling that "something is not right"   Complete by:  As directed    Notify physician for increase or change in vaginal discharge   Complete by:  As directed    Notify physician for intestinal cramps, with or without diarrhea, sometimes described as "gas pain"   Complete by:  As directed    Notify physician for leaking of fluid   Complete by:  As directed    Notify physician for low, dull backache, unrelieved by heat or Tylenol   Complete by:  As directed    Notify physician for menstrual like cramps   Complete by:  As directed    Notify physician for pelvic pressure   Complete by:  As directed    Notify physician for uterine contractions.  These may be painless and feel like the uterus is tightening or the baby is  "balling up"   Complete by:  As directed    Notify physician for vaginal bleeding   Complete by:  As directed    PRETERM LABOR:  Includes any of the follwing symptoms that occur between 20 - [redacted] weeks gestation.  If these symptoms are not stopped, preterm labor can result in preterm delivery, placing your baby at risk   Complete by:  As directed      Allergies as of 04/20/2018   No Known Allergies     Medication List    STOP taking these medications   losartan 50 MG tablet Commonly known as:  COZAAR   metroNIDAZOLE 500 MG tablet Commonly known as:  FLAGYL   Prenatal Vitamins 0.8 MG tablet   terconazole 0.8 % vaginal cream Commonly known as:  TERAZOL 3     TAKE these medications   aspirin 81 MG chewable tablet Chew 1 tablet (81 mg total) by mouth daily.   COMFORT FIT MATERNITY SUPP LG Misc 1 Units by Does not apply route daily as needed.   labetalol 200 MG tablet Commonly known as:  NORMODYNE Take 1.5 tablets (300 mg total) by mouth 2 (two) times daily. What changed:    medication strength  how much to take   PRENATAL + COMPLETE MULTI 18-0.8 & 290 MG Thpk Take 1  tablet by mouth daily.   zolpidem 5 MG tablet Commonly known as:  AMBIEN Take 1 tablet (5 mg total) by mouth at bedtime as needed for sleep.      Follow-up Information    [facility] Follow up.   Why:  Pt has appointment on 10/17. Contact information: [address] [place] [address] [phone]          Signed: Willodean Rosenthal M.D. 04/20/2018, 7:50 AM

## 2018-04-20 NOTE — Discharge Instructions (Signed)
Hypertension During Pregnancy °Hypertension, commonly called high blood pressure, is when the force of blood pumping through your arteries is too strong. Arteries are blood vessels that carry blood from the heart throughout the body. Hypertension during pregnancy can cause problems for you and your baby. Your baby may be born early (prematurely) or may not weigh as much as he or she should at birth. Very bad cases of hypertension during pregnancy can be life-threatening. °Different types of hypertension can occur during pregnancy. These include: °· Chronic hypertension. This happens when: °? You have hypertension before pregnancy and it continues during pregnancy. °? You develop hypertension before you are [redacted] weeks pregnant, and it continues during pregnancy. °· Gestational hypertension. This is hypertension that develops after the 20th week of pregnancy. °· Preeclampsia, also called toxemia of pregnancy. This is a very serious type of hypertension that develops only during pregnancy. It affects the whole body, and it can be very dangerous for you and your baby. ° °Gestational hypertension and preeclampsia usually go away within 6 weeks after your baby is born. Women who have hypertension during pregnancy have a greater chance of developing hypertension later in life or during future pregnancies. °What are the causes? °The exact cause of hypertension is not known. °What increases the risk? °There are certain factors that make it more likely for you to develop hypertension during pregnancy. These include: °· Having hypertension during a previous pregnancy or prior to pregnancy. °· Being overweight. °· Being older than age 40. °· Being pregnant for the first time or being pregnant with more than one baby. °· Becoming pregnant using fertilization methods such as IVF (in vitro fertilization). °· Having diabetes, kidney problems, or systemic lupus erythematosus. °· Having a family history of hypertension. ° °What are the  signs or symptoms? °Chronic hypertension and gestational hypertension rarely cause symptoms. Preeclampsia causes symptoms, which may include: °· Increased protein in your urine. Your health care provider will check for this at every visit before you give birth (prenatal visit). °· Severe headaches. °· Sudden weight gain. °· Swelling of the hands, face, legs, and feet. °· Nausea and vomiting. °· Vision problems, such as blurred or double vision. °· Numbness in the face, arms, legs, and feet. °· Dizziness. °· Slurred speech. °· Sensitivity to bright lights. °· Abdominal pain. °· Convulsions. ° °How is this diagnosed? °You may be diagnosed with hypertension during a routine prenatal exam. At each prenatal visit, you may: °· Have a urine test to check for high amounts of protein in your urine. °· Have your blood pressure checked. A blood pressure reading is recorded as two numbers, such as "120 over 80" (or 120/80). The first ("top") number is called the systolic pressure. It is a measure of the pressure in your arteries when your heart beats. The second ("bottom") number is called the diastolic pressure. It is a measure of the pressure in your arteries as your heart relaxes between beats. Blood pressure is measured in a unit called mm Hg. A normal blood pressure reading is: °? Systolic: below 120. °? Diastolic: below 80. ° °The type of hypertension that you are diagnosed with depends on your test results and when your symptoms developed. °· Chronic hypertension is usually diagnosed before 20 weeks of pregnancy. °· Gestational hypertension is usually diagnosed after 20 weeks of pregnancy. °· Hypertension with high amounts of protein in the urine is diagnosed as preeclampsia. °· Blood pressure measurements that stay above 160 systolic, or above 110 diastolic, are   signs of severe preeclampsia. ° °How is this treated? °Treatment for hypertension during pregnancy varies depending on the type of hypertension you have and how  serious it is. °· If you take medicines called ACE inhibitors to treat chronic hypertension, you may need to switch medicines. ACE inhibitors should not be taken during pregnancy. °· If you have gestational hypertension, you may need to take blood pressure medicine. °· If you are at risk for preeclampsia, your health care provider may recommend that you take a low-dose aspirin every day to prevent high blood pressure during your pregnancy. °· If you have severe preeclampsia, you may need to be hospitalized so you and your baby can be monitored closely. You may also need to take medicine (magnesium sulfate) to prevent seizures and to lower blood pressure. This medicine may be given as an injection or through an IV tube. °· In some cases, if your condition gets worse, you may need to deliver your baby early. ° °Follow these instructions at home: °Eating and drinking °· Drink enough fluid to keep your urine clear or pale yellow. °· Eat a healthy diet that is low in salt (sodium). Do not add salt to your food. Check food labels to see how much sodium a food or beverage contains. °Lifestyle °· Do not use any products that contain nicotine or tobacco, such as cigarettes and e-cigarettes. If you need help quitting, ask your health care provider. °· Do not use alcohol. °· Avoid caffeine. °· Avoid stress as much as possible. Rest and get plenty of sleep. °General instructions °· Take over-the-counter and prescription medicines only as told by your health care provider. °· While lying down, lie on your left side. This keeps pressure off your baby. °· While sitting or lying down, raise (elevate) your feet. Try putting some pillows under your lower legs. °· Exercise regularly. Ask your health care provider what kinds of exercise are best for you. °· Keep all prenatal and follow-up visits as told by your health care provider. This is important. °Contact a health care provider if: °· You have symptoms that your health care  provider told you may require more treatment or monitoring, such as: °? Fever. °? Vomiting. °? Headache. °Get help right away if: °· You have severe abdominal pain or vomiting that does not get better with treatment. °· You suddenly develop swelling in your hands, ankles, or face. °· You gain 4 lbs (1.8 kg) or more in 1 week. °· You develop vaginal bleeding, or you have blood in your urine. °· You do not feel your baby moving as much as usual. °· You have blurred or double vision. °· You have muscle twitching or sudden tightening (spasms). °· You have shortness of breath. °· Your lips or fingernails turn blue. °This information is not intended to replace advice given to you by your health care provider. Make sure you discuss any questions you have with your health care provider. °Document Released: 03/14/2011 Document Revised: 01/14/2016 Document Reviewed: 12/10/2015 °Elsevier Interactive Patient Education © 2018 [employer] ° °

## 2018-04-25 ENCOUNTER — Encounter: Payer: Self-pay | Admitting: Family Medicine

## 2018-04-25 ENCOUNTER — Encounter: Payer: Medicaid Other | Admitting: Family Medicine

## 2018-04-25 NOTE — BH Specialist Note (Deleted)
Integrated Behavioral Health Initial Visit  MRN: [id number] Name: Shannon Gonzales  Number of Integrated Behavioral Health Clinician visits:: 1/6 Session Start time: ***  Session End time: *** Total time: {IBH Total Time:21014050}  Type of Service: Integrated Behavioral Health- Individual/Family Interpretor:No. Interpretor Name and Language: n/a   Warm Hand Off Completed.       SUBJECTIVE: Shannon Gonzales is a 32 y.o. female accompanied by {CHL AMB ACCOMPANIED WU:9811914782} Patient was referred by Tinnie Gens, MD for *** . Patient reports the following symptoms/concerns: *** Duration of problem: ***; Severity of problem: {Mild/Moderate/Severe:20260}  OBJECTIVE: Mood: {BHH MOOD:22306} and Affect: {BHH AFFECT:22307} Risk of harm to self or others: No plan to harm self or others  LIFE CONTEXT: Family and Social: *** School/Work: *** Self-Care: *** Life Changes: Current pregnancy ***  GOALS ADDRESSED: Patient will: 1. Reduce symptoms of: {IBH Symptoms:21014056} 2. Increase knowledge and/or ability of: {IBH Patient Tools:21014057}  3. Demonstrate ability to: {IBH Goals:21014053}  INTERVENTIONS: Interventions utilized: {IBH Interventions:21014054}  Standardized Assessments completed: GAD-7 and PHQ 9  ASSESSMENT: Patient currently experiencing ***   Patient may benefit from psychoeducation and brief therapeutic interventions regarding coping with symptoms of ***  .  PLAN: 1. Follow up with behavioral health clinician on : *** 2. Behavioral recommendations: *** 3. Referral(s): {IBH Referrals:21014055} 4. "From scale of 1-10, how likely are you to follow plan?": ***  Rae Lips, LCSW   Depression screen [facility] 2/9 03/28/2018  Decreased Interest 1  Down, Depressed, Hopeless 0  PHQ - 2 Score 1  Altered sleeping 3  Tired, decreased energy 3  Change in appetite 2  Feeling bad or failure about yourself  0  Trouble concentrating 0  Moving slowly or  fidgety/restless 1  Suicidal thoughts 0  PHQ-9 Score 10   GAD 7 : Generalized Anxiety Score 03/28/2018  Nervous, Anxious, on Edge 0  Control/stop worrying 1  Worry too much - different things 2  Trouble relaxing 2  Restless 2  Easily annoyed or irritable 2  Afraid - awful might happen 0  Total GAD 7 Score 9    ***

## 2018-04-25 NOTE — Progress Notes (Signed)
Patient did not keep appointment today. She will be called to reschedule.  

## 2018-05-02 ENCOUNTER — Ambulatory Visit (HOSPITAL_COMMUNITY)
Admission: RE | Admit: 2018-05-02 | Discharge: 2018-05-02 | Disposition: A | Discharge status: Home / Self Care | Payer: Medicaid Other | Source: Ambulatory Visit | Attending: Medical | Admitting: Medical

## 2018-05-02 ENCOUNTER — Encounter (HOSPITAL_COMMUNITY): Payer: Self-pay

## 2018-05-20 ENCOUNTER — Encounter (HOSPITAL_COMMUNITY): Payer: Self-pay

## 2018-05-22 ENCOUNTER — Encounter: Payer: Self-pay | Admitting: Obstetrics & Gynecology

## 2018-05-22 ENCOUNTER — Other Ambulatory Visit: Payer: Self-pay | Admitting: Obstetrics & Gynecology

## 2018-05-22 ENCOUNTER — Encounter (HOSPITAL_COMMUNITY): Payer: Self-pay

## 2018-05-22 ENCOUNTER — Ambulatory Visit (HOSPITAL_COMMUNITY)
Admission: RE | Admit: 2018-05-22 | Discharge: 2018-05-22 | Disposition: A | Discharge status: Home / Self Care | Payer: Medicaid Other | Source: Ambulatory Visit | Attending: Maternal & Fetal Medicine | Admitting: Maternal & Fetal Medicine

## 2018-05-22 ENCOUNTER — Ambulatory Visit (INDEPENDENT_AMBULATORY_CARE_PROVIDER_SITE_OTHER): Payer: Medicaid Other | Admitting: Obstetrics & Gynecology

## 2018-05-22 VITALS — BP 151/97 | HR 77 | Wt 152.7 lb

## 2018-05-22 DIAGNOSIS — Z3A29 29 weeks gestation of pregnancy: Secondary | ICD-10-CM | POA: Diagnosis not present

## 2018-05-22 DIAGNOSIS — O0933 Supervision of pregnancy with insufficient antenatal care, third trimester: Secondary | ICD-10-CM | POA: Insufficient documentation

## 2018-05-22 DIAGNOSIS — O34219 Maternal care for unspecified type scar from previous cesarean delivery: Secondary | ICD-10-CM | POA: Diagnosis not present

## 2018-05-22 DIAGNOSIS — O0992 Supervision of high risk pregnancy, unspecified, second trimester: Secondary | ICD-10-CM

## 2018-05-22 DIAGNOSIS — Z362 Encounter for other antenatal screening follow-up: Secondary | ICD-10-CM | POA: Insufficient documentation

## 2018-05-22 DIAGNOSIS — O0993 Supervision of high risk pregnancy, unspecified, third trimester: Secondary | ICD-10-CM

## 2018-05-22 DIAGNOSIS — O09213 Supervision of pregnancy with history of pre-term labor, third trimester: Secondary | ICD-10-CM | POA: Diagnosis not present

## 2018-05-22 DIAGNOSIS — O10013 Pre-existing essential hypertension complicating pregnancy, third trimester: Secondary | ICD-10-CM

## 2018-05-22 DIAGNOSIS — O43193 Other malformation of placenta, third trimester: Secondary | ICD-10-CM

## 2018-05-22 DIAGNOSIS — Z98891 History of uterine scar from previous surgery: Secondary | ICD-10-CM

## 2018-05-22 DIAGNOSIS — O99333 Smoking (tobacco) complicating pregnancy, third trimester: Secondary | ICD-10-CM | POA: Diagnosis not present

## 2018-05-22 DIAGNOSIS — O99332 Smoking (tobacco) complicating pregnancy, second trimester: Secondary | ICD-10-CM | POA: Diagnosis not present

## 2018-05-22 DIAGNOSIS — O283 Abnormal ultrasonic finding on antenatal screening of mother: Secondary | ICD-10-CM | POA: Insufficient documentation

## 2018-05-22 LAB — POCT URINALYSIS DIP (DEVICE)
BILIRUBIN URINE: NEGATIVE
Glucose, UA: NEGATIVE mg/dL
Hgb urine dipstick: NEGATIVE
KETONES UR: NEGATIVE mg/dL
Leukocytes, UA: NEGATIVE
Nitrite: NEGATIVE
Protein, ur: 30 mg/dL — AB
Specific Gravity, Urine: >=1.03 (ref 1.005–1.030)
Urobilinogen, UA: 0.2 mg/dL (ref 0.0–1.0)
pH: 6 (ref 5.0–8.0)

## 2018-05-22 MED ORDER — LABETALOL HCL 200 MG PO TABS: 400.0000 mg | ORAL_TABLET | Freq: Two times a day (BID) | ORAL | 3 refills | Status: DC

## 2018-05-22 NOTE — Patient Instructions (Signed)
Return to clinic for any scheduled appointments or obstetric concerns, or go to MAU for evaluation  

## 2018-05-22 NOTE — Progress Notes (Signed)
   PRENATAL VISIT NOTE  Subjective:  Shannon Gonzales is a 32 y.o. [phone] at 1649w4d being seen today for ongoing prenatal care.  She is currently monitored for the following issues for this high-risk pregnancy and has Hypertension in pregnancy, essential, antepartum, third trimester; History of preterm delivery, currently pregnant in second trimester; Supervision of high risk pregnancy, antepartum, second trimester; Insufficient prenatal care, third trimester; History of cesarean section, low transverse; Anxiety; Smoker; Marijuana use; and Marginal insertion of umbilical cord affecting management of mother in third trimester on their problem list.  Patient reports no complaints. Said while she was having sex, her partner felt "another hole" in there.  Contractions: Irregular. Vag. Bleeding: None.  Movement: Present. Denies leaking of fluid.   The following portions of the patient's history were reviewed and updated as appropriate: allergies, current medications, past family history, past medical history, past social history, past surgical history and problem list. Problem list updated.  Objective:   Vitals:   05/22/18 1333 05/22/18 1336  BP: (!) 164/98 (!) 151/97  Pulse: 84 77  Weight: 152 lb 11.2 oz (69.3 kg)     Fetal Status: Fetal Heart Rate (bpm): 139   Movement: Present     General:  Alert, oriented and cooperative. Patient is in no acute distress.  Skin: Skin is warm and dry. No rash noted.   Cardiovascular: Normal heart rate noted  Respiratory: Normal respiratory effort, no problems with respiration noted  Abdomen: Soft, gravid, appropriate for gestational age.  Pain/Pressure: Present     Pelvic: Cervical exam deferred Dilation: Closed Effacement (%): Thick    Extremities: Normal range of motion.  Edema: None  Mental Status: Normal mood and affect. Normal behavior. Normal judgment and thought content.   Assessment and Plan:  Pregnancy: L2G4010G8P1424 at 2449w4d  1. Hypertension in  pregnancy, essential, antepartum, third trimester Patient denies any headaches, visual symptoms, RUQ/epigastric pain. Increased Labetalol to 400 mg bid. Labs checked today.  - US MFM FETAL BPP WO NON STRESS; Future - labetalol (NORMODYNE) 200 MG tablet; Take 2 tablets (400 mg total) by mouth 2 (two) times daily.  Dispense: 60 tablet; Refill: 3 - Comprehensive metabolic panel - Protein / creatinine ratio, urine  2. Marginal insertion of umbilical cord affecting management of mother in third trimester Repeat scan today.  3. History of cesarean section, low transverse Has three VBACs, but undecided this time. Recommended VBAC strongly. Will sign consent next visit.   4. Insufficient prenatal care, third trimester Had too much "family stuff going on". Told about importance of coming to visits.  5. Supervision of high risk pregnancy, antepartum, second trimester Third trimester labs today. Undecided about Tdap. - CBC - RPR - HIV Antibody (routine testing w rflx) - Glucose tolerance, 1 hour Preterm labor symptoms and general obstetric precautions including but not limited to vaginal bleeding, contractions, leaking of fluid and fetal movement were reviewed in detail with the patient. Please refer to After Visit Summary for other counseling recommendations.  Return in about 1 week (around 05/29/2018) for BP check  2 weeks: HOB .  Future Appointments  Date Time Provider Department Center  05/22/2018  2:15 PM Ramsha Lonigro, Jethro BastosUgonna A, MD [facility]  05/22/2018  2:45 PM [facility] US 5 [facility]    Jaynie CollinsUgonna Wilburn Keir, MD

## 2018-05-23 ENCOUNTER — Encounter: Payer: Self-pay | Admitting: Obstetrics & Gynecology

## 2018-05-23 ENCOUNTER — Other Ambulatory Visit (HOSPITAL_COMMUNITY): Payer: Self-pay | Admitting: *Deleted

## 2018-05-23 DIAGNOSIS — O10919 Unspecified pre-existing hypertension complicating pregnancy, unspecified trimester: Secondary | ICD-10-CM

## 2018-05-23 DIAGNOSIS — Z8759 Personal history of other complications of pregnancy, childbirth and the puerperium: Secondary | ICD-10-CM

## 2018-05-23 LAB — CBC
HEMATOCRIT: 32.6 % — AB (ref 34.0–46.6)
Hemoglobin: 11 g/dL — ABNORMAL LOW (ref 11.1–15.9)
MCH: 30.7 pg (ref 26.6–33.0)
MCHC: 33.7 g/dL (ref 31.5–35.7)
MCV: 91 fL (ref 79–97)
Platelets: 244 10*3/uL (ref 150–450)
RBC: 3.58 x10E6/uL — ABNORMAL LOW (ref 3.77–5.28)
RDW: 13.1 % (ref 12.3–15.4)
WBC: 13 10*3/uL — ABNORMAL HIGH (ref 3.4–10.8)

## 2018-05-23 LAB — COMPREHENSIVE METABOLIC PANEL
A/G RATIO: 1.4 (ref 1.2–2.2)
ALT: 13 IU/L (ref 0–32)
AST: 29 IU/L (ref 0–40)
Albumin: 3.4 g/dL — ABNORMAL LOW (ref 3.5–5.5)
Alkaline Phosphatase: 102 IU/L (ref 39–117)
BUN/Creatinine Ratio: 16 (ref 9–23)
BUN: 8 mg/dL (ref 6–20)
Bilirubin Total: <0.2 mg/dL (ref 0.0–1.2)
CALCIUM: 9.2 mg/dL (ref 8.7–10.2)
CO2: 21 mmol/L (ref 20–29)
CREATININE: 0.49 mg/dL — AB (ref 0.57–1.00)
Chloride: 100 mmol/L (ref 96–106)
GFR, EST AFRICAN AMERICAN: 150 mL/min/{1.73_m2} (ref >59)
GFR, EST NON AFRICAN AMERICAN: 130 mL/min/{1.73_m2} (ref >59)
GLOBULIN, TOTAL: 2.4 g/dL (ref 1.5–4.5)
Glucose: 106 mg/dL — ABNORMAL HIGH (ref 65–99)
POTASSIUM: 3.6 mmol/L (ref 3.5–5.2)
SODIUM: 137 mmol/L (ref 134–144)
TOTAL PROTEIN: 5.8 g/dL — AB (ref 6.0–8.5)

## 2018-05-23 LAB — HIV ANTIBODY (ROUTINE TESTING W REFLEX): HIV Screen 4th Generation wRfx: NONREACTIVE

## 2018-05-23 LAB — PROTEIN / CREATININE RATIO, URINE
Creatinine, Urine: 203.8 mg/dL
PROTEIN/CREAT RATIO: 145 mg/g{creat} (ref 0–200)
Protein, Ur: 29.5 mg/dL

## 2018-05-23 LAB — RPR: RPR Ser Ql: NONREACTIVE

## 2018-05-23 LAB — GLUCOSE TOLERANCE, 1 HOUR: Glucose, 1Hr PP: 104 mg/dL (ref 65–199)

## 2018-05-29 ENCOUNTER — Encounter (HOSPITAL_COMMUNITY): Payer: Self-pay

## 2018-05-29 ENCOUNTER — Ambulatory Visit (HOSPITAL_COMMUNITY): Admission: RE | Admit: 2018-05-29 | Payer: Medicaid Other | Source: Ambulatory Visit

## 2018-06-04 ENCOUNTER — Inpatient Hospital Stay (HOSPITAL_COMMUNITY)
Admission: AD | Admit: 2018-06-04 | Discharge: 2018-06-07 | DRG: 807 | Disposition: A | Discharge status: Home / Self Care | Payer: Medicaid Other | Attending: Obstetrics and Gynecology | Admitting: Obstetrics and Gynecology

## 2018-06-04 ENCOUNTER — Encounter (HOSPITAL_COMMUNITY): Payer: Self-pay

## 2018-06-04 DIAGNOSIS — O119 Pre-existing hypertension with pre-eclampsia, unspecified trimester: Secondary | ICD-10-CM

## 2018-06-04 DIAGNOSIS — O09212 Supervision of pregnancy with history of pre-term labor, second trimester: Secondary | ICD-10-CM

## 2018-06-04 DIAGNOSIS — O0930 Supervision of pregnancy with insufficient antenatal care, unspecified trimester: Secondary | ICD-10-CM

## 2018-06-04 DIAGNOSIS — O0933 Supervision of pregnancy with insufficient antenatal care, third trimester: Secondary | ICD-10-CM

## 2018-06-04 DIAGNOSIS — F1290 Cannabis use, unspecified, uncomplicated: Secondary | ICD-10-CM | POA: Diagnosis present

## 2018-06-04 DIAGNOSIS — O09892 Supervision of other high risk pregnancies, second trimester: Secondary | ICD-10-CM

## 2018-06-04 DIAGNOSIS — Z98891 History of uterine scar from previous surgery: Secondary | ICD-10-CM

## 2018-06-04 DIAGNOSIS — O114 Pre-existing hypertension with pre-eclampsia, complicating childbirth: Principal | ICD-10-CM | POA: Diagnosis present

## 2018-06-04 DIAGNOSIS — F17210 Nicotine dependence, cigarettes, uncomplicated: Secondary | ICD-10-CM | POA: Diagnosis present

## 2018-06-04 DIAGNOSIS — O0992 Supervision of high risk pregnancy, unspecified, second trimester: Secondary | ICD-10-CM

## 2018-06-04 DIAGNOSIS — O43193 Other malformation of placenta, third trimester: Secondary | ICD-10-CM | POA: Diagnosis present

## 2018-06-04 DIAGNOSIS — F419 Anxiety disorder, unspecified: Secondary | ICD-10-CM | POA: Diagnosis present

## 2018-06-04 DIAGNOSIS — Z3A31 31 weeks gestation of pregnancy: Secondary | ICD-10-CM

## 2018-06-04 DIAGNOSIS — O283 Abnormal ultrasonic finding on antenatal screening of mother: Secondary | ICD-10-CM | POA: Diagnosis present

## 2018-06-04 DIAGNOSIS — O99334 Smoking (tobacco) complicating childbirth: Secondary | ICD-10-CM | POA: Diagnosis present

## 2018-06-04 DIAGNOSIS — O99324 Drug use complicating childbirth: Secondary | ICD-10-CM | POA: Diagnosis present

## 2018-06-04 DIAGNOSIS — O43123 Velamentous insertion of umbilical cord, third trimester: Secondary | ICD-10-CM | POA: Diagnosis present

## 2018-06-04 DIAGNOSIS — O10013 Pre-existing essential hypertension complicating pregnancy, third trimester: Secondary | ICD-10-CM | POA: Diagnosis present

## 2018-06-04 DIAGNOSIS — O1002 Pre-existing essential hypertension complicating childbirth: Secondary | ICD-10-CM | POA: Diagnosis present

## 2018-06-04 DIAGNOSIS — F17200 Nicotine dependence, unspecified, uncomplicated: Secondary | ICD-10-CM | POA: Diagnosis present

## 2018-06-04 DIAGNOSIS — O34219 Maternal care for unspecified type scar from previous cesarean delivery: Secondary | ICD-10-CM | POA: Diagnosis present

## 2018-06-04 MED ORDER — LABETALOL HCL 5 MG/ML IV SOLN
80.0000 mg | INTRAVENOUS | Status: DC | PRN
  Administered 2018-06-05: 80 mg via INTRAVENOUS
  Filled 2018-06-04: qty 16

## 2018-06-04 MED ORDER — LABETALOL HCL 5 MG/ML IV SOLN
20.0000 mg | INTRAVENOUS | Status: DC | PRN
  Administered 2018-06-05 – 2018-06-06 (×2): 20 mg via INTRAVENOUS
  Filled 2018-06-04 (×2): qty 4

## 2018-06-04 MED ORDER — BETAMETHASONE SOD PHOS & ACET 6 (3-3) MG/ML IJ SUSP
12.0000 mg | Freq: Once | INTRAMUSCULAR | Status: AC
  Administered 2018-06-05: 12 mg via INTRAMUSCULAR
  Filled 2018-06-04: qty 2

## 2018-06-04 MED ORDER — HYDRALAZINE HCL 20 MG/ML IJ SOLN
10.0000 mg | INTRAMUSCULAR | Status: DC | PRN
  Administered 2018-06-05 – 2018-06-06 (×2): 10 mg via INTRAVENOUS
  Filled 2018-06-04 (×2): qty 1

## 2018-06-04 MED ORDER — LABETALOL HCL 5 MG/ML IV SOLN
40.0000 mg | INTRAVENOUS | Status: DC | PRN
  Administered 2018-06-05 – 2018-06-06 (×2): 40 mg via INTRAVENOUS
  Filled 2018-06-04 (×3): qty 8

## 2018-06-04 NOTE — MAU Note (Signed)
Pt here with c/o contractions and high BP. Denies any leaking of fluid. Reports good fetal movement. Came by EMS

## 2018-06-05 ENCOUNTER — Encounter (HOSPITAL_COMMUNITY): Payer: Self-pay

## 2018-06-05 ENCOUNTER — Ambulatory Visit (HOSPITAL_COMMUNITY): Admission: RE | Admit: 2018-06-05 | Payer: Medicaid Other | Source: Ambulatory Visit

## 2018-06-05 ENCOUNTER — Other Ambulatory Visit: Payer: Self-pay

## 2018-06-05 DIAGNOSIS — Z3A31 31 weeks gestation of pregnancy: Secondary | ICD-10-CM

## 2018-06-05 DIAGNOSIS — O34219 Maternal care for unspecified type scar from previous cesarean delivery: Secondary | ICD-10-CM | POA: Diagnosis present

## 2018-06-05 DIAGNOSIS — O99334 Smoking (tobacco) complicating childbirth: Secondary | ICD-10-CM | POA: Diagnosis present

## 2018-06-05 DIAGNOSIS — O114 Pre-existing hypertension with pre-eclampsia, complicating childbirth: Secondary | ICD-10-CM | POA: Diagnosis present

## 2018-06-05 DIAGNOSIS — O1002 Pre-existing essential hypertension complicating childbirth: Secondary | ICD-10-CM | POA: Diagnosis present

## 2018-06-05 DIAGNOSIS — F17210 Nicotine dependence, cigarettes, uncomplicated: Secondary | ICD-10-CM | POA: Diagnosis present

## 2018-06-05 DIAGNOSIS — O43123 Velamentous insertion of umbilical cord, third trimester: Secondary | ICD-10-CM | POA: Diagnosis present

## 2018-06-05 DIAGNOSIS — F1290 Cannabis use, unspecified, uncomplicated: Secondary | ICD-10-CM | POA: Diagnosis present

## 2018-06-05 DIAGNOSIS — O1414 Severe pre-eclampsia complicating childbirth: Secondary | ICD-10-CM | POA: Diagnosis not present

## 2018-06-05 DIAGNOSIS — O99324 Drug use complicating childbirth: Secondary | ICD-10-CM | POA: Diagnosis present

## 2018-06-05 LAB — TYPE AND SCREEN
ABO/RH(D): B POS
Antibody Screen: NEGATIVE

## 2018-06-05 LAB — CBC
HCT: 36.1 % (ref 36.0–46.0)
Hemoglobin: 11.8 g/dL — ABNORMAL LOW (ref 12.0–15.0)
MCH: 31 pg (ref 26.0–34.0)
MCHC: 32.7 g/dL (ref 30.0–36.0)
MCV: 94.8 fL (ref 80.0–100.0)
Platelets: 276 10*3/uL (ref 150–400)
RBC: 3.81 MIL/uL — ABNORMAL LOW (ref 3.87–5.11)
RDW: 14.4 % (ref 11.5–15.5)
WBC: 15.9 10*3/uL — ABNORMAL HIGH (ref 4.0–10.5)
nRBC: 0 % (ref 0.0–0.2)

## 2018-06-05 LAB — URINALYSIS, ROUTINE W REFLEX MICROSCOPIC
Bacteria, UA: NONE SEEN
Bilirubin Urine: NEGATIVE
Glucose, UA: NEGATIVE mg/dL
Ketones, ur: NEGATIVE mg/dL
Leukocytes, UA: NEGATIVE
Nitrite: NEGATIVE
Protein, ur: 100 mg/dL — AB
Specific Gravity, Urine: 1.021 (ref 1.005–1.030)
pH: 6 (ref 5.0–8.0)

## 2018-06-05 LAB — COMPREHENSIVE METABOLIC PANEL
ALT: 13 U/L (ref 0–44)
AST: 32 U/L (ref 15–41)
Albumin: 2.8 g/dL — ABNORMAL LOW (ref 3.5–5.0)
Alkaline Phosphatase: 120 U/L (ref 38–126)
Anion gap: 8 (ref 5–15)
BUN: 12 mg/dL (ref 6–20)
CO2: 22 mmol/L (ref 22–32)
Calcium: 9.3 mg/dL (ref 8.9–10.3)
Chloride: 104 mmol/L (ref 98–111)
Creatinine, Ser: 0.46 mg/dL (ref 0.44–1.00)
GFR calc Af Amer: >60 mL/min (ref >60)
GFR calc non Af Amer: >60 mL/min (ref >60)
Glucose, Bld: 83 mg/dL (ref 70–99)
Potassium: 4 mmol/L (ref 3.5–5.1)
Sodium: 134 mmol/L — ABNORMAL LOW (ref 135–145)
Total Bilirubin: 0.3 mg/dL (ref 0.3–1.2)
Total Protein: 6.3 g/dL — ABNORMAL LOW (ref 6.5–8.1)

## 2018-06-05 LAB — PROTEIN / CREATININE RATIO, URINE
Creatinine, Urine: 86 mg/dL
Protein Creatinine Ratio: 3.02 mg/mg{Cre} — ABNORMAL HIGH (ref 0.00–0.15)
Total Protein, Urine: 260 mg/dL

## 2018-06-05 LAB — RPR: RPR Ser Ql: NONREACTIVE

## 2018-06-05 MED ORDER — SENNOSIDES-DOCUSATE SODIUM 8.6-50 MG PO TABS
2.0000 | ORAL_TABLET | ORAL | Status: DC
  Administered 2018-06-06 (×2): 2 via ORAL
  Filled 2018-06-05 (×2): qty 2

## 2018-06-05 MED ORDER — DIPHENHYDRAMINE HCL 25 MG PO CAPS: 25.0000 mg | ORAL_CAPSULE | Freq: Four times a day (QID) | ORAL | Status: DC | PRN

## 2018-06-05 MED ORDER — DIBUCAINE 1 % RE OINT: 1.0000 "application " | TOPICAL_OINTMENT | RECTAL | Status: DC | PRN

## 2018-06-05 MED ORDER — ONDANSETRON HCL 4 MG PO TABS: 4.0000 mg | ORAL_TABLET | ORAL | Status: DC | PRN

## 2018-06-05 MED ORDER — LACTATED RINGERS IV SOLN: INTRAVENOUS | Status: DC

## 2018-06-05 MED ORDER — SOD CITRATE-CITRIC ACID 500-334 MG/5ML PO SOLN: 30.0000 mL | ORAL | Status: DC | PRN

## 2018-06-05 MED ORDER — FENTANYL CITRATE (PF) 100 MCG/2ML IJ SOLN
100.0000 ug | INTRAMUSCULAR | Status: DC | PRN
  Administered 2018-06-05: 100 ug via INTRAVENOUS
  Filled 2018-06-05: qty 2

## 2018-06-05 MED ORDER — IBUPROFEN 600 MG PO TABS
600.0000 mg | ORAL_TABLET | Freq: Four times a day (QID) | ORAL | Status: DC
  Administered 2018-06-05 – 2018-06-07 (×10): 600 mg via ORAL
  Filled 2018-06-05 (×10): qty 1

## 2018-06-05 MED ORDER — OXYCODONE-ACETAMINOPHEN 5-325 MG PO TABS
1.0000 | ORAL_TABLET | ORAL | Status: DC | PRN
  Administered 2018-06-05: 1 via ORAL
  Filled 2018-06-05: qty 1

## 2018-06-05 MED ORDER — MAGNESIUM SULFATE 40 G IN LACTATED RINGERS - SIMPLE
2.0000 g/h | INTRAVENOUS | Status: AC
  Administered 2018-06-05: 2 g/h via INTRAVENOUS
  Filled 2018-06-05 (×2): qty 500

## 2018-06-05 MED ORDER — ONDANSETRON HCL 4 MG/2ML IJ SOLN: 4.0000 mg | INTRAMUSCULAR | Status: DC | PRN

## 2018-06-05 MED ORDER — MAGNESIUM SULFATE BOLUS VIA INFUSION
6.0000 g | Freq: Once | INTRAVENOUS | Status: AC
  Administered 2018-06-05: 6 g via INTRAVENOUS
  Filled 2018-06-05: qty 500

## 2018-06-05 MED ORDER — COCONUT OIL OIL: 1.0000 "application " | TOPICAL_OIL | Status: DC | PRN

## 2018-06-05 MED ORDER — BENZOCAINE-MENTHOL 20-0.5 % EX AERO: 1.0000 "application " | INHALATION_SPRAY | CUTANEOUS | Status: DC | PRN

## 2018-06-05 MED ORDER — ONDANSETRON HCL 4 MG/2ML IJ SOLN: 4.0000 mg | Freq: Four times a day (QID) | INTRAMUSCULAR | Status: DC | PRN

## 2018-06-05 MED ORDER — LACTATED RINGERS IV SOLN
INTRAVENOUS | Status: DC
  Administered 2018-06-05: 12:00:00 via INTRAVENOUS

## 2018-06-05 MED ORDER — ACETAMINOPHEN 325 MG PO TABS: 650.0000 mg | ORAL_TABLET | ORAL | Status: DC | PRN

## 2018-06-05 MED ORDER — ACETAMINOPHEN 325 MG PO TABS
650.0000 mg | ORAL_TABLET | ORAL | Status: DC | PRN
  Administered 2018-06-05 (×2): 650 mg via ORAL
  Filled 2018-06-05 (×2): qty 2

## 2018-06-05 MED ORDER — LACTATED RINGERS IV SOLN: 500.0000 mL | INTRAVENOUS | Status: DC | PRN

## 2018-06-05 MED ORDER — TETANUS-DIPHTH-ACELL PERTUSSIS 5-2.5-18.5 LF-MCG/0.5 IM SUSP: 0.5000 mL | Freq: Once | INTRAMUSCULAR | Status: DC

## 2018-06-05 MED ORDER — OXYCODONE-ACETAMINOPHEN 5-325 MG PO TABS: 2.0000 | ORAL_TABLET | ORAL | Status: DC | PRN

## 2018-06-05 MED ORDER — ENALAPRIL MALEATE 10 MG PO TABS
10.0000 mg | ORAL_TABLET | Freq: Every day | ORAL | Status: DC
  Administered 2018-06-05: 10 mg via ORAL
  Filled 2018-06-05 (×2): qty 1

## 2018-06-05 MED ORDER — MAGNESIUM SULFATE 40 G IN LACTATED RINGERS - SIMPLE
2.0000 g/h | INTRAVENOUS | Status: DC
  Filled 2018-06-05: qty 500

## 2018-06-05 MED ORDER — MEASLES, MUMPS & RUBELLA VAC IJ SOLR
0.5000 mL | Freq: Once | INTRAMUSCULAR | Status: DC
  Filled 2018-06-05: qty 0.5

## 2018-06-05 MED ORDER — WITCH HAZEL-GLYCERIN EX PADS: 1.0000 "application " | MEDICATED_PAD | CUTANEOUS | Status: DC | PRN

## 2018-06-05 MED ORDER — LIDOCAINE HCL (PF) 1 % IJ SOLN
30.0000 mL | INTRAMUSCULAR | Status: DC | PRN
  Filled 2018-06-05: qty 30

## 2018-06-05 MED ORDER — PRENATAL MULTIVITAMIN CH
1.0000 | ORAL_TABLET | Freq: Every day | ORAL | Status: DC
  Administered 2018-06-05 – 2018-06-07 (×3): 1 via ORAL
  Filled 2018-06-05 (×3): qty 1

## 2018-06-05 MED ORDER — CEFAZOLIN SODIUM-DEXTROSE 2-4 GM/100ML-% IV SOLN
2.0000 g | Freq: Three times a day (TID) | INTRAVENOUS | Status: DC
  Administered 2018-06-05 (×2): 2 g via INTRAVENOUS
  Filled 2018-06-05 (×4): qty 100

## 2018-06-05 MED ORDER — SODIUM CHLORIDE 0.9 % IV SOLN
2.0000 g | Freq: Once | INTRAVENOUS | Status: AC
  Administered 2018-06-05: 2 g via INTRAVENOUS
  Filled 2018-06-05: qty 2

## 2018-06-05 MED ORDER — OXYTOCIN 40 UNITS IN LACTATED RINGERS INFUSION - SIMPLE MED
2.5000 [IU]/h | INTRAVENOUS | Status: DC
  Filled 2018-06-05: qty 1000

## 2018-06-05 MED ORDER — OXYCODONE-ACETAMINOPHEN 5-325 MG PO TABS
1.0000 | ORAL_TABLET | Freq: Four times a day (QID) | ORAL | Status: DC | PRN
  Administered 2018-06-05 – 2018-06-07 (×7): 2 via ORAL
  Filled 2018-06-05 (×7): qty 2

## 2018-06-05 MED ORDER — ZOLPIDEM TARTRATE 5 MG PO TABS: 5.0000 mg | ORAL_TABLET | Freq: Every evening | ORAL | Status: DC | PRN

## 2018-06-05 MED ORDER — SIMETHICONE 80 MG PO CHEW: 80.0000 mg | CHEWABLE_TABLET | ORAL | Status: DC | PRN

## 2018-06-05 MED ORDER — OXYTOCIN BOLUS FROM INFUSION
500.0000 mL | Freq: Once | INTRAVENOUS | Status: AC
  Administered 2018-06-05: 500 mL via INTRAVENOUS

## 2018-06-05 NOTE — Lactation Note (Signed)
This note was copied from a baby's chart. Lactation Consultation Note  Patient Name: Shannon Gonzales Reason for consult: Initial assessment;Preterm <34wks;NICU baby Breastfeeding consultation services and support information given to patient. Providing Breastmilk For Your Baby in NICU booklet at bedside.  Mom states this is her fifth baby and she breastfed previous babies.  Mom is comfortable pumping and hand expressing and last obtained 10 mls.  Instructed to continue both 8-12 times/24 hours.  [place]IC [place]  Referral faxed to office for a pump after discharge.  Encouraged to call for assist/concerns prn.  Maternal Data    Feeding    LATCH Score                   Interventions    Lactation Tools Discussed/Used WIC Program: Yes Pump Review: Setup, frequency, and cleaning;Milk Storage Initiated by:: RN Date initiated:: 06/05/18   Consult Status Consult Status: Follow-up Date: 06/06/18 Follow-up type: In-patient    Huston FoleyMOULDEN, Estephania Licciardi S Gonzales, 12:18 PM

## 2018-06-05 NOTE — H&P (Signed)
OBSTETRIC ADMISSION HISTORY AND PHYSICAL  Shannon Gonzales is a 32 y.o. female [phone] with IUP at  7114w4d by L/22 who presented to MAU by EMS for contractions. In route, elevated systolic pressures in 200s. Unable to answer all questions in MAU, crying that feeling lots of pressure.   [facility]  Support person in labor: partner  Ultrasounds  22w4: normal anatomy U/S with normal interval growth, suboptimal cardiac views   29w4: EWF 1092g (18%), appropriate interval fetal growth, elevated UA dopplers (>97%)  Prenatal History/Complications: . CHTN: reports taking ASA, irregularly taking Labetalol  . Marginal cord insertion . Tobacco use . Anxiety . History of preterm delivery . History of prior cesarean deliveries with recent VBAC x 3, TOLAC consent not signed due to precipitous delivery   Past Medical History: Past Medical History:  Diagnosis Date  . Anxiety   . Depression   . Drug abuse, marijuana    with pregnancy  . Hx of PTL (preterm labor), current pregnancy   . Hypertension   . Late prenatal care   . Trichimoniasis    with pregnancy    Past Surgical History: Past Surgical History:  Procedure Laterality Date  . CESAREAN SECTION      Obstetrical History: OB History    Gravida  8   Para  6   Term  1   Preterm  5   AB  2   Living  5     SAB  1   TAB  1   Ectopic      Multiple  0   Live Births  2           Social History: Social History   Socioeconomic History  . Marital status: Single    Spouse name: Not on file  . Number of children: Not on file  . Years of education: Not on file  . Highest education level: Not on file  Occupational History  . Not on file  Social Needs  . Financial resource strain: Not on file  . Food insecurity:    Worry: Not on file    Inability: Not on file  . Transportation needs:    Medical: Not on file    Non-medical: Not on file  Tobacco Use  . Smoking status: Current Every  Day Smoker    Packs/day: 0.10    Types: Cigarettes  . Smokeless tobacco: Never Used  Substance and Sexual Activity  . Alcohol use: No  . Drug use: Yes    Types: Hashish, Marijuana  . Sexual activity: Yes    Birth control/protection: None  Lifestyle  . Physical activity:    Days per week: Not on file    Minutes per session: Not on file  . Stress: Not on file  Relationships  . Social connections:    Talks on phone: Not on file    Gets together: Not on file    Attends religious service: Not on file    Active member of club or organization: Not on file    Attends meetings of clubs or organizations: Not on file    Relationship status: Not on file  Other Topics Concern  . Not on file  Social History Narrative  . Not on file    Family History: Family History  Problem Relation Age of Onset  . Hypertension Mother     Allergies: No Known Allergies  Medications Prior to Admission  Medication Sig Dispense Refill Last Dose  .  aspirin 81 MG chewable tablet Chew 1 tablet (81 mg total) by mouth daily. 90 tablet 3 Taking  . Elastic Bandages & Supports (COMFORT FIT MATERNITY SUPP LG) MISC 1 Units by Does not apply route daily as needed. (Patient not taking: Reported on 05/22/2018) 1 each 0 Not Taking  . labetalol (NORMODYNE) 200 MG tablet Take 2 tablets (400 mg total) by mouth 2 (two) times daily. 60 tablet 3 Taking  . Prenat-FePro-Methf-Ca-Omega 3 (PRENATAL + COMPLETE MULTI) 18-0.8 & 290 MG THPK Take 1 tablet by mouth daily. 60 each 6 Taking  . zolpidem (AMBIEN) 5 MG tablet Take 1 tablet (5 mg total) by mouth at bedtime as needed for sleep. 10 tablet 0 Taking     Review of Systems  All systems reviewed and negative except as stated in HPI  Blood pressure (!) 215/127, pulse 80, temperature 98 F (36.7 C), temperature source Oral, resp. rate 20, height 5\' 1"  (1.549 m), last menstrual period 10/27/2017, SpO2 97 %, unknown if currently breastfeeding. General appearance: yelling in  pain, writing around in bed Lungs: no respiratory distress Heart: regular rate  Abdomen: soft, non-tender; gravid Pelvic: deferred Extremities: no obvious edema Presentation: cephalic by sutures Fetal monitoring: 120s/min-mod/-a/-d Uterine activity: irregular, palpates firm  Dilation: 6 Effacement (%): 90 Station: -2 Exam by:: L Earlene Plater DO  Prenatal labs: ABO, Rh: --/--/B POS (11/26 2343) Antibody: NEG (11/26 2343) Rubella: Immune (08/01 0000) RPR: Non Reactive (11/13 1438)  HBsAg: Negative (08/01 0000)  HIV: Non Reactive (11/13 1438)  GBS:   not done Glucola: normal 1hr (104) Genetic screening:  Low risk NIPS  Prenatal Transfer Tool  Maternal Diabetes: No Genetic Screening: Normal Maternal Ultrasounds/Referrals: Abnormal:  Findings:   Other: normal fetal growth, elevated umbilical artery dopplers, marginal cord insertion Fetal Ultrasounds or other Referrals:  None Maternal Substance Abuse:  Tobacco use Significant Maternal Medications:  Aspirin, labetalol Significant Maternal Lab Results: None  Results for orders placed or performed during the hospital encounter of 06/04/18 (from the past 24 hour(s))  CBC   Collection Time: 06/04/18 11:42 PM  Result Value Ref Range   WBC 15.9 (H) 4.0 - 10.5 K/uL   RBC 3.81 (L) 3.87 - 5.11 MIL/uL   Hemoglobin 11.8 (L) 12.0 - 15.0 g/dL   HCT 16.1 09.6 - 04.5 %   MCV 94.8 80.0 - 100.0 fL   MCH 31.0 26.0 - 34.0 pg   MCHC 32.7 30.0 - 36.0 g/dL   RDW 40.9 81.1 - 91.4 %   Platelets 276 150 - 400 K/uL   nRBC 0.0 0.0 - 0.2 %  Comprehensive metabolic panel   Collection Time: 06/04/18 11:42 PM  Result Value Ref Range   Sodium 134 (L) 135 - 145 mmol/L   Potassium 4.0 3.5 - 5.1 mmol/L   Chloride 104 98 - 111 mmol/L   CO2 22 22 - 32 mmol/L   Glucose, Bld 83 70 - 99 mg/dL   BUN 12 6 - 20 mg/dL   Creatinine, Ser 7.82 0.44 - 1.00 mg/dL   Calcium 9.3 8.9 - 95.6 mg/dL   Total Protein 6.3 (L) 6.5 - 8.1 g/dL   Albumin 2.8 (L) 3.5 - 5.0 g/dL    AST 32 15 - 41 U/L   ALT 13 0 - 44 U/L   Alkaline Phosphatase 120 38 - 126 U/L   Total Bilirubin 0.3 0.3 - 1.2 mg/dL   GFR calc non Af Amer >60 >60 mL/min   GFR calc Af Amer >60 >60 mL/min  Anion gap 8 5 - 15  Type and screen [facility]   Collection Time: 06/04/18 11:43 PM  Result Value Ref Range   ABO/RH(D) B POS    Antibody Screen NEG    Sample Expiration      06/07/2018 Performed at [facility], [address], [place], [place] [address]   Urinalysis, Routine w reflex microscopic   Collection Time: 06/04/18 11:57 PM  Result Value Ref Range   Color, Urine YELLOW YELLOW   APPearance CLEAR CLEAR   Specific Gravity, Urine 1.021 1.005 - 1.030   pH 6.0 5.0 - 8.0   Glucose, UA NEGATIVE NEGATIVE mg/dL   Hgb urine dipstick SMALL (A) NEGATIVE   Bilirubin Urine NEGATIVE NEGATIVE   Ketones, ur NEGATIVE NEGATIVE mg/dL   Protein, ur 604 (A) NEGATIVE mg/dL   Nitrite NEGATIVE NEGATIVE   Leukocytes, UA NEGATIVE NEGATIVE   RBC / HPF 0-5 0 - 5 RBC/hpf   WBC, UA 0-5 0 - 5 WBC/hpf   Bacteria, UA NONE SEEN NONE SEEN   Squamous Epithelial / LPF 0-5 0 - 5   Mucus PRESENT   Protein / creatinine ratio, urine   Collection Time: 06/04/18 11:57 PM  Result Value Ref Range   Creatinine, Urine 86.00 mg/dL   Total Protein, Urine 260 mg/dL   Protein Creatinine Ratio 3.02 (H) 0.00 - 0.15 mg/mg[Cre]    Patient Active Problem List   Diagnosis Date Noted  . Preterm labor in third trimester 06/05/2018  . Elevated fetal umbilical artery dopplers on ultrasound 05/22/2018  . Marginal insertion of umbilical cord affecting management of mother in third trimester 04/04/2018  . Hypertension in pregnancy, essential, antepartum, third trimester 03/28/2018  . History of preterm delivery, currently pregnant in second trimester 03/28/2018  . Supervision of high risk pregnancy, antepartum, second trimester 03/28/2018  . Insufficient prenatal care, third trimester 03/28/2018  . History of  cesarean section, low transverse 03/28/2018  . Anxiety 03/28/2018  . Smoker 03/28/2018  . Marijuana use 03/28/2018    Assessment/Plan:  Shannon Gonzales is a 32 y.o. [id number] at [redacted]w[redacted]d here for active labor, superimposed preeclampsia.  Labor: Expectant management. Given rapid change and BBOW, expect delivery soon.  -- pain control: desires fentanyl if time  Fetal Wellbeing: EFW EWF 1092g (18%) at 29w4. Cephalic by sutures.  -- GBS (unknown) - will give ampicillin  -- continuous fetal monitoring - category I  -- BMZ given   Severe Superimposed Preeclampsia: BP 200s systolic in arrival. Denies headache, vision changes, just reports contraction pain. UPC 3.02, CBC/CMP wnl.  -- start Mg++ bolus, then 2g infusion -- follow-up labetalol protocol   Postpartum Planning -- both/Depo -- RI/[not given]Tdap   Kambra Beachem S. Earlene Plater, DO OB/GYN Fellow

## 2018-06-06 MED ORDER — MEDROXYPROGESTERONE ACETATE 150 MG/ML IM SUSP
150.0000 mg | Freq: Once | INTRAMUSCULAR | Status: AC
  Administered 2018-06-06: 150 mg via INTRAMUSCULAR
  Filled 2018-06-06: qty 1

## 2018-06-06 MED ORDER — AMLODIPINE BESYLATE 10 MG PO TABS
10.0000 mg | ORAL_TABLET | Freq: Every day | ORAL | Status: DC
  Administered 2018-06-06 – 2018-06-07 (×2): 10 mg via ORAL
  Filled 2018-06-06 (×2): qty 1

## 2018-06-06 MED ORDER — ENALAPRIL MALEATE 20 MG PO TABS
20.0000 mg | ORAL_TABLET | Freq: Every day | ORAL | Status: DC
  Administered 2018-06-06 – 2018-06-07 (×2): 20 mg via ORAL
  Filled 2018-06-06 (×2): qty 1

## 2018-06-06 MED ORDER — BISACODYL 10 MG RE SUPP
10.0000 mg | Freq: Once | RECTAL | Status: AC
  Administered 2018-06-06: 10 mg via RECTAL
  Filled 2018-06-06: qty 1

## 2018-06-06 NOTE — Progress Notes (Signed)
Post Partum Day 1, after precip delivery VBAC, at 00:40 Wed.   Subjective: no complaints, up ad lib and tolerating PO needed laxative for constipation. Unstable housing, needs depo in house as she does not know where she will be living and thinks this will affect ability for timely f/u. Will Rx Depo prior to d/c Objective: Temp:  [97.6 F (36.4 C)-98.3 F (36.8 C)] 98.2 F (36.8 C) (11/28 0504) Pulse Rate:  [69-80] 69 (11/28 0504) Resp:  [16-18] 17 (11/28 0504) BP: (142-160)/(91-111) 145/97 (11/28 0504) SpO2:  [98 %-100 %] 100 % (11/28 0504)  Blood pressure (!) 145/97, pulse 69, temperature 98.2 F (36.8 C), temperature source Oral, resp. rate 17, height 5\' 1"  (1.549 m), weight 69.9 kg, last menstrual period 10/27/2017, SpO2 100 %, unknown if currently breastfeeding.  Physical Exam:  General: alert, cooperative and no distress Lochia: appropriate Uterine Fundus: firm u-2 Incision:  DVT Evaluation: No evidence of DVT seen on physical exam. Negative Homan's sign.  Recent Labs    06/04/18 2342  HGB 11.8*  HCT 36.1    Assessment/Plan: Plan for discharge tomorrow, Breastfeeding, Social Work consult and Contraception depo prior to d/c   LOS: 1 day   Tilda BurrowJohn V Reather Steller 06/06/2018, 7:23 AM

## 2018-06-07 DIAGNOSIS — O119 Pre-existing hypertension with pre-eclampsia, unspecified trimester: Secondary | ICD-10-CM

## 2018-06-07 MED ORDER — AMLODIPINE BESYLATE 10 MG PO TABS: 10.0000 mg | ORAL_TABLET | Freq: Every day | ORAL | 2 refills | Status: DC

## 2018-06-07 MED ORDER — ENALAPRIL MALEATE 20 MG PO TABS: 20.0000 mg | ORAL_TABLET | Freq: Every day | ORAL | 2 refills | Status: DC

## 2018-06-07 MED ORDER — OXYCODONE-ACETAMINOPHEN 5-325 MG PO TABS: 1.0000 | ORAL_TABLET | Freq: Four times a day (QID) | ORAL | 0 refills | Status: DC | PRN

## 2018-06-07 MED ORDER — IBUPROFEN 600 MG PO TABS: 600.0000 mg | ORAL_TABLET | Freq: Four times a day (QID) | ORAL | 0 refills | Status: DC

## 2018-06-07 NOTE — Lactation Note (Signed)
This note was copied from a baby's chart. Lactation Consultation Note: Experienced BF mom is pumping as I went into room. Reports breasts are feeling fuller this morning. [facility] and mom states she does not have money for that. Can use pump pieces as manual pump and use pump in NICU when visiting baby. No questions at present. To call for assist when baby ready to go to the breast.   Patient Name: Girl Joan MayansDenise Hollern WUJWJ'XToday's Date: 06/07/2018 Reason for consult: Follow-up assessment;Preterm <34wks   Maternal Data Formula Feeding for Exclusion: Yes Reason for exclusion: Mother's choice to formula and breast feed on admission Has patient been taught Hand Expression?: Yes Does the patient have breastfeeding experience prior to this delivery?: Yes  Feeding    LATCH Score                   Interventions    Lactation Tools Discussed/Used WIC Program: Yes   Consult Status Consult Status: Complete    Pamelia HoitWeeks, Hernan Turnage D 06/07/2018, 9:19 AM

## 2018-06-07 NOTE — Progress Notes (Signed)
Discharge written earlier this morning pending SW consult.  Pt had 2 severe range BPs:  156/117 at 8:19am and 150/110 at 8:44am.  Pt with enalapril and amlodipine due this morning at 10:00am, gave them early at 8:46am.  Will plan to recheck BP at 10:00am.    Phoned Dr. Macon LargeAnyanwu with above information.  She asks to be notified of 10:00am BP.  Also spoke to BoykinAngel, CSW who said she would see patient today.

## 2018-06-07 NOTE — Progress Notes (Signed)
Pt discharged to home with significant other.  Condition stable.  Pt ambulated to NICU with plans to leave hospital from there.  Pt home with breast pump parts and hand pump.  [facility] No equipment ordered for home at discharge.

## 2018-06-07 NOTE — Progress Notes (Signed)
CSW acknowledges consult and completed clinical assessment.  Clinical documentation will follow.  There are no barriers to d/c.  Missey Hasley Boyd-Gilyard, MSW, LCSW Clinical Social Work [phone]   

## 2018-06-07 NOTE — Discharge Summary (Signed)
Physician Discharge Summary  Patient ID: Shannon Gonzales MRN: [id number] DOB/AGE: 03/24/1986 32 y.o.  Admit date: 06/04/2018 Discharge date: 06/07/2018   Discharge Diagnoses:  Principal Problem:   Preterm labor in third trimester Active Problems:   Hypertension in pregnancy, essential, antepartum, third trimester   History of preterm delivery, currently pregnant in second trimester   Supervision of high risk pregnancy, antepartum, second trimester   Insufficient prenatal care, third trimester   History of cesarean section, low transverse   Anxiety   Smoker   Marijuana use   Marginal insertion of umbilical cord affecting management of mother in third trimester   Elevated fetal umbilical artery dopplers on ultrasound   Chronic hypertension with superimposed preeclampsia   Hospital Course: Please see HPI dated 06/04/2018 for details. This is a 32 y.o. [id number] now PPD#2 from a spontaneous vaginal birth after c-section. She presented in spontaneous labor at 31 weeks. Her antepartum course was complicated by chronic hypertension, noted while in MAU to have superimposed pre-eclampsia with severe features. Uncomplicated delivery and she received 24 hrs MgSO4 post partum, started on enalapril and vasotec post partum. Her post partum course was otherwise uncomplicated. By day 2, she was ambulating, passing flatus and pain was well controlled. She was discharged home PPD#2 in good condition. Baby remains in NICU for prematurity, and is doing well. Instructions for follow up given. She will have BP check at office next week.   Physical exam  Vitals:   06/06/18 1618 06/06/18 1938 06/06/18 2320 06/07/18 0526  BP: 121/86 (!) 130/93 (!) 143/95 (!) 142/88  Pulse: 72 70 61 65  Resp: 16 20 20 20   Temp: 98.7 F (37.1 C) 98.6 F (37 C) 98.1 F (36.7 C) 98.4 F (36.9 C)  TempSrc: Oral Oral Oral Oral  SpO2: 100% 100% 97% 100%  Weight:      Height:       Physical Exam:  General: alert,  oriented, cooperative Chest: CTAB, normal respiratory effort Heart: RRR  Abdomen: +BS, soft, appropriately tender to palpation  Uterine Fundus: firm, 2 fingers below the umbilicus Lochia: moderate, rubra DVT Evaluation: no evidence of DVT Extremities: no edema, no calf tenderness   Postpartum contraception: Depo Provera while in hospital  Disposition: home  Discharged Condition: good  Discharge Instructions    Call MD for:  difficulty breathing, headache or visual disturbances   Complete by:  As directed    Call MD for:  extreme fatigue   Complete by:  As directed    Call MD for:  hives   Complete by:  As directed    Call MD for:  persistant dizziness or light-headedness   Complete by:  As directed    Call MD for:  persistant nausea and vomiting   Complete by:  As directed    Call MD for:  redness, tenderness, or signs of infection (pain, swelling, redness, odor or green/yellow discharge around incision site)   Complete by:  As directed    Call MD for:  severe uncontrolled pain   Complete by:  As directed    Call MD for:  temperature >100.4   Complete by:  As directed    Diet - low sodium heart healthy   Complete by:  As directed      Allergies as of 06/07/2018   No Known Allergies     Medication List    STOP taking these medications   aspirin 81 MG chewable tablet   COMFORT FIT MATERNITY SUPP LG  Misc   labetalol 200 MG tablet Commonly known as:  NORMODYNE   zolpidem 5 MG tablet Commonly known as:  AMBIEN     TAKE these medications   amLODipine 10 MG tablet Commonly known as:  NORVASC Take 1 tablet (10 mg total) by mouth daily.   enalapril 20 MG tablet Commonly known as:  VASOTEC Take 1 tablet (20 mg total) by mouth daily.   ibuprofen 600 MG tablet Commonly known as:  ADVIL,MOTRIN Take 1 tablet (600 mg total) by mouth every 6 (six) hours.   oxyCODONE-acetaminophen 5-325 MG tablet Commonly known as:  PERCOCET/ROXICET Take 1 tablet by mouth every 6  (six) hours as needed (headache).   PRENATAL + COMPLETE MULTI 18-0.8 & 290 MG Thpk Take 1 tablet by mouth daily.      Follow-up Information    [facility]. Go on 06/11/2018.   Specialty:  Obstetrics and Gynecology Contact information: [address] [place] [address] [phone]          Signed: Baldemar Lenis, M.D. Attending [facility] [employer] (Faculty Practice)  06/07/2018, 7:22 AM

## 2018-06-07 NOTE — Progress Notes (Signed)
Dr. Macon LargeAnyanwu notified of BP.  Patient cleared for discharge per MD.

## 2018-06-08 NOTE — Clinical Social Work Maternal (Addendum)
CLINICAL SOCIAL WORK MATERNAL/CHILD NOTE  Patient Details  Name: Shannon Gonzales MRN: [id number] Date of Birth: [birth year]  Date:  06/08/2018  Clinical Social Worker Initiating Note:  Laurey Arrow Date/Time: Initiated:  06/07/18/1156     Child's Name:  Shannon Gonzales   Biological Parents:  Mother, Father   Need for Interpreter:  None   Reason for Referral:  Behavioral Health Concerns, Current Substance Use/Substance Use During Pregnancy    Address:  [address] [place]  [address]    Phone number:  [phone] (home)     Additional phone number:   Household Members/Support Persons (HM/SP):   Household Member/Support Person 1, Household Member/Support Person 2, Household Member/Support Person 3, Household Member/Support Person 4, Household Member/Support Person 5   HM/SP Name Relationship DOB or Age  HM/SP -[place] FOB 04/17/1987  HM/SP -2 Octavio Manns Wall daughter 03/29/2006  HM/SP -3 Marcee Jacobs daughter 03/20/2007  HM/SP -4 Mal Misty daughter 06/29/09  HM/SP -5 Destin Lwin(MOB's resides with his father.  Per MOB, CPS had no involvement. ) son 05/07/2013  HM/SP -6        HM/SP -7        HM/SP -8          Natural Supports (not living in the home):  Immediate Family, Friends, Extended Family(Per MOB, FOB's family will also provide support. )   Professional Supports: Case Manager/Social Worker, Therapist(MOB has a Tourist information centre manager and receives outpatient counseling through [employer]. )   Employment: Unemployed   Type of Work:     Education:  Programmer, systems   Homebound arranged:    Museum/gallery curator Resources:  Medicaid   Other Resources:  Physicist, medical , [place] Considerations Which May Impact Care:  None Reported  Strengths:  Ability to meet basic needs , Engineer, materials, Home prepared for child , Understanding of illness   Psychotropic Medications:         Pediatrician:    Solicitor  area  Pediatrician List:   [facility] ([address])  [place]      Pediatrician Fax Number:    Risk Factors/Current Problems:  Transportation , Substance Use , Mental Health Concerns , Other (Comment)(MOB is currently living in an apartment that is afflicated with [facility]. It's not permanet housing. )   Cognitive State:  Able to Concentrate , Alert , Linear Thinking , Insightful , Goal Oriented    Mood/Affect:  Interested , Happy , Relaxed , Comfortable , Bright    CSW Assessment: CSW met with MOB in  Room 317 to offer support and complete assessment due to hx of Anxiety, marijuana use, and NICU admission  MOB was easy to engage, forthcoming, and pleasant.  When CSW arrived, MOB was eating lunch and FOB was relaxing on the couch.  With MOB's permission, CSW asked FOB to leave in order to meet with MOB in private.   CSW asked about MOB's thought and feelings regarding infant's NICU admission.  MOB reported feeling fine and that this is MOB's 2nd child born prematurely.  MOB shared MOB has a 28 weeker in 2008. CSW reviewed NICU visitation policy and assessed for visitation barriers. MOB reported having limited transportation and appeared to be worried about she will get her daily to visit with infant.  CSW  informed  MOB of Medicaid transportation and encouraged MOB to call on MOB to apply; MOB agreed. CSW also offered MOB bus passes to utilized until [employer] is approved; MOB was very Patent attorney.   MOB reports that she has everything needed for baby with the exception of a car seat (MOB is not eligible for [place] program due to receiving one in the past).  CSW suggested that MOB contact [facility] and apply for the $20 car seat voucher; MOB agreed. MOB also reported having a good support system and  feeling prepared to parent.   MOB acknowledged a hx of anxiety and communicated she as dx around 32 years of age.  MOB is not currently taking any medications and denies having any signs an symptoms. CSW provided education regarding the baby blues period vs. perinatal mood disorders, discussed treatment and gave resources for mental health follow up if concerns arise.  CSW recommends self-evaluation during the postpartum time period using the New Mom Checklist from Postpartum Progress and encouraged MOB to contact a medical professional if symptoms are noted at any time.  MOB presented with insight and awareness and did not demonstrate any acute symptoms during the assessment.  CSW assessed for safety and MOB denied SI, HI, and DV.  CSW inquired about marijuana use. MOB reports the use of marijuana throughout her pregnancy to assist with increasing her appetite and decreasing her nausea.  Per MOB, MOB's last use was 2 weeks ago. MOB denied the use of all other illicit substance during pregnancy however, reported in the past, "I have popped some pills." CSW made MOB aware of hospital's substance use policy and MOB was understanding.  Baby's UDS is negative.  CSW will monitor CDS and make report if warranted. MOB acknowledged CPS hx 4 years ago and reported case was closed after 45 days.   SSI information was presented and MOB is interested in applying.  MOB is familiar with process to due previous premature baby.  MOB plans to apply and CSW provided MOB with necessary documents.   CSW will continue to offer MOB resources and supports while infant remains in NICU.  CSW Plan/Description:  Psychosocial Support and Ongoing Assessment of Needs, Perinatal Mood and Anxiety Disorder (PMADs) Education, Other Patient/Family Education, Theatre stage manager Income (SSI) Information, [place], Other Information/Referral to [employer], CSW Will Continue to Monitor Umbilical Cord  Tissue Drug Screen Results and Make Report if Warranted   Laurey Arrow, MSW, LCSW Clinical Social Work [phone]   Dimple Nanas, LCSW 06/08/2018, 12:03 PM

## 2018-06-12 ENCOUNTER — Ambulatory Visit (INDEPENDENT_AMBULATORY_CARE_PROVIDER_SITE_OTHER): Payer: Medicaid Other | Admitting: *Deleted

## 2018-06-12 ENCOUNTER — Ambulatory Visit (HOSPITAL_COMMUNITY): Payer: Medicaid Other

## 2018-06-12 VITALS — BP 144/98 | HR 81

## 2018-06-12 DIAGNOSIS — Z0130 Encounter for examination of blood pressure without abnormal findings: Secondary | ICD-10-CM

## 2018-06-12 NOTE — Progress Notes (Signed)
Here for blood pressure check. Denies headache or edema. States she took her medicine earlier this am, states she has been running errands today and is going to see baby in NICU.  Shannon Koska,RN

## 2018-06-12 NOTE — Progress Notes (Signed)
Patient seen and assessed by nursing staff.  Agree with documentation and plan.  

## 2018-06-12 NOTE — Progress Notes (Signed)
Reviewed bp's and history with Dr. Shawnie PonsPratt. Instructed pt. per Dr. Shawnie PonsPratt to continue taking her bp meds as ordered and to  keep already scheduled appointment next week- no new orders. Also advised patient to come to mau if she has severe headaches or sudden edema. She voices understanding.  [name],RN

## 2018-06-19 ENCOUNTER — Ambulatory Visit (HOSPITAL_COMMUNITY): Payer: Medicaid Other

## 2018-06-19 ENCOUNTER — Ambulatory Visit: Payer: Medicaid Other

## 2018-07-12 ENCOUNTER — Ambulatory Visit: Payer: Medicaid Other | Admitting: Student

## 2019-04-22 ENCOUNTER — Other Ambulatory Visit: Payer: Self-pay | Admitting: Obstetrics & Gynecology

## 2019-04-22 DIAGNOSIS — O10013 Pre-existing essential hypertension complicating pregnancy, third trimester: Secondary | ICD-10-CM

## 2019-05-21 ENCOUNTER — Other Ambulatory Visit: Payer: Self-pay | Admitting: Obstetrics & Gynecology

## 2019-05-21 DIAGNOSIS — O10013 Pre-existing essential hypertension complicating pregnancy, third trimester: Secondary | ICD-10-CM

## 2019-05-22 ENCOUNTER — Encounter: Payer: Self-pay | Admitting: General Practice

## 2019-07-11 NOTE — L&D Delivery Note (Addendum)
Delivery Note At 1:39 PM a deceased female child was delivered via Vaginal, Spontaneous (Presentation: Right Occiput Anterior). Weight pending.   Placenta status: Spontaneous, Intact, large clot followed behind.  Cord: 3 vessel, with the following complications: None.  Cord pH: unable to obtain  Anesthesia: Epidural Episiotomy: None Lacerations: None Suture Repair: N/A Est. Blood Loss (mL):  total including large clot which was expelled and likely placental abruption TXA and rectal cytotec x1 given.  Mom to [facility] specialty care for cHTN w/ SI PEC w/ SF.  Baby to [facility] when appropriate.  Shirlean Mylar 07/20/2019, 1:57 PM  OB FELLOW DELIVERY ATTESTATION  I was gloved and present for the delivery in its entirety, and I agree with the above resident's note. Patient found to have large placental abruption and IUFD upon presentation. She had not been taking antihypertensives and found to have cHTN with superimposed severe Pre-E. Patient started on Magnesium and Labetalol protocol. She was induced with foley bulb, Pitocin and AROM. Placenta sent to pathology. TXA and Cytotec given during/after delivery due to increased risk of PPH. Magnesium continued for 24h PP and Enalapril 10 mg initiated.  Jerilynn Birkenhead, MD [facility] Family Medicine Fellow, [facility] for [employer], [facility]

## 2019-07-11 NOTE — L&D Delivery Note (Signed)
OB/GYN Faculty Practice Delivery Note  Shannon Gonzales is a 34 y.o. [id number] s/p vaginal delivery at [redacted]w[redacted]d. She was admitted for preterm labor in setting of likely abruption and recent cocaine use.   ROM: 0h 42m with clear fluid GBS Status: unknown (antibiotics ordered but not administered given precipitous delivery) Maximum Maternal Temperature: 98.46F  Labor Progress: Pt presented to MAU reporting bleeding for several days in addition to contractions. On arrival to MAU pt found to be completely dilated on speculum exam and formal ultrasound. NICU consulted and discussed likely fetal outcomes with pt in MAU. Pt was then transferred to L&D at which time pt was noted to have complete cervical dilation. Pt delivered as noted below without complication.  Delivery Date/Time: [phone] on 05/13/20 Delivery: Given breech presentation on formal ultrasound, infant's body was delivered en caul and then AROM for clear fluid was performed prior to delivery of the infant's head. Infant's left arm and head were then delivered via breech maneuvers without difficulty. Loose body nuchal cord present. Given lack of cry and limp appearance, cord was immediately clamped x2 by Dr. Arita Miss. Collection of cord blood attempted with minimal success. Unable to obtain sample for cord gas. Placenta delivered spontaneously, immediately s/p delivery of the infant. Fundus firm with massage and Pitocin. Labia, perineum, vagina, and cervix were inspected without evidence of lacerations.   Placenta: 3-vessel cord, intact, sent to pathology Complications: none Lacerations: none EBL: 50 ml Analgesia: IV fentanyl s/p delivery  Infant: female  APGARs 1 & 1  per medical record  NICU consulted immediately on arrival to MAU and Dr. Algernon Huxley (neonatologist) discussed likely fetal outcomes with pt in MAU. Pt reported desire for all interventions prior to transfer to L&D. Per pt's expressed wishes, NICU team was present at time of delivery and  initiated resuscitation efforts immediately s/p delivery. However, infant was ultimately placed in mother's arms s/p over 15 minutes of resuscitation without benefit. Please see NICU note for additional details.  Lynnda Shields, MD OB/GYN Fellow, Faculty Practice

## 2019-07-20 ENCOUNTER — Encounter (HOSPITAL_COMMUNITY): Payer: Self-pay

## 2019-07-20 ENCOUNTER — Encounter (HOSPITAL_COMMUNITY): Payer: Self-pay | Admitting: Obstetrics and Gynecology

## 2019-07-20 ENCOUNTER — Inpatient Hospital Stay (HOSPITAL_COMMUNITY): Payer: Medicaid Other | Admitting: Anesthesiology

## 2019-07-20 ENCOUNTER — Other Ambulatory Visit: Payer: Self-pay

## 2019-07-20 ENCOUNTER — Inpatient Hospital Stay (HOSPITAL_COMMUNITY): Payer: Medicaid Other

## 2019-07-20 ENCOUNTER — Inpatient Hospital Stay (HOSPITAL_COMMUNITY)
Admission: AD | Admit: 2019-07-20 | Discharge: 2019-07-22 | DRG: 805 | Disposition: A | Discharge status: Home / Self Care | Payer: Medicaid Other | Attending: Obstetrics and Gynecology | Admitting: Obstetrics and Gynecology

## 2019-07-20 DIAGNOSIS — Z3A26 26 weeks gestation of pregnancy: Secondary | ICD-10-CM

## 2019-07-20 DIAGNOSIS — O99324 Drug use complicating childbirth: Secondary | ICD-10-CM | POA: Diagnosis present

## 2019-07-20 DIAGNOSIS — O364XX0 Maternal care for intrauterine death, not applicable or unspecified: Secondary | ICD-10-CM | POA: Diagnosis present

## 2019-07-20 DIAGNOSIS — Z87891 Personal history of nicotine dependence: Secondary | ICD-10-CM | POA: Diagnosis not present

## 2019-07-20 DIAGNOSIS — R748 Abnormal levels of other serum enzymes: Secondary | ICD-10-CM | POA: Diagnosis present

## 2019-07-20 DIAGNOSIS — O4592 Premature separation of placenta, unspecified, second trimester: Secondary | ICD-10-CM | POA: Diagnosis not present

## 2019-07-20 DIAGNOSIS — O0930 Supervision of pregnancy with insufficient antenatal care, unspecified trimester: Secondary | ICD-10-CM

## 2019-07-20 DIAGNOSIS — Z362 Encounter for other antenatal screening follow-up: Secondary | ICD-10-CM

## 2019-07-20 DIAGNOSIS — O0992 Supervision of high risk pregnancy, unspecified, second trimester: Secondary | ICD-10-CM

## 2019-07-20 DIAGNOSIS — O365920 Maternal care for other known or suspected poor fetal growth, second trimester, not applicable or unspecified: Secondary | ICD-10-CM | POA: Diagnosis not present

## 2019-07-20 DIAGNOSIS — O0932 Supervision of pregnancy with insufficient antenatal care, second trimester: Secondary | ICD-10-CM

## 2019-07-20 DIAGNOSIS — O114 Pre-existing hypertension with pre-eclampsia, complicating childbirth: Secondary | ICD-10-CM | POA: Diagnosis present

## 2019-07-20 DIAGNOSIS — O10012 Pre-existing essential hypertension complicating pregnancy, second trimester: Secondary | ICD-10-CM

## 2019-07-20 DIAGNOSIS — O09892 Supervision of other high risk pregnancies, second trimester: Secondary | ICD-10-CM

## 2019-07-20 DIAGNOSIS — O34211 Maternal care for low transverse scar from previous cesarean delivery: Secondary | ICD-10-CM | POA: Diagnosis present

## 2019-07-20 DIAGNOSIS — F1290 Cannabis use, unspecified, uncomplicated: Secondary | ICD-10-CM | POA: Diagnosis present

## 2019-07-20 DIAGNOSIS — O119 Pre-existing hypertension with pre-eclampsia, unspecified trimester: Secondary | ICD-10-CM | POA: Diagnosis present

## 2019-07-20 DIAGNOSIS — O1002 Pre-existing essential hypertension complicating childbirth: Secondary | ICD-10-CM | POA: Diagnosis present

## 2019-07-20 DIAGNOSIS — O4593 Premature separation of placenta, unspecified, third trimester: Secondary | ICD-10-CM | POA: Diagnosis present

## 2019-07-20 DIAGNOSIS — Z98891 History of uterine scar from previous surgery: Secondary | ICD-10-CM

## 2019-07-20 DIAGNOSIS — Z20822 Contact with and (suspected) exposure to covid-19: Secondary | ICD-10-CM | POA: Diagnosis present

## 2019-07-20 DIAGNOSIS — O4590 Premature separation of placenta, unspecified, unspecified trimester: Secondary | ICD-10-CM | POA: Diagnosis present

## 2019-07-20 LAB — CBC
HCT: 33.1 % — ABNORMAL LOW (ref 36.0–46.0)
HCT: 32.1 % — ABNORMAL LOW (ref 36.0–46.0)
Hemoglobin: 11.2 g/dL — ABNORMAL LOW (ref 12.0–15.0)
Hemoglobin: 10.4 g/dL — ABNORMAL LOW (ref 12.0–15.0)
MCH: 31.7 pg (ref 26.0–34.0)
MCH: 30.8 pg (ref 26.0–34.0)
MCHC: 33.8 g/dL (ref 30.0–36.0)
MCHC: 32.4 g/dL (ref 30.0–36.0)
MCV: 93.8 fL (ref 80.0–100.0)
MCV: 95 fL (ref 80.0–100.0)
Platelets: 202 10*3/uL (ref 150–400)
Platelets: 184 10*3/uL (ref 150–400)
RBC: 3.53 MIL/uL — ABNORMAL LOW (ref 3.87–5.11)
RBC: 3.38 MIL/uL — ABNORMAL LOW (ref 3.87–5.11)
RDW: 13.9 % (ref 11.5–15.5)
RDW: 13.8 % (ref 11.5–15.5)
WBC: 18.1 10*3/uL — ABNORMAL HIGH (ref 4.0–10.5)
WBC: 20.3 10*3/uL — ABNORMAL HIGH (ref 4.0–10.5)
nRBC: 0 % (ref 0.0–0.2)
nRBC: 0 % (ref 0.0–0.2)

## 2019-07-20 LAB — RAPID URINE DRUG SCREEN, HOSP PERFORMED
Amphetamines: NOT DETECTED
Barbiturates: NOT DETECTED
Benzodiazepines: NOT DETECTED
Cocaine: NOT DETECTED
Opiates: NOT DETECTED
Tetrahydrocannabinol: POSITIVE — AB

## 2019-07-20 LAB — URINALYSIS, ROUTINE W REFLEX MICROSCOPIC
Bacteria, UA: NONE SEEN
Bilirubin Urine: NEGATIVE
Glucose, UA: NEGATIVE mg/dL
Hgb urine dipstick: NEGATIVE
Ketones, ur: NEGATIVE mg/dL
Leukocytes,Ua: NEGATIVE
Nitrite: NEGATIVE
Protein, ur: 30 mg/dL — AB
Specific Gravity, Urine: 1.021 (ref 1.005–1.030)
pH: 6 (ref 5.0–8.0)

## 2019-07-20 LAB — COMPREHENSIVE METABOLIC PANEL
ALT: 17 U/L (ref 0–44)
ALT: 20 U/L (ref 0–44)
ALT: 18 U/L (ref 0–44)
AST: 50 U/L — ABNORMAL HIGH (ref 15–41)
AST: 57 U/L — ABNORMAL HIGH (ref 15–41)
AST: 45 U/L — ABNORMAL HIGH (ref 15–41)
Albumin: 2.5 g/dL — ABNORMAL LOW (ref 3.5–5.0)
Albumin: 2.7 g/dL — ABNORMAL LOW (ref 3.5–5.0)
Albumin: 2.4 g/dL — ABNORMAL LOW (ref 3.5–5.0)
Alkaline Phosphatase: 67 U/L (ref 38–126)
Alkaline Phosphatase: 79 U/L (ref 38–126)
Alkaline Phosphatase: 70 U/L (ref 38–126)
Anion gap: 8 (ref 5–15)
Anion gap: 7 (ref 5–15)
Anion gap: 8 (ref 5–15)
BUN: <5 mg/dL — ABNORMAL LOW (ref 6–20)
BUN: <5 mg/dL — ABNORMAL LOW (ref 6–20)
BUN: <5 mg/dL — ABNORMAL LOW (ref 6–20)
CO2: 20 mmol/L — ABNORMAL LOW (ref 22–32)
CO2: 24 mmol/L (ref 22–32)
CO2: 22 mmol/L (ref 22–32)
Calcium: 8.9 mg/dL (ref 8.9–10.3)
Calcium: 8.5 mg/dL — ABNORMAL LOW (ref 8.9–10.3)
Calcium: 8 mg/dL — ABNORMAL LOW (ref 8.9–10.3)
Chloride: 106 mmol/L (ref 98–111)
Chloride: 103 mmol/L (ref 98–111)
Chloride: 104 mmol/L (ref 98–111)
Creatinine, Ser: 0.46 mg/dL (ref 0.44–1.00)
Creatinine, Ser: 0.49 mg/dL (ref 0.44–1.00)
Creatinine, Ser: 0.43 mg/dL — ABNORMAL LOW (ref 0.44–1.00)
GFR calc Af Amer: >60 mL/min (ref >60)
GFR calc Af Amer: >60 mL/min (ref >60)
GFR calc Af Amer: >60 mL/min (ref >60)
GFR calc non Af Amer: >60 mL/min (ref >60)
GFR calc non Af Amer: >60 mL/min (ref >60)
GFR calc non Af Amer: >60 mL/min (ref >60)
Glucose, Bld: 81 mg/dL (ref 70–99)
Glucose, Bld: 89 mg/dL (ref 70–99)
Glucose, Bld: 81 mg/dL (ref 70–99)
Potassium: 3.3 mmol/L — ABNORMAL LOW (ref 3.5–5.1)
Potassium: 3.7 mmol/L (ref 3.5–5.1)
Potassium: 3.4 mmol/L — ABNORMAL LOW (ref 3.5–5.1)
Sodium: 134 mmol/L — ABNORMAL LOW (ref 135–145)
Sodium: 134 mmol/L — ABNORMAL LOW (ref 135–145)
Sodium: 134 mmol/L — ABNORMAL LOW (ref 135–145)
Total Bilirubin: 0.4 mg/dL (ref 0.3–1.2)
Total Bilirubin: 0.4 mg/dL (ref 0.3–1.2)
Total Bilirubin: 0.6 mg/dL (ref 0.3–1.2)
Total Protein: 5.6 g/dL — ABNORMAL LOW (ref 6.5–8.1)
Total Protein: 6.1 g/dL — ABNORMAL LOW (ref 6.5–8.1)
Total Protein: 5.5 g/dL — ABNORMAL LOW (ref 6.5–8.1)

## 2019-07-20 LAB — CBC WITH DIFFERENTIAL/PLATELET
Abs Immature Granulocytes: 0.07 10*3/uL (ref 0.00–0.07)
Basophils Absolute: 0 10*3/uL (ref 0.0–0.1)
Basophils Relative: 0 %
Eosinophils Absolute: 0.3 10*3/uL (ref 0.0–0.5)
Eosinophils Relative: 2 %
HCT: 30.9 % — ABNORMAL LOW (ref 36.0–46.0)
Hemoglobin: 10.5 g/dL — ABNORMAL LOW (ref 12.0–15.0)
Immature Granulocytes: 1 %
Lymphocytes Relative: 16 %
Lymphs Abs: 2.3 10*3/uL (ref 0.7–4.0)
MCH: 31.6 pg (ref 26.0–34.0)
MCHC: 34 g/dL (ref 30.0–36.0)
MCV: 93.1 fL (ref 80.0–100.0)
Monocytes Absolute: 0.9 10*3/uL (ref 0.1–1.0)
Monocytes Relative: 6 %
Neutro Abs: 11.3 10*3/uL — ABNORMAL HIGH (ref 1.7–7.7)
Neutrophils Relative %: 75 %
Platelets: 231 10*3/uL (ref 150–400)
RBC: 3.32 MIL/uL — ABNORMAL LOW (ref 3.87–5.11)
RDW: 13.8 % (ref 11.5–15.5)
WBC: 15 10*3/uL — ABNORMAL HIGH (ref 4.0–10.5)
nRBC: 0 % (ref 0.0–0.2)

## 2019-07-20 LAB — TYPE AND SCREEN
ABO/RH(D): B POS
Antibody Screen: NEGATIVE

## 2019-07-20 LAB — PROTIME-INR
INR: 1 (ref 0.8–1.2)
INR: 1 (ref 0.8–1.2)
INR: 1 (ref 0.8–1.2)
Prothrombin Time: 13.1 seconds (ref 11.4–15.2)
Prothrombin Time: 13.5 seconds (ref 11.4–15.2)
Prothrombin Time: 13.1 seconds (ref 11.4–15.2)

## 2019-07-20 LAB — HEPATITIS B SURFACE ANTIGEN: Hepatitis B Surface Ag: NONREACTIVE

## 2019-07-20 LAB — FIBRINOGEN
Fibrinogen: 450 mg/dL (ref 210–475)
Fibrinogen: 411 mg/dL (ref 210–475)
Fibrinogen: 420 mg/dL (ref 210–475)

## 2019-07-20 LAB — APTT
aPTT: 23 seconds — ABNORMAL LOW (ref 24–36)
aPTT: 26 seconds (ref 24–36)
aPTT: 26 seconds (ref 24–36)

## 2019-07-20 LAB — TSH: TSH: 0.526 u[IU]/mL (ref 0.350–4.500)

## 2019-07-20 LAB — RPR: RPR Ser Ql: NONREACTIVE

## 2019-07-20 LAB — ABO/RH: ABO/RH(D): B POS

## 2019-07-20 LAB — PROTEIN / CREATININE RATIO, URINE
Creatinine, Urine: 180.84 mg/dL
Protein Creatinine Ratio: 0.34 mg/mg{Cre} — ABNORMAL HIGH (ref 0.00–0.15)
Total Protein, Urine: 61 mg/dL

## 2019-07-20 LAB — SARS CORONAVIRUS 2 (TAT 6-24 HRS): SARS Coronavirus 2: NEGATIVE

## 2019-07-20 LAB — HIV ANTIBODY (ROUTINE TESTING W REFLEX): HIV Screen 4th Generation wRfx: NONREACTIVE

## 2019-07-20 MED ORDER — ONDANSETRON HCL 4 MG/2ML IJ SOLN
4.0000 mg | INTRAMUSCULAR | Status: DC | PRN
  Administered 2019-07-20 – 2019-07-21 (×2): 4 mg via INTRAVENOUS
  Filled 2019-07-20 (×3): qty 2

## 2019-07-20 MED ORDER — LIDOCAINE HCL (PF) 1 % IJ SOLN
30.0000 mL | INTRAMUSCULAR | Status: AC | PRN
  Administered 2019-07-20: 11 mL via SUBCUTANEOUS

## 2019-07-20 MED ORDER — LABETALOL HCL 5 MG/ML IV SOLN: 20.0000 mg | INTRAVENOUS | Status: DC | PRN

## 2019-07-20 MED ORDER — HYDROMORPHONE HCL 1 MG/ML IJ SOLN
1.0000 mg | Freq: Once | INTRAMUSCULAR | Status: AC
  Administered 2019-07-20: 1 mg via INTRAVENOUS
  Filled 2019-07-20: qty 1

## 2019-07-20 MED ORDER — TETANUS-DIPHTH-ACELL PERTUSSIS 5-2.5-18.5 LF-MCG/0.5 IM SUSP: 0.5000 mL | Freq: Once | INTRAMUSCULAR | Status: DC

## 2019-07-20 MED ORDER — SIMETHICONE 80 MG PO CHEW: 80.0000 mg | CHEWABLE_TABLET | ORAL | Status: DC | PRN

## 2019-07-20 MED ORDER — LABETALOL HCL 5 MG/ML IV SOLN: 80.0000 mg | INTRAVENOUS | Status: DC | PRN

## 2019-07-20 MED ORDER — DIPHENHYDRAMINE HCL 25 MG PO CAPS: 25.0000 mg | ORAL_CAPSULE | Freq: Four times a day (QID) | ORAL | Status: DC | PRN

## 2019-07-20 MED ORDER — MAGNESIUM SULFATE BOLUS VIA INFUSION
4.0000 g | Freq: Once | INTRAVENOUS | Status: AC
  Administered 2019-07-20: 4 g via INTRAVENOUS
  Filled 2019-07-20: qty 1000

## 2019-07-20 MED ORDER — EPHEDRINE 5 MG/ML INJ: 10.0000 mg | INTRAVENOUS | Status: DC | PRN

## 2019-07-20 MED ORDER — IBUPROFEN 600 MG PO TABS
600.0000 mg | ORAL_TABLET | Freq: Three times a day (TID) | ORAL | Status: DC | PRN
  Administered 2019-07-21: 600 mg via ORAL
  Filled 2019-07-20: qty 1

## 2019-07-20 MED ORDER — COCONUT OIL OIL: 1.0000 "application " | TOPICAL_OIL | Status: DC | PRN

## 2019-07-20 MED ORDER — FENTANYL-BUPIVACAINE-NACL 0.5-0.125-0.9 MG/250ML-% EP SOLN: 12.0000 mL/h | EPIDURAL | Status: DC | PRN

## 2019-07-20 MED ORDER — MEASLES, MUMPS & RUBELLA VAC IJ SOLR: 0.5000 mL | Freq: Once | INTRAMUSCULAR | Status: DC

## 2019-07-20 MED ORDER — HYDRALAZINE HCL 20 MG/ML IJ SOLN: 10.0000 mg | INTRAMUSCULAR | Status: DC | PRN

## 2019-07-20 MED ORDER — MAGNESIUM SULFATE 40 GM/1000ML IV SOLN: 1.0000 g/h | INTRAVENOUS | Status: DC

## 2019-07-20 MED ORDER — OXYCODONE-ACETAMINOPHEN 5-325 MG PO TABS
1.0000 | ORAL_TABLET | ORAL | Status: DC | PRN
  Administered 2019-07-21 (×2): 2 via ORAL
  Filled 2019-07-20 (×2): qty 2

## 2019-07-20 MED ORDER — LABETALOL HCL 5 MG/ML IV SOLN
80.0000 mg | INTRAVENOUS | Status: DC | PRN
  Filled 2019-07-20: qty 16

## 2019-07-20 MED ORDER — TRANEXAMIC ACID-NACL 1000-0.7 MG/100ML-% IV SOLN
1000.0000 mg | INTRAVENOUS | Status: AC
  Administered 2019-07-20: 1000 mg via INTRAVENOUS
  Filled 2019-07-20: qty 100

## 2019-07-20 MED ORDER — SENNOSIDES-DOCUSATE SODIUM 8.6-50 MG PO TABS
2.0000 | ORAL_TABLET | ORAL | Status: DC
  Administered 2019-07-21: 2 via ORAL
  Filled 2019-07-20: qty 2

## 2019-07-20 MED ORDER — ACETAMINOPHEN 325 MG PO TABS
650.0000 mg | ORAL_TABLET | Freq: Four times a day (QID) | ORAL | Status: DC | PRN
  Filled 2019-07-20: qty 2

## 2019-07-20 MED ORDER — LABETALOL HCL 200 MG PO TABS: 200.0000 mg | ORAL_TABLET | Freq: Two times a day (BID) | ORAL | Status: DC

## 2019-07-20 MED ORDER — PRENATAL MULTIVITAMIN CH: 1.0000 | ORAL_TABLET | Freq: Every day | ORAL | Status: DC

## 2019-07-20 MED ORDER — ACETAMINOPHEN 325 MG PO TABS: 650.0000 mg | ORAL_TABLET | ORAL | Status: DC | PRN

## 2019-07-20 MED ORDER — BENZOCAINE-MENTHOL 20-0.5 % EX AERO: 1.0000 "application " | INHALATION_SPRAY | CUTANEOUS | Status: DC | PRN

## 2019-07-20 MED ORDER — OXYTOCIN BOLUS FROM INFUSION
500.0000 mL | Freq: Once | INTRAVENOUS | Status: AC
  Administered 2019-07-20: 500 mL via INTRAVENOUS

## 2019-07-20 MED ORDER — LABETALOL HCL 5 MG/ML IV SOLN
40.0000 mg | INTRAVENOUS | Status: DC | PRN
  Administered 2019-07-20: 40 mg via INTRAVENOUS
  Filled 2019-07-20 (×2): qty 8

## 2019-07-20 MED ORDER — MEDROXYPROGESTERONE ACETATE 150 MG/ML IM SUSP
150.0000 mg | Freq: Once | INTRAMUSCULAR | Status: AC
  Administered 2019-07-21: 150 mg via INTRAMUSCULAR
  Filled 2019-07-20: qty 1

## 2019-07-20 MED ORDER — FENTANYL CITRATE (PF) 100 MCG/2ML IJ SOLN
50.0000 ug | INTRAMUSCULAR | Status: DC | PRN
  Administered 2019-07-20 (×2): 100 ug via INTRAVENOUS
  Filled 2019-07-20 (×2): qty 2

## 2019-07-20 MED ORDER — FENTANYL-BUPIVACAINE-NACL 0.5-0.125-0.9 MG/250ML-% EP SOLN
EPIDURAL | Status: AC
  Filled 2019-07-20: qty 250

## 2019-07-20 MED ORDER — ONDANSETRON HCL 4 MG PO TABS: 4.0000 mg | ORAL_TABLET | ORAL | Status: DC | PRN

## 2019-07-20 MED ORDER — SODIUM CHLORIDE 0.9 % IV BOLUS: 500.0000 mL | Freq: Once | INTRAVENOUS | Status: DC

## 2019-07-20 MED ORDER — PHENYLEPHRINE 40 MCG/ML (10ML) SYRINGE FOR IV PUSH (FOR BLOOD PRESSURE SUPPORT)
80.0000 ug | PREFILLED_SYRINGE | INTRAVENOUS | Status: DC | PRN
  Administered 2019-07-20 (×2): 80 ug via INTRAVENOUS

## 2019-07-20 MED ORDER — LACTATED RINGERS IV SOLN: INTRAVENOUS | Status: DC

## 2019-07-20 MED ORDER — OXYTOCIN 40 UNITS IN NORMAL SALINE INFUSION - SIMPLE MED
1.0000 m[IU]/min | INTRAVENOUS | Status: DC
  Administered 2019-07-20: 2 m[IU]/min via INTRAVENOUS

## 2019-07-20 MED ORDER — PHENYLEPHRINE 40 MCG/ML (10ML) SYRINGE FOR IV PUSH (FOR BLOOD PRESSURE SUPPORT)
80.0000 ug | PREFILLED_SYRINGE | INTRAVENOUS | Status: DC | PRN
  Filled 2019-07-20: qty 10

## 2019-07-20 MED ORDER — ONDANSETRON HCL 4 MG/2ML IJ SOLN
4.0000 mg | Freq: Four times a day (QID) | INTRAMUSCULAR | Status: DC | PRN
  Administered 2019-07-20: 4 mg via INTRAVENOUS
  Filled 2019-07-20: qty 2

## 2019-07-20 MED ORDER — LABETALOL HCL 5 MG/ML IV SOLN: 40.0000 mg | INTRAVENOUS | Status: DC | PRN

## 2019-07-20 MED ORDER — MAGNESIUM SULFATE BOLUS VIA INFUSION
4.0000 g | Freq: Once | INTRAVENOUS | Status: DC
  Filled 2019-07-20: qty 1000

## 2019-07-20 MED ORDER — OXYTOCIN 40 UNITS IN NORMAL SALINE INFUSION - SIMPLE MED
2.5000 [IU]/h | INTRAVENOUS | Status: DC
  Filled 2019-07-20: qty 1000

## 2019-07-20 MED ORDER — MAGNESIUM SULFATE 40 GM/1000ML IV SOLN
2.0000 g/h | INTRAVENOUS | Status: AC
  Administered 2019-07-20: 1 g/h via INTRAVENOUS
  Administered 2019-07-21: 2 g/h via INTRAVENOUS
  Filled 2019-07-20: qty 1000

## 2019-07-20 MED ORDER — SODIUM CHLORIDE (PF) 0.9 % IJ SOLN
INTRAMUSCULAR | Status: DC | PRN
  Administered 2019-07-20: 12 mL/h via EPIDURAL

## 2019-07-20 MED ORDER — MORPHINE SULFATE (PF) 4 MG/ML IV SOLN
4.0000 mg | Freq: Once | INTRAVENOUS | Status: AC
  Administered 2019-07-20: 4 mg via INTRAVENOUS
  Filled 2019-07-20: qty 1

## 2019-07-20 MED ORDER — HYDRALAZINE HCL 10 MG PO TABS
10.0000 mg | ORAL_TABLET | Freq: Once | ORAL | Status: DC
  Filled 2019-07-20: qty 1

## 2019-07-20 MED ORDER — DIBUCAINE (PERIANAL) 1 % EX OINT: 1.0000 "application " | TOPICAL_OINTMENT | CUTANEOUS | Status: DC | PRN

## 2019-07-20 MED ORDER — LABETALOL HCL 5 MG/ML IV SOLN
20.0000 mg | INTRAVENOUS | Status: DC | PRN
  Administered 2019-07-20: 20 mg via INTRAVENOUS
  Filled 2019-07-20: qty 4

## 2019-07-20 MED ORDER — OXYCODONE-ACETAMINOPHEN 5-325 MG PO TABS
1.0000 | ORAL_TABLET | Freq: Four times a day (QID) | ORAL | Status: DC | PRN
  Administered 2019-07-20: 1 via ORAL
  Filled 2019-07-20: qty 1

## 2019-07-20 MED ORDER — MAGNESIUM SULFATE 40 GM/1000ML IV SOLN
1.0000 g/h | INTRAVENOUS | Status: DC
  Filled 2019-07-20: qty 1000

## 2019-07-20 MED ORDER — LACTATED RINGERS IV SOLN: 500.0000 mL | Freq: Once | INTRAVENOUS | Status: DC

## 2019-07-20 MED ORDER — LACTATED RINGERS IV SOLN
500.0000 mL | INTRAVENOUS | Status: DC | PRN
  Administered 2019-07-20 (×2): 500 mL via INTRAVENOUS
  Administered 2019-07-20: 300 mL via INTRAVENOUS

## 2019-07-20 MED ORDER — LACTATED RINGERS IV SOLN: INTRAVENOUS | Status: AC

## 2019-07-20 MED ORDER — SOD CITRATE-CITRIC ACID 500-334 MG/5ML PO SOLN: 30.0000 mL | ORAL | Status: DC | PRN

## 2019-07-20 MED ORDER — DIPHENHYDRAMINE HCL 50 MG/ML IJ SOLN: 12.5000 mg | INTRAMUSCULAR | Status: DC | PRN

## 2019-07-20 MED ORDER — ENALAPRIL MALEATE 5 MG PO TABS
10.0000 mg | ORAL_TABLET | Freq: Every day | ORAL | Status: DC
  Administered 2019-07-20 – 2019-07-22 (×3): 10 mg via ORAL
  Filled 2019-07-20 (×3): qty 2

## 2019-07-20 MED ORDER — MISOPROSTOL 200 MCG PO TABS
800.0000 ug | ORAL_TABLET | Freq: Once | ORAL | Status: AC
  Administered 2019-07-20: 800 ug via RECTAL
  Filled 2019-07-20: qty 4

## 2019-07-20 MED ORDER — WITCH HAZEL-GLYCERIN EX PADS: 1.0000 "application " | MEDICATED_PAD | CUTANEOUS | Status: DC | PRN

## 2019-07-20 NOTE — Anesthesia Procedure Notes (Signed)
Epidural Patient location during procedure: OB Start time: 07/20/2019 9:58 AM End time: 07/20/2019 10:10 AM  Staffing Anesthesiologist: Lowella Curb, MD Performed: anesthesiologist   Preanesthetic Checklist Completed: patient identified, IV checked, site marked, risks and benefits discussed, surgical consent, monitors and equipment checked, pre-op evaluation and timeout performed  Epidural Patient position: sitting Prep: ChloraPrep Patient monitoring: heart rate, cardiac monitor, continuous pulse ox and blood pressure Approach: midline Location: L2-L3 Injection technique: LOR saline  Needle:  Needle type: Tuohy  Needle gauge: 17 G Needle length: 9 cm Needle insertion depth: 5 cm Catheter type: closed end flexible Catheter size: 20 Guage Catheter at skin depth: 9 cm Test dose: negative  Assessment Events: blood not aspirated, injection not painful, no injection resistance, no paresthesia and negative IV test  Additional Notes Reason for block:procedure for pain

## 2019-07-20 NOTE — Progress Notes (Signed)
Labor Progress Note Shannon Gonzales is a 34 y.o. [phone] at 106w3d presented for IUFD, cHTN w/ SI PEC w/ SF S: Just received epidural  O:  BP 106/74   Pulse 65   Temp 98.8 F (37.1 C) (Oral)   Resp 16   Ht 5' (1.524 m)   Wt 71.7 kg   SpO2 96%   BMI 30.86 kg/m  EFM: IUFD  CVE: Dilation: 2.5 Effacement (%): 40 Station: -2 Presentation: Vertex Exam by:: Eckstat md   A&P: 34 y.o. [id number] [redacted]w[redacted]d here for IUFD and cHTN w/ SI PEC w/ SF.  #IUFD: likely secondary to large placental abruption seen on [place], with cHTN with superimposed PreE contributing, UDS only positive for MJ and denied other drug use to MAU provider. Dating by sure LMP prior to pregnancy. Would like to hold infant after delivery. Due to abruption, concern for development of DIC and PP hemorrhage risk high. Initial labs reassuring: H/H 10.5/30.9, fibrinogen WNL at 450, PT 13/INR 1 both WNL, aPTT 23 decreased, TSH WNL. TXA ordered if needed.  #cHTN w superimposed PreE: U P:C 0.34, has had multiple severe range BP's since arrival and received IV labetalol x1, however on review of records patient often was in this range in the past so not clear if it is a new elevation or her baseline. Creatinine around bl of 0.46, AST/ALT WNL at 50/17 respectively. - Mg gtt - increase home labetalol to 200mg  BID, will likely need uptitration  #TOLAC: prior successful VBAC x4, cephalic on and on SVE.   #Labor: s/p FB (1025), start low dose pitocin #Pain: Epidural in place #GBS/ID: unknown #COVID: swab pending #MOC: depo-provera  [place], MD 10:29 AM

## 2019-07-20 NOTE — MAU Note (Signed)
Pt reports to MAU via EMS, with complaints of contractions and possibly lost mucus plug, pt has had five live births the last one being at 28 weeks one year prior. She has had no prental care but reports preterm labor with multiple pregnancies, and a history of high B/P. Denies ROM and VB, had not felt baby move for the last two hours.

## 2019-07-20 NOTE — Progress Notes (Signed)
Labor Progress Note Shannon Gonzales is a 34 y.o. [phone] at [redacted]w[redacted]d presented for IUFD, cHTN w/ SI PEC w/ SF S: Comfortable, epidural working  O:  BP (!) 130/91 (BP Location: Right Arm)   Pulse 68   Temp 97.6 F (36.4 C) (Axillary)   Resp 16   Ht 5' (1.524 m)   Wt 71.7 kg   SpO2 97%   BMI 30.86 kg/m  EFM: IUFD  CVE: Dilation: 6 Effacement (%): 90 Station: -1 Presentation: Vertex Exam by:: MD Nathin Saran   A&P: 34 y.o. [id number] [redacted]w[redacted]d here for IUFD and cHTN w/ SI PEC w/ SF.  #IUFD: likely secondary to large placental abruption seen on [place], with cHTN with superimposed PreE contributing, UDS only positive for MJ and denied other drug use to MAU provider. Dating by sure LMP prior to pregnancy. Would like to hold infant after delivery. Due to abruption, concern for development of DIC and PP hemorrhage risk high. Initial labs reassuring: H/H 10.5/30.9, fibrinogen WNL at 450, PT 13/INR 1 both WNL, aPTT 23 decreased, TSH WNL. TXA ordered. 250 cc bolus ordered d/t difficulty drawing labs. If 2nd set of labs are reassuring, will space labs to q8h.  #cHTN w superimposed PreE: U P:C 0.34, has had multiple severe range BP's since arrival and received IV labetalol x2, however on review of records patient often was in this range in the past so not clear if it is a new elevation or her baseline. Creatinine around bl of 0.46, AST/ALT WNL at 50/17 respectively. - Mg gtt - increase home labetalol to 200mg  BID, will likely need uptitration  #TOLAC: prior successful VBAC x4, cephalic on and on SVE.   #Labor: s/p FB (1025), pit to 8, AROM for clear at 1315 #Pain: Epidural in place #GBS/ID: unknown #COVID: swab pending #MOC: depo-provera  [place], MD 1:25 PM

## 2019-07-20 NOTE — MAU Provider Note (Signed)
History     CSN: [id number]  Arrival date and time: 07/20/19 8841   First Provider Initiated Contact with Patient 07/20/19 0419      Chief Complaint  Patient presents with  . Contractions   Nurse call and reports patient arrive by EMS for contractions and unable to doppler FHT.  Patient with history of PTL and fetal demise x 1. At bedside and patient endorses history as reported.  States she started having contractions around 0130am that have gotten progressively stronger. She denies LOF and vaginal discharge, but reports "I lost some of my mucous plug."  She also denies vaginal bleeding.    She reports that she confirmed her pregnancy test at the HD to confirm pregnancy and went by her definite LMP which she has been keeping track on her phone. She reports taking Labetalol 100mg  BID and her last dose was yesterday morning. She reports she occasionally takes PNV and some OTC medications including laxative and stool softener.  Patient reports MJ usage throughout the pregnancy and states that her last usage was few hours. Patient endorses sexual activity within the past 3 days and denies GI and GU concerns.  However, during interview patient states she feels new onset RUQ pain that is sharp and present with breathing.      OB History    Gravida  9   Para  6   Term  1   Preterm  5   AB  2   Living  5     SAB  1   TAB  1   Ectopic      Multiple  0   Live Births  2           Past Medical History:  Diagnosis Date  . Anxiety   . Depression   . Drug abuse, marijuana    with pregnancy  . Hx of PTL (preterm labor), current pregnancy   . Hypertension   . Late prenatal care   . Trichimoniasis    with pregnancy    Past Surgical History:  Procedure Laterality Date  . CESAREAN SECTION      Family History  Problem Relation Age of Onset  . Hypertension Mother     Social History   Tobacco Use  . Smoking status: Former Smoker    Packs/day: 0.10    Types:  Cigarettes  . Smokeless tobacco: Never Used  Substance Use Topics  . Alcohol use: No  . Drug use: Yes    Types: Hashish, Marijuana    Allergies: No Known Allergies  Medications Prior to Admission  Medication Sig Dispense Refill Last Dose  . labetalol (NORMODYNE) 200 MG tablet Take 200 mg by mouth 2 (two) times daily.   07/19/2019 at 1000am  . Prenat-FePro-Methf-Ca-Omega 3 (PRENATAL + COMPLETE MULTI) 18-0.8 & 290 MG THPK Take 1 tablet by mouth daily. 60 each 6 Past Month at Unknown time  . amLODipine (NORVASC) 10 MG tablet Take 1 tablet (10 mg total) by mouth daily. 30 tablet 2   . enalapril (VASOTEC) 20 MG tablet Take 1 tablet (20 mg total) by mouth daily. 30 tablet 2   . ibuprofen (ADVIL,MOTRIN) 600 MG tablet Take 1 tablet (600 mg total) by mouth every 6 (six) hours. 30 tablet 0   . oxyCODONE-acetaminophen (PERCOCET/ROXICET) 5-325 MG tablet Take 1 tablet by mouth every 6 (six) hours as needed (headache). (Patient not taking: Reported on 06/12/2018) 8 tablet 0     Review of Systems  Constitutional: Negative  for chills and fever.  Eyes: Negative for visual disturbance.  Respiratory: Negative for cough and shortness of breath.   Gastrointestinal: Positive for abdominal pain, constipation, nausea and vomiting. Negative for diarrhea.  Genitourinary: Positive for pelvic pain. Negative for difficulty urinating, dyspareunia, dysuria, vaginal bleeding and vaginal discharge.  Neurological: Negative for dizziness, light-headedness and headaches.   Physical Exam   Blood pressure (!) 163/110, pulse 70, temperature 98.2 F (36.8 C), resp. rate 18, unknown if currently breastfeeding.   Vitals:   07/20/19 0404 07/20/19 0413 07/20/19 0429 07/20/19 0431  BP: (!) 163/110  (!) 149/114 (!) 156/111  Pulse: 70  92 92  Resp:  18    Temp:  98.2 F (36.8 C)    SpO2:    99%     Physical Exam  Constitutional: She is oriented to person, place, and time. She appears well-developed and well-nourished.  She appears distressed (With contractions).  HENT:  Head: Normocephalic and atraumatic.  Eyes: Conjunctivae are normal.  Cardiovascular: Normal rate.  Respiratory: Effort normal.  GI: Soft. There is no abdominal tenderness.  Gravid   Musculoskeletal:        General: No edema. Normal range of motion.     Cervical back: Normal range of motion.  Neurological: She is alert and oriented to person, place, and time.  Skin: Skin is warm and dry.  Psychiatric: She has a normal mood and affect. Her behavior is normal.    MAU Course  Procedures Results for orders placed or performed during the hospital encounter of 07/20/19 (from the past 24 hour(s))  Urinalysis, Routine w reflex microscopic     Status: Abnormal   Collection Time: 07/20/19  4:23 AM  Result Value Ref Range   Color, Urine YELLOW YELLOW   APPearance CLEAR CLEAR   Specific Gravity, Urine 1.021 1.005 - 1.030   pH 6.0 5.0 - 8.0   Glucose, UA NEGATIVE NEGATIVE mg/dL   Hgb urine dipstick NEGATIVE NEGATIVE   Bilirubin Urine NEGATIVE NEGATIVE   Ketones, ur NEGATIVE NEGATIVE mg/dL   Protein, ur 30 (A) NEGATIVE mg/dL   Nitrite NEGATIVE NEGATIVE   Leukocytes,Ua NEGATIVE NEGATIVE   WBC, UA 0-5 0 - 5 WBC/hpf   Bacteria, UA NONE SEEN NONE SEEN   Squamous Epithelial / LPF 0-5 0 - 5   Mucus PRESENT    MDM BSUS IV start Antihypertensives Pain Medication Labs: UA, UDS, OB Panel, PIH WU Assessment and Plan  34 year old, [id number]  IUFD at 26.3 weeks CHTN-Uncontrolled  -Reviewed POC with patient. -Discussed need for IV placement and antihypertensives. -Labetalol IV ordered f/b Hydralazine (tablet d/t national IV med shortage). -Will await for [place] to confirm IUFD before proceeding with other orders. -Patient reports contraction pain and requests medication. -Will order morphine 4mg  IV.  Maryann Conners 07/20/2019, 4:21 AM   Reassessment (4:45 AM) -[place] confirms IUFD by large marginal abruption.  -LD team contacted and informed of  patient status and need for admission. -Standard admission orders placed as well as OB panel.  -PreEclampsia focuses labs placed. -In room to inform patient of [place] results and need for admission. -Patient questions what could cause symptoms and reports previous IUFD was d/t an abruption.  -Reviewed patient's personal risk factors for abruption including drug usage, uncontrolled hypertension, and history of abruption. -Patient without further questions or concerns. -FOB at bedside. -Condolences given to patient and FOB. -Admit to [place] MSN, CNM Advanced Practice Provider, Center for [facility]

## 2019-07-20 NOTE — Discharge Summary (Addendum)
  Postpartum Discharge Summary     Patient Name: Shannon Gonzales DOB: 10/23/1985 MRN: [id number]  Date of admission: 07/20/2019 Delivering Provider: FAIR, CHELSEA N   Date of discharge: 07/22/2019  Admitting diagnosis: Pregnancy at 26/3 (based on LMP). Preterm labor. IUFD. cHTN with superimposed severe pre-eclampsia vs atypical HELLP (BPs, LFTs), placental abruption via ultrasound Secondary diagnosis:  H/o VBAC, +THC      Discharge diagnosis: Delivered.                                                                                             Post partum procedures:24 hours PP Mg  Augmentation: AROM, Pitocin and Foley Balloon  Complications: likely DIC  Hospital course:  Induction of Labor With Vaginal Delivery   33 y.o. yo G9P1435 at [redacted]w[redacted]d was admitted to the hospital 07/20/2019 for induction of labor.  Indication for induction: cHTN with superimposed severe Pre-E; IUFD.  Patient found to have large placental abruption and IUFD upon presentation. She had not been taking antihypertensives and found to have cHTN with superimposed severe Pre-E. Patient started on Magnesium and Labetalol protocol. She was induced with foley bulb, Pitocin and AROM.  Membrane Rupture Time/Date: 1:13 PM ,07/20/2019   Intrapartum Procedures: Episiotomy: None [1]                                         Lacerations:  None [1]  Patient had delivery of a Non Viable infant.  Information for the patient's newborn:  Dimattia, PendingBaby FD [[id number]]  Delivery Method: VBAC, Spontaneous(Filed from Delivery Summary)  Delivery time: 1:39 PM   Details of delivery can be found in separate delivery note. Magnesium continued for 24 hours post-partum. Patient placed on Enalapril 10 mg daily. Labs trended post-partum with down-trending LFT's and stable platelets. She opted for Depo for birth control which was given prior to DC. Patient is discharged home 07/22/19.    Magnesium Sulfate received: Yes BMZ received:  No Rhophylac:No MMR:No Transfusion:No  Physical exam  Vitals:   07/21/19 2225 07/22/19 0513 07/22/19 0823 07/22/19 0824  BP:  (!) 147/92 (!) 152/111 (!) 151/97  Pulse: 84 66 88 78  Resp: 18 18  18  Temp: 98.4 F (36.9 C) 98.3 F (36.8 C)  98.5 F (36.9 C)  TempSrc: Oral Oral  Oral  SpO2: 100% 100%  100%  Weight:      Height:       General: alert Lochia: appropriate Uterine Fundus: soft, nttp, nd Incision: N/A DVT Evaluation: No evidence of DVT seen on physical exam. Labs: CBC Latest Ref Rng & Units 07/21/2019 07/20/2019 07/20/2019  WBC 4.0 - 10.5 K/uL 17.6(H) 20.3(H) 18.1(H)  Hemoglobin 12.0 - 15.0 g/dL 9.8(L) 10.4(L) 11.2(L)  Hematocrit 36.0 - 46.0 % 29.4(L) 32.1(L) 33.1(L)  Platelets 150 - 400 K/uL 163 184 202   CMP Latest Ref Rng & Units 07/21/2019 07/20/2019 07/20/2019  Glucose 70 - 99 mg/dL 81 81 89  BUN 6 - 20 mg/dL <5(L) <5(L) <5(L)  Creatinine 0.44 - 1.00 mg/dL   0.46 0.43(L) 0.49  Sodium 135 - 145 mmol/L 136 134(L) 134(L)  Potassium 3.5 - 5.1 mmol/L 3.9 3.4(L) 3.7  Chloride 98 - 111 mmol/L 107 104 103  CO2 22 - 32 mmol/L _0 Calcium 8.9 - 10.3 mg/dL 8.0(L) 8.0(L) 8.5(L)  Total Protein 6.5 - 8.1 g/dL 5.1(L) 5.5(L) 6.1(L)  Total Bilirubin 0.3 - 1.2 mg/dL <0.1(L) 0.6 0.4  Alkaline Phos 38 - 126 U/L 72 70 79  AST 15 - 41 U/L 45(H) 45(H) 57(H)  ALT 0 - 44 U/L _1 Discharge instruction: per After Visit Summary and "Baby and Me Booklet".  After visit meds:  Allergies as of 07/22/2019   No Known Allergies     Medication List    STOP taking these medications   amLODipine 10 MG tablet Commonly known as: NORVASC   labetalol 200 MG tablet Commonly known as: NORMODYNE   oxyCODONE-acetaminophen 5-325 MG tablet Commonly known as: PERCOCET/ROXICET   Prenatal + Complete Multi 18-0.8 & 290 MG Thpk     TAKE these medications   calcium carbonate 500 MG chewable tablet Commonly known as: TUMS - dosed in mg elemental calcium Chew 4 tablets by mouth as  needed for indigestion or heartburn.   docusate sodium 100 MG capsule Commonly known as: COLACE Take 1 capsule (100 mg total) by mouth 2 (two) times daily as needed for mild constipation.   enalapril 10 MG tablet Commonly known as: VASOTEC Take 1 tablet (10 mg total) by mouth daily. What changed:   medication strength  how much to take   ferrous gluconate 324 MG tablet Commonly known as: FERGON Take 1 tablet (324 mg total) by mouth daily with breakfast. Start taking on: July 23, 2019   hydrOXYzine 25 MG tablet Commonly known as: ATARAX/VISTARIL Take 1 tablet (25 mg total) by mouth every 6 (six) hours as needed for anxiety (sleep).   ibuprofen 600 MG tablet Commonly known as: ADVIL Take 1 tablet (600 mg total) by mouth every 6 (six) hours as needed. What changed:   when to take this  reasons to take this       Diet: routine diet  Activity: Advance as tolerated. Pelvic rest for 6 weeks.   Outpatient follow up:4 weeks Follow up Appt: Future Appointments  Date Time Provider [place]  07/28/2019  2:20 PM [place]  07/29/2019  8:15 AM [place]  08/19/2019  1:55 PM Jorje Guild, NP [facility]   Follow up Visit: [place] for [facility]. Go in 6 day(s).   Specialty: Obstetrics and Gynecology Contact information: [address] [place], [place] [phone] [place] [id number] [phone]           Please schedule this patient for Postpartum visit in: 4 weeks with the following provider: MD High risk pregnancy complicated by: HTN Delivery mode:  SVD Anticipated Birth Control:  Depo PP Procedures needed: BP check  Schedule Integrated [place] visit: yes    Newborn Data: IUFD Birth Weight: 1145gm  Newborn Delivery   Birth date/time: 07/20/2019 13:39:00 Delivery type: Vaginal, Spontaneous      Baby Feeding:  NA Disposition:morgue   07/22/2019 Aletha Halim, MD

## 2019-07-20 NOTE — H&P (Addendum)
LABOR AND DELIVERY ADMISSION HISTORY AND PHYSICAL NOTE  Shannon Gonzales is a 34 y.o. female [phone] with IUP at [redacted]w[redacted]d by LMP presenting for IUFD.   Presented to MAU today via EMS for report of contractions. On arrival to MAU unable to doppler fetal heart tones, both bedside and formal US showed no cardiac activity and presence of large placental abruption.  Patient denies LOF or VB but endorses losing mucous plug, last felt baby move around 2hrs prior to arrival.  Has a hx of cHTN and takes labetalol 100mg  BID.  Denies headache, vision changes, chest pain, LE edema Does endorse epigastric/RUQ pain that started tonight, somewhat improved after receiving IV labetalol in MAU   Prenatal History/Complications: Confirmed pregnancy at [facility], but no prenatal care since  [facility] 07/20/2019: prelim shows no cardiac activity, large abruption  Pregnancy complications:  - first delivery CS, four subsequent VBACs - prior hx of cHTN, superimposed preE  Past Medical History: Past Medical History:  Diagnosis Date  . Anxiety   . Depression   . Drug abuse, marijuana    with pregnancy  . Hx of PTL (preterm labor), current pregnancy   . Hypertension   . Late prenatal care   . Trichimoniasis    with pregnancy    Past Surgical History: Past Surgical History:  Procedure Laterality Date  . CESAREAN SECTION      Obstetrical History: OB History    Gravida  9   Para  5   Term  1   Preterm  4   AB  3   Living  5     SAB  2   TAB  1   Ectopic      Multiple  0   Live Births  2           Social History: Social History   Socioeconomic History  . Marital status: Single    Spouse name: Not on file  . Number of children: Not on file  . Years of education: Not on file  . Highest education level: Not on file  Occupational History  . Not on file  Tobacco Use  . Smoking status: Former Smoker    Packs/day: 0.10    Types: Cigarettes  . Smokeless tobacco: Never Used  Substance  and Sexual Activity  . Alcohol use: No  . Drug use: Yes    Types: Hashish, Marijuana  . Sexual activity: Yes    Birth control/protection: None  Other Topics Concern  . Not on file  Social History Narrative  . Not on file   Social Determinants of Health   Financial Resource Strain:   . Difficulty of Paying Living Expenses: Not on file  Food Insecurity:   . Worried About 09/17/2019 in the Last Year: Not on file  . Ran Out of Food in the Last Year: Not on file  Transportation Needs:   . Lack of Transportation (Medical): Not on file  . Lack of Transportation (Non-Medical): Not on file  Physical Activity:   . Days of Exercise per Week: Not on file  . Minutes of Exercise per Session: Not on file  Stress:   . Feeling of Stress : Not on file  Social Connections:   . Frequency of Communication with Friends and Family: Not on file  . Frequency of Social Gatherings with Friends and Family: Not on file  . Attends Religious Services: Not on file  . Active Member of Clubs or Organizations: Not on  file  . Attends Banker Meetings: Not on file  . Marital Status: Not on file    Family History: Family History  Problem Relation Age of Onset  . Hypertension Mother     Allergies: No Known Allergies  Medications Prior to Admission  Medication Sig Dispense Refill Last Dose  . labetalol (NORMODYNE) 200 MG tablet Take 200 mg by mouth 2 (two) times daily.   07/19/2019 at 1000am  . Prenat-FePro-Methf-Ca-Omega 3 (PRENATAL + COMPLETE MULTI) 18-0.8 & 290 MG THPK Take 1 tablet by mouth daily. 60 each 6 Past Month at Unknown time  . amLODipine (NORVASC) 10 MG tablet Take 1 tablet (10 mg total) by mouth daily. 30 tablet 2   . enalapril (VASOTEC) 20 MG tablet Take 1 tablet (20 mg total) by mouth daily. 30 tablet 2   . ibuprofen (ADVIL,MOTRIN) 600 MG tablet Take 1 tablet (600 mg total) by mouth every 6 (six) hours. 30 tablet 0   . oxyCODONE-acetaminophen (PERCOCET/ROXICET) 5-325  MG tablet Take 1 tablet by mouth every 6 (six) hours as needed (headache). (Patient not taking: Reported on 06/12/2018) 8 tablet 0      Review of Systems  All systems reviewed and negative except as stated in HPI  Physical Exam Blood pressure (!) 156/111, pulse 92, temperature 98.2 F (36.8 C), resp. rate 18, SpO2 99 %, unknown if currently breastfeeding. General appearance: alert, oriented, NAD Lungs: normal respiratory effort Heart: regular rate Abdomen: soft, non-tender; gravid Extremities: No calf swelling or tenderness Presentation: cephalic by formal US     Prenatal labs: Pending  ABO, Rh:   Antibody:   Rubella:   RPR:    HBsAg:    HIV:    GC/Chlamydia:   GBS:    2-hr GTT:  Genetic screening:   Anatomy US:    Results for orders placed or performed during the hospital encounter of 07/20/19 (from the past 24 hour(s))  Urinalysis, Routine w reflex microscopic   Collection Time: 07/20/19  4:23 AM  Result Value Ref Range   Color, Urine YELLOW YELLOW   APPearance CLEAR CLEAR   Specific Gravity, Urine 1.021 1.005 - 1.030   pH 6.0 5.0 - 8.0   Glucose, UA NEGATIVE NEGATIVE mg/dL   Hgb urine dipstick NEGATIVE NEGATIVE   Bilirubin Urine NEGATIVE NEGATIVE   Ketones, ur NEGATIVE NEGATIVE mg/dL   Protein, ur 30 (A) NEGATIVE mg/dL   Nitrite NEGATIVE NEGATIVE   Leukocytes,Ua NEGATIVE NEGATIVE   WBC, UA 0-5 0 - 5 WBC/hpf   Bacteria, UA NONE SEEN NONE SEEN   Squamous Epithelial / LPF 0-5 0 - 5   Mucus PRESENT     Patient Active Problem List   Diagnosis Date Noted  . Chronic hypertension with superimposed preeclampsia 06/07/2018  . Preterm labor in third trimester 06/05/2018  . Elevated fetal umbilical artery dopplers on ultrasound 05/22/2018  . Marginal insertion of umbilical cord affecting management of mother in third trimester 04/04/2018  . Hypertension in pregnancy, essential, antepartum, third trimester 03/28/2018  . History of preterm delivery, currently  pregnant in second trimester 03/28/2018  . Supervision of high risk pregnancy, antepartum, second trimester 03/28/2018  . Insufficient prenatal care, third trimester 03/28/2018  . History of cesarean section, low transverse 03/28/2018  . Anxiety 03/28/2018  . Smoker 03/28/2018  . Marijuana use 03/28/2018    Assessment: Shannon Gonzales is a 34 y.o. [id number] at [redacted]w[redacted]d here for IUFD.  #IUFD: likely secondary to large placental abruption seen on [place], with  cHTN with superimposed PreE contributing, UDS only positive for MJ and denied other drug use to MAU provider. Dating by sure LMP prior to pregnancy. Would like to hold infant after delivery.   #cHTN w superimposed PreE: difficult diagnosis to make at this point in absence of lab data and prior trend of BP's. Has had multiple severe range BP's since arrival and received IV labetalol x1, however on review of records patient often was in this range in the past so not clear if it is a new elevation or her baseline. However given presentation as well as persistent epigastric/RUQ pain will err on side of caution and treat by BP and symptoms.  - follow up PreE labs - Mg gtt - increase home labetalol to 200mg  BID, will likely need uptitration  #TOLAC: prior successful VBAC x4, cephalic on [place] and on SVE. Some epigastric/RUQ pain at this point given presentation more likely c/w PreE and less so rupture given lack of bleeding and large abruption seen on [place], but will cont to monitor closely.   #Labor: FB placed at 0600, start low dose pitocin #Pain: IV pain meds PRN, epidural upon request #GBS/ID: unknown #COVID: swab pending #MOC: discussion deferred   Clarnce Flock 07/20/2019, 5:13 AM

## 2019-07-20 NOTE — Anesthesia Preprocedure Evaluation (Addendum)
Anesthesia Evaluation  Patient identified by MRN, date of birth, ID band Patient awake    Reviewed: Allergy & Precautions, H&P , NPO status , Patient's Chart, lab work & pertinent test results  Airway Mallampati: I  TM Distance: >3 FB Neck ROM: full    Dental no notable dental hx.    Pulmonary neg pulmonary ROS, former smoker,    Pulmonary exam normal breath sounds clear to auscultation       Cardiovascular hypertension, negative cardio ROS Normal cardiovascular exam Rhythm:Regular Rate:Normal     Neuro/Psych negative neurological ROS     GI/Hepatic negative GI ROS, Neg liver ROS,   Endo/Other  negative endocrine ROS  Renal/GU negative Renal ROS  negative genitourinary   Musculoskeletal negative musculoskeletal ROS (+)   Abdominal Normal abdominal exam  (+)   Peds negative pediatric ROS (+)  Hematology negative hematology ROS (+)   Anesthesia Other Findings   Reproductive/Obstetrics (+) Pregnancy                            Anesthesia Physical  Anesthesia Plan  ASA: II  Anesthesia Plan: Epidural   Post-op Pain Management:    Induction:   PONV Risk Score and Plan:   Airway Management Planned:   Additional Equipment:   Intra-op Plan:   Post-operative Plan:   Informed Consent: I have reviewed the patients History and Physical, chart, labs and discussed the procedure including the risks, benefits and alternatives for the proposed anesthesia with the patient or authorized representative who has indicated his/her understanding and acceptance.       Plan Discussed with:   Anesthesia Plan Comments:         Anesthesia Quick Evaluation

## 2019-07-20 NOTE — Progress Notes (Signed)
Pt and father of baby state they do not want to speak with anyone from spiritual care. They also stated they did not want to be contacted after discharge.

## 2019-07-21 ENCOUNTER — Encounter (HOSPITAL_COMMUNITY): Payer: Self-pay | Admitting: Obstetrics and Gynecology

## 2019-07-21 LAB — COMPREHENSIVE METABOLIC PANEL
ALT: 19 U/L (ref 0–44)
AST: 45 U/L — ABNORMAL HIGH (ref 15–41)
Albumin: 2.2 g/dL — ABNORMAL LOW (ref 3.5–5.0)
Alkaline Phosphatase: 72 U/L (ref 38–126)
Anion gap: 7 (ref 5–15)
BUN: <5 mg/dL — ABNORMAL LOW (ref 6–20)
CO2: 22 mmol/L (ref 22–32)
Calcium: 8 mg/dL — ABNORMAL LOW (ref 8.9–10.3)
Chloride: 107 mmol/L (ref 98–111)
Creatinine, Ser: 0.46 mg/dL (ref 0.44–1.00)
GFR calc Af Amer: >60 mL/min (ref >60)
GFR calc non Af Amer: >60 mL/min (ref >60)
Glucose, Bld: 81 mg/dL (ref 70–99)
Potassium: 3.9 mmol/L (ref 3.5–5.1)
Sodium: 136 mmol/L (ref 135–145)
Total Bilirubin: <0.1 mg/dL — ABNORMAL LOW (ref 0.3–1.2)
Total Protein: 5.1 g/dL — ABNORMAL LOW (ref 6.5–8.1)

## 2019-07-21 LAB — CBC
HCT: 29.4 % — ABNORMAL LOW (ref 36.0–46.0)
Hemoglobin: 9.8 g/dL — ABNORMAL LOW (ref 12.0–15.0)
MCH: 31 pg (ref 26.0–34.0)
MCHC: 33.3 g/dL (ref 30.0–36.0)
MCV: 93 fL (ref 80.0–100.0)
Platelets: 163 10*3/uL (ref 150–400)
RBC: 3.16 MIL/uL — ABNORMAL LOW (ref 3.87–5.11)
RDW: 13.7 % (ref 11.5–15.5)
WBC: 17.6 10*3/uL — ABNORMAL HIGH (ref 4.0–10.5)
nRBC: 0 % (ref 0.0–0.2)

## 2019-07-21 LAB — RUBELLA SCREEN: Rubella: 6.25 index (ref >0.99)

## 2019-07-21 MED ORDER — DOCUSATE SODIUM 100 MG PO CAPS
100.0000 mg | ORAL_CAPSULE | Freq: Two times a day (BID) | ORAL | Status: DC
  Administered 2019-07-21 – 2019-07-22 (×3): 100 mg via ORAL
  Filled 2019-07-21 (×3): qty 1

## 2019-07-21 MED ORDER — FERROUS GLUCONATE 324 (38 FE) MG PO TABS
324.0000 mg | ORAL_TABLET | Freq: Every day | ORAL | Status: DC
  Administered 2019-07-22: 324 mg via ORAL
  Filled 2019-07-21: qty 1

## 2019-07-21 MED ORDER — OXYCODONE HCL 5 MG PO TABS
5.0000 mg | ORAL_TABLET | Freq: Three times a day (TID) | ORAL | Status: DC | PRN
  Administered 2019-07-21 – 2019-07-22 (×3): 5 mg via ORAL
  Filled 2019-07-21 (×3): qty 1

## 2019-07-21 MED ORDER — POTASSIUM CHLORIDE CRYS ER 20 MEQ PO TBCR
30.0000 meq | EXTENDED_RELEASE_TABLET | Freq: Two times a day (BID) | ORAL | Status: AC
  Administered 2019-07-21 – 2019-07-22 (×3): 30 meq via ORAL
  Filled 2019-07-21 (×3): qty 2

## 2019-07-21 MED ORDER — IBUPROFEN 600 MG PO TABS
600.0000 mg | ORAL_TABLET | Freq: Four times a day (QID) | ORAL | Status: DC
  Administered 2019-07-21 – 2019-07-22 (×3): 600 mg via ORAL
  Filled 2019-07-21 (×4): qty 1

## 2019-07-21 MED ORDER — ZOLPIDEM TARTRATE 5 MG PO TABS
5.0000 mg | ORAL_TABLET | Freq: Once | ORAL | Status: AC
  Administered 2019-07-21: 5 mg via ORAL
  Filled 2019-07-21: qty 1

## 2019-07-21 NOTE — Progress Notes (Signed)
Daily Antepartum Note  Admission Date: 07/20/2019 Current Date: 07/21/2019 9:41 AM  Shannon Gonzales is a 34 y.o. G9P5 PPD#1 VBAC/intact perineum (EBL 243m) @ 18616@ 273w4dHD#2, admitted for IUFD, abruption, cHTN with superimposed severe pre-eclampsia (BPs, LFTs).  Pregnancy complicated by: Patient Active Problem List   Diagnosis Date Noted  . IUFD at 2012 weeksr more of gestation 07/20/2019  . Placental abruption 07/20/2019  . Chronic hypertension with superimposed preeclampsia 06/07/2018  . Preterm labor in third trimester 06/05/2018  . Hypertension in pregnancy, essential, antepartum, third trimester 03/28/2018  . History of preterm delivery, currently pregnant in second trimester 03/28/2018  . Supervision of high risk pregnancy, antepartum, second trimester 03/28/2018  . No prenatal care in current pregnancy 03/28/2018  . History of VBAC 03/28/2018  . Anxiety 03/28/2018  . Smoker 03/28/2018  . Marijuana use 03/28/2018    Overnight/24hr events:  none  Subjective:  Having cramping as with other deliveries and desires oxycodone. No s/s of pre-eclampisa, lochia minimal.   Objective:    Current Vital Signs 24h Vital Sign Ranges  T 97.9 F (36.6 C) Temp  Avg: 97.9 F (36.6 C)  Min: 97.6 F (36.4 C)  Max: 98.4 F (36.9 C)  BP (!) 121/92 BP  Min: 103/70  Max: 153/108  HR 83 Pulse  Avg: 70.7  Min: 60  Max: 87  RR 17 Resp  Avg: 16.7  Min: 16  Max: 20  SaO2 100 %   SpO2  Avg: 98.3 %  Min: 95 %  Max: 100 %       24 Hour I/O Current Shift I/O  Time Ins Outs 01/10 0701 - 01/11 0700 In: 5838.3 [P.O.:2100; I.V.:3638.3] Out: 528372Urine:4865] 01/11 0701 - 01/11 1900 In: 240 [P.O.:240] Out: 1100 [Urine:1100]   Patient Vitals for the past 24 hrs:  BP Temp Temp src Pulse Resp SpO2  07/21/19 0733 (!) 121/92 97.9 F (36.6 C) Oral 83 17 100 %  07/21/19 0634 -- -- -- -- 16 --  07/21/19 0440 -- -- -- -- 17 --  07/21/19 0303 (!) 122/91 97.8 F (36.6 C) Oral 68 18 100 %  07/21/19  0042 -- -- -- -- 17 --  07/20/19 2353 (!) 132/98 97.9 F (36.6 C) Oral 79 18 100 %  07/20/19 2226 -- -- -- -- 19 --  07/20/19 2150 -- -- -- -- 18 --  07/20/19 1934 117/82 98 F (36.7 C) Oral 83 17 100 %  07/20/19 1800 -- -- -- -- 18 --  07/20/19 1745 136/80 98.4 F (36.9 C) Oral 85 16 100 %  07/20/19 1700 -- -- -- -- 16 --  07/20/19 1632 -- -- -- -- 16 --  07/20/19 1618 (!) 142/86 -- -- 76 16 --  07/20/19 1530 136/68 -- -- 72 16 --  07/20/19 1500 137/76 -- -- 68 16 --  07/20/19 1430 133/76 -- -- 60 16 --  07/20/19 1401 130/82 -- -- 87 16 --  07/20/19 1330 (!) 127/95 -- -- 72 16 --  07/20/19 1300 (!) 130/91 -- -- 68 16 --  07/20/19 1230 (!) 138/93 -- -- 70 16 --  07/20/19 1202 125/84 -- -- 66 16 --  07/20/19 1130 126/82 -- -- 64 16 --  07/20/19 1124 -- 97.6 F (36.4 C) Axillary -- -- --  07/20/19 1100 -- -- -- -- 16 --  07/20/19 1050 -- -- -- -- 16 --  07/20/19 1045 114/72 -- -- 68 16 97 %  07/20/19  1040 111/75 -- -- 66 16 98 %  07/20/19 1035 103/70 -- -- 64 16 97 %  07/20/19 1030 105/71 -- -- 64 16 97 %  07/20/19 1025 106/74 -- -- 65 16 96 %  07/20/19 1020 106/72 -- -- 63 16 96 %  07/20/19 1015 108/75 -- -- 67 16 95 %  07/20/19 1014 115/82 -- -- 60 16 --  07/20/19 1013 -- -- -- -- 18 --  07/20/19 1010 -- -- -- -- 18 --  07/20/19 1009 125/78 -- -- 64 20 --  07/20/19 1005 (!) 143/101 -- -- 83 18 100 %  07/20/19 1003 (!) 146/98 -- -- 73 18 --  07/20/19 1000 (!) 153/108 -- -- 71 18 100 %   UOP: >116m/hr  Physical exam: General: Well nourished, well developed female in no acute distress. Abdomen: nttp, firm fundus below the umbilicus Cardiovascular: S1, S2 normal, no murmur, rub or gallop, regular rate and rhythm Respiratory: CTAB Extremities: no clubbing, cyanosis or edema Skin: Warm and dry.   Medications: Current Facility-Administered Medications  Medication Dose Route Frequency Provider Last Rate Last Admin  . benzocaine-Menthol (DERMOPLAST) 20-0.5 % topical  spray 1 application  1 application Topical PRN Fair, Chelsea N, MD      . coconut oil  1 application Topical PRN Fair, CMarin Shutter MD      . witch hazel-glycerin (TUCKS) pad 1 application  1 application Topical PRN Fair, CMarin Shutter MD       And  . dibucaine (NUPERCAINAL) 1 % rectal ointment 1 application  1 application Rectal PRN Fair, CMarin Shutter MD      . diphenhydrAMINE (BENADRYL) capsule 25 mg  25 mg Oral Q6H PRN Fair, Chelsea N, MD      . enalapril (VASOTEC) tablet 10 mg  10 mg Oral Daily Fair, CMarin Shutter MD   10 mg at 07/21/19 0937  . ibuprofen (ADVIL) tablet 600 mg  600 mg Oral Q6H PAletha Halim MD      . lactated ringers infusion   Intravenous Continuous PAletha Halim MD 75 mL/hr at 07/20/19 2118 New Bag at 07/20/19 2118  . magnesium sulfate 40 grams in SWI 1000 mL OB infusion  2 g/hr Intravenous Continuous PAletha Halim MD 25 mL/hr at 07/20/19 1719 1 g/hr at 07/20/19 1719  . measles, mumps & rubella vaccine (MMR) injection 0.5 mL  0.5 mL Subcutaneous Once Fair, Chelsea N, MD      . ondansetron (ZOFRAN) tablet 4 mg  4 mg Oral Q4H PRN Fair, CMarin Shutter MD       Or  . ondansetron (ZOFRAN) injection 4 mg  4 mg Intravenous Q4H PRN FChauncey Mann MD   4 mg at 07/21/19 0308  . oxyCODONE (Oxy IR/ROXICODONE) immediate release tablet 5 mg  5 mg Oral Q8H PRN PAletha Halim MD      . potassium chloride SA (KLOR-CON) CR tablet 30 mEq  30 mEq Oral BID PAletha Halim MD   30 mEq at 07/21/19 0937  . prenatal multivitamin tablet 1 tablet  1 tablet Oral Q1200 Fair, Chelsea N, MD      . simethicone (MYLICON) chewable tablet 80 mg  80 mg Oral PRN Fair, CMarin Shutter MD      . Tdap (BOOSTRIX) injection 0.5 mL  0.5 mL Intramuscular Once FChauncey Mann MD        Labs:  Recent Labs  Lab 07/20/19 1258 07/20/19 1754 07/21/19 0829  WBC 18.1* 20.3* 17.6*  HGB 11.2* 10.4* 9.8*  HCT 33.1* 32.1* 29.4*  PLT 202 184 163    Recent Labs  Lab 07/20/19 0506 07/20/19 1258 07/20/19 1754  NA  134* 134* 134*  K 3.3* 3.7 3.4*  CL 106 103 104  CO2 20* 24 22  BUN <5* <5* <5*  CREATININE 0.46 0.49 0.43*  CALCIUM 8.9 8.5* 8.0*  PROT 5.6* 6.1* 5.5*  BILITOT 0.4 0.4 0.6  ALKPHOS 67 79 70  ALT '17 20 18  ' AST 50* 57* 45*  GLUCOSE 81 89 81   Conflict (See Lab Report): B POS/B POS Performed at [facility] [address], [place], [place] [address]   Radiology: no new imaging  Assessment & Plan:  Pt doing well *PP:  S/p depo provera. Po iron *SW consulted *CV: Mg until 1345 today. Continue enalapril *PPx: SCDs *FEN/GI: MIVF, regular diet *Dispo: likely tomorrow.   Durene Romans MD Attending Center for [facility])

## 2019-07-21 NOTE — Progress Notes (Signed)
Magnesium Sulfate increased to 2grams per hour per order. Carmelina Dane, RN Carmelina Dane, RN

## 2019-07-21 NOTE — Progress Notes (Signed)
Discussed post partum lactation after loss with patient. She will be using cabbage leaves and a well fitting bra. All questions answered.

## 2019-07-21 NOTE — Anesthesia Postprocedure Evaluation (Signed)
Anesthesia Post Note  Patient: Shannon Gonzales  Procedure(s) Performed: AN AD HOC LABOR EPIDURAL     Patient location during evaluation: Mother Baby Anesthesia Type: Epidural Level of consciousness: awake Pain management: satisfactory to patient Vital Signs Assessment: post-procedure vital signs reviewed and stable Respiratory status: spontaneous breathing Cardiovascular status: stable Anesthetic complications: no    Last Vitals:  Vitals:   07/21/19 0634 07/21/19 0733  BP:  (!) 121/92  Pulse:  83  Resp: 16 17  Temp:  36.6 C  SpO2:  100%    Last Pain:  Vitals:   07/21/19 0733  TempSrc: Oral  PainSc:    Pain Goal: Patients Stated Pain Goal: 1 (07/21/19 0440)                 Cephus Shelling

## 2019-07-21 NOTE — Progress Notes (Signed)
CSW received consult due to IUFD.  CSW available for support secondary to Spiritual Care Services and will await call from Chaplain before becoming involved.  CSW screening out referral at this time.  Yohana Bartha Boyd-Gilyard, MSW, LCSW Clinical Social Work [phone]  

## 2019-07-21 NOTE — Progress Notes (Signed)
  Initial visit with Angelique Blonder at her bedside to introduce spiritual care services and offer support upon the loss of her son, Adonis.  Maytte reports she is doing okay.  She says her grief ebbs and flows.  She lost another pregnancy around the same gestation when she was 34 and acknowledged that our approach to supporting that loss as well as her own methods of coping have improved.  This is her partner's first loss and she is trying to support him through it.  They have chosen [facility] to arrange cremation. Elesia is eager to get home to her 69 year old daughter who is a NICU graduate and has some medical challenges, but is in good hands of her older daughters.  I informed her about local resources for grief support including [facility] and our own support group this Wednesday.  She is also aware that spiritual care is available for continued support after discharge.  Please page as further needs arise.  Maryanna Shape. Carley Hammed, M.Div. [facility] Chaplain Pager [phone] Office [phone]      07/21/19 1645  Clinical Encounter Type  Visited With Patient  Visit Type Initial;Spiritual support  Stress Factors  Patient Stress Factors Loss

## 2019-07-22 DIAGNOSIS — R748 Abnormal levels of other serum enzymes: Secondary | ICD-10-CM | POA: Diagnosis present

## 2019-07-22 MED ORDER — HYDROXYZINE HCL 25 MG PO TABS: 25.0000 mg | ORAL_TABLET | Freq: Four times a day (QID) | ORAL | 0 refills | Status: DC | PRN

## 2019-07-22 MED ORDER — IBUPROFEN 600 MG PO TABS: 600.0000 mg | ORAL_TABLET | Freq: Four times a day (QID) | ORAL | 0 refills | Status: DC | PRN

## 2019-07-22 MED ORDER — DOCUSATE SODIUM 100 MG PO CAPS: 100.0000 mg | ORAL_CAPSULE | Freq: Two times a day (BID) | ORAL | 0 refills | Status: DC | PRN

## 2019-07-22 MED ORDER — ENALAPRIL MALEATE 10 MG PO TABS: 10.0000 mg | ORAL_TABLET | Freq: Every day | ORAL | 0 refills | Status: DC

## 2019-07-22 MED ORDER — FERROUS GLUCONATE 324 (38 FE) MG PO TABS: 324.0000 mg | ORAL_TABLET | Freq: Every day | ORAL | 0 refills | Status: DC

## 2019-07-22 MED FILL — DOK 100 MG CAPS: 100 | 30 days supply | Qty: 60 | Fill #0

## 2019-07-22 MED FILL — hydrOXYzine HCL 25 MG TABS: 25 | 7 days supply | Qty: 30 | Fill #0

## 2019-07-22 MED FILL — IBUPROFEN 600 MG TABLET: 600 | 7 days supply | Qty: 30 | Fill #0

## 2019-07-22 MED FILL — FERROUS GLUCONATE 324 MG TA: 324 (38 FE) | 60 days supply | Qty: 60 | Fill #0

## 2019-07-22 MED FILL — ENALAPRIL MALEATE 10 MG TAB: 10 | 30 days supply | Qty: 30 | Fill #0

## 2019-07-22 NOTE — Discharge Instructions (Signed)
Hypertension During Pregnancy Hypertension is also called high blood pressure. High blood pressure means that the force of your blood moving in your body is too strong. It can cause problems for you and your baby. Different types of high blood pressure can happen during pregnancy. The types are:  High blood pressure before you got pregnant. This is called chronic hypertension.  This can continue during your pregnancy. Your doctor will want to keep checking your blood pressure. You may need medicine to keep your blood pressure under control while you are pregnant. You will need follow-up visits after you have your baby.  High blood pressure that goes up during pregnancy when it was normal before. This is called gestational hypertension. It will usually get better after you have your baby, but your doctor will need to watch your blood pressure to make sure that it is getting better.  Very high blood pressure during pregnancy. This is called preeclampsia. Very high blood pressure is an emergency that needs to be checked and treated right away.  You may develop very high blood pressure after giving birth. This is called postpartum preeclampsia. This usually occurs within 48 hours after childbirth but may occur up to 6 weeks after giving birth. This is rare. How does this affect me? If you have high blood pressure during pregnancy, you have a higher chance of developing high blood pressure:  As you get older.  If you get pregnant again. In some cases, high blood pressure during pregnancy can cause:  Stroke.  Heart attack.  Damage to the kidneys, lungs, or liver.  Preeclampsia.  Jerky movements you cannot control (convulsions or seizures).  Problems with the placenta.   What can I do to lower my risk?   Keep a healthy weight.  Eat a healthy diet.  Follow what your doctor tells you about treating any medical problems that you had before becoming pregnant. It is very important to go to  all of your doctor visits. Your doctor will check your blood pressure and make sure that your pregnancy is progressing as it should. Treatment should start early if a problem is found.   Follow these instructions at home:  Take your blood pressure 1-2 times per day. Call the office if your blood pressure is 155 or higher for the top number or 105 or higher for the bottom number.    Eating and drinking   Drink enough fluid to keep your pee (urine) pale yellow.  Avoid caffeine. Lifestyle  Do not use any products that contain nicotine or tobacco, such as cigarettes, e-cigarettes, and chewing tobacco. If you need help quitting, ask your doctor.  Do not use alcohol or drugs.  Avoid stress.  Rest and get plenty of sleep.  Regular exercise can help. Ask your doctor what kinds of exercise are best for you. General instructions  Take over-the-counter and prescription medicines only as told by your doctor.  Keep all prenatal and follow-up visits as told by your doctor. This is important. Contact a doctor if:  You have symptoms that your doctor told you to watch for, such as: ? Headaches. ? Nausea. ? Vomiting. ? Belly (abdominal) pain. ? Dizziness. ? Light-headedness. Get help right away if:  You have: ? Very bad belly pain that does not get better with treatment. ? A very bad headache that does not get better. ? Vomiting that does not get better. ? Sudden, fast weight gain. ? Sudden swelling in your hands, ankles, or face. ?   Blood in your pee. ? Blurry vision. ? Double vision. ? Shortness of breath. ? Chest pain. ? Weakness on one side of your body. ? Trouble talking. Summary  High blood pressure is also called hypertension.  High blood pressure means that the force of your blood moving in your body is too strong.  High blood pressure can cause problems for you and your baby.  Keep all follow-up visits as told by your doctor. This is important. This information is  not intended to replace advice given to you by your health care provider. Make sure you discuss any questions you have with your health care provider. Document Released: 07/29/2010 Document Revised: 10/17/2018 Document Reviewed: 07/23/2018 Elsevier Patient Education  2020 Elsevier Inc.    Vaginal Delivery, Care After Refer to this sheet in the next few weeks. These discharge instructions provide you with information on caring for yourself after delivery. Your caregiver may also give you specific instructions. Your treatment has been planned according to the most current medical practices available, but problems sometimes occur. Call your caregiver if you have any problems or questions after you go home. HOME CARE INSTRUCTIONS 1. Take over-the-counter or prescription medicines only as directed by your caregiver or pharmacist. 2. Do not drink alcohol, especially if you are breastfeeding or taking medicine to relieve pain. 3. Do not smoke tobacco. 4. Continue to use good perineal care. Good perineal care includes: 1. Wiping your perineum from back to front 2. Keeping your perineum clean. 3. You can do sitz baths twice a day, to help keep this area clean 5. Do not use tampons, douche or have sex until your caregiver says it is okay. 6. Shower only and avoid sitting in submerged water, aside from sitz baths 7. Wear a well-fitting bra that provides breast support. 8. Eat healthy foods. 9. Drink enough fluids to keep your urine clear or pale yellow. 10. Eat high-fiber foods such as whole grain cereals and breads, [place] rice, beans, and fresh fruits and vegetables every day. These foods may help prevent or relieve constipation. 11. Avoid constipation with high fiber foods or medications, such as miralax or metamucil 12. Follow your caregiver's recommendations regarding resumption of activities such as climbing stairs, driving, lifting, exercising, or traveling. 13. Talk to your caregiver about  resuming sexual activities. Resumption of sexual activities is dependent upon your risk of infection, your rate of healing, and your comfort and desire to resume sexual activity. 14. Try to have someone help you with your household activities and your newborn for at least a few days after you leave the hospital. 15. Rest as much as possible. Try to rest or take a nap when your newborn is sleeping. 16. Increase your activities gradually. 17. Keep all of your scheduled postpartum appointments. It is very important to keep your scheduled follow-up appointments. At these appointments, your caregiver will be checking to make sure that you are healing physically and emotionally. SEEK MEDICAL CARE IF:   You are passing large clots from your vagina. Save any clots to show your caregiver.  You have a foul smelling discharge from your vagina.  You have trouble urinating.  You are urinating frequently.  You have pain when you urinate.  You have a change in your bowel movements.  You have increasing redness, pain, or swelling near your vaginal incision (episiotomy) or vaginal tear.  You have pus draining from your episiotomy or vaginal tear.  Your episiotomy or vaginal tear is separating.  You have painful,   hard, or reddened breasts.  You have a severe headache.  You have blurred vision or see spots.  You feel sad or depressed.  You have thoughts of hurting yourself or your newborn.  You have questions about your care, the care of your newborn, or medicines.  You are dizzy or light-headed.  You have a rash.  You have nausea or vomiting.  You were breastfeeding and have not had a menstrual period within 12 weeks after you stopped breastfeeding.  You are not breastfeeding and have not had a menstrual period by the 12th week after delivery.  You have a fever. SEEK IMMEDIATE MEDICAL CARE IF:   You have persistent pain.  You have chest pain.  You have shortness of breath.  You  faint.  You have leg pain.  You have stomach pain.  Your vaginal bleeding saturates two or more sanitary pads in 1 hour. MAKE SURE YOU:   Understand these instructions.  Will watch your condition.  Will get help right away if you are not doing well or get worse. Document Released: 06/23/2000 Document Revised: 11/10/2013 Document Reviewed: 02/21/2012 ExitCare Patient Information 2015 [employer]. This information is not intended to replace advice given to you by your health care provider. Make sure you discuss any questions you have with your health care provider.  Sitz Bath A sitz bath is a warm water bath taken in the sitting position. The water covers only the hips and butt (buttocks). We recommend using one that fits in the toilet, to help with ease of use and cleanliness. It may be used for either healing or cleaning purposes. Sitz baths are also used to relieve pain, itching, or muscle tightening (spasms). The water may contain medicine. Moist heat will help you heal and relax.  HOME CARE  Take 3 to 4 sitz baths a day. 18. Fill the bathtub half-full with warm water. 19. Sit in the water and open the drain a little. 20. Turn on the warm water to keep the tub half-full. Keep the water running constantly. 21. Soak in the water for 15 to 20 minutes. 22. After the sitz bath, pat the affected area dry. GET HELP RIGHT AWAY IF: You get worse instead of better. Stop the sitz baths if you get worse. MAKE SURE YOU:  Understand these instructions.  Will watch your condition.  Will get help right away if you are not doing well or get worse. Document Released: 08/03/2004 Document Revised: 03/20/2012 Document Reviewed: 10/24/2010 ExitCare Patient Information 2015 ExitCare, [employer]. This information is not intended to replace advice given to you by your health care provider. Make sure you discuss any questions you have with your health care provider.    

## 2019-07-22 NOTE — Progress Notes (Signed)
Reviewed  Comfort  Follow up with patient and  Perinatal loss  Signs and symptoms of pih reviewed also  Prescriptions   Reviewed  No questions  From patient

## 2019-07-22 NOTE — Progress Notes (Signed)
CSW made CPS  [place] CPS report for MOB's positive UDS for THC. Report made to [facility]. CPS will follow-up with MOB within 72 hours.   Blaine Hamper, MSW, LCSW Clinical Social Work [phone]

## 2019-07-22 NOTE — Lactation Note (Signed)
Lactation Consultation Note  Patient Name: [name] Date: 07/22/2019   Mom's breasts are already getting full. She requested a hand pump so that she could give EBM to her 33-yr old. I provided her with a hand pump with a size 21 flange, which looks appropriate for her nipple diameter at rest. Mom says she knows how to wash pump parts, etc.   I had noticed that Mom's UDS was + for [facility] on admission. I asked her not to use marijuana while providing breast milk for her 33-yr old. She said she wouldn't. Mom is also taking enalapril 10 mg & hydroxyzine 25mg .   Mom is active on [facility]. I also provided her with a hand-out on receiving WIC for 6 months after a loss.  [facility] 07/22/2019, 10:20 AM

## 2019-07-22 NOTE — Plan of Care (Signed)
  Problem: Education: Goal: Knowledge of General Education information will improve Description: Including pain rating scale, medication(s)/side effects and non-pharmacologic comfort measures Outcome: Completed/Met   Problem: Health Behavior/Discharge Planning: Goal: Ability to manage health-related needs will improve Outcome: Completed/Met   Problem: Clinical Measurements: Goal: Diagnostic test results will improve Outcome: Completed/Met   Problem: Elimination: Goal: Will not experience complications related to bowel motility Outcome: Completed/Met   Problem: Pain Managment: Goal: General experience of comfort will improve Outcome: Completed/Met   Problem: Safety: Goal: Ability to remain free from injury will improve Outcome: Completed/Met   Problem: Education: Goal: Knowledge of condition will improve Outcome: Completed/Met   Problem: Activity: Goal: Will verbalize the importance of balancing activity with adequate rest periods Outcome: Completed/Met

## 2019-07-23 LAB — SURGICAL PATHOLOGY

## 2019-07-28 ENCOUNTER — Ambulatory Visit: Payer: Medicaid Other

## 2019-07-28 NOTE — BH Specialist Note (Signed)
Pt did not arrive to video visit and did not answer the phone ; Left HIPPA-compliant message to call back [name] from [facility] at [phone].  ; left MyChart message for patient.    Integrated Behavioral Health via Telemedicine Video Visit  07/28/2019 Shannon Gonzales [phone]   Rae Lips

## 2019-07-29 ENCOUNTER — Other Ambulatory Visit: Payer: Self-pay

## 2019-07-29 ENCOUNTER — Ambulatory Visit: Payer: Medicaid Other | Admitting: Clinical

## 2019-07-29 DIAGNOSIS — Z91199 Patient's noncompliance with other medical treatment and regimen due to unspecified reason: Secondary | ICD-10-CM

## 2019-07-29 DIAGNOSIS — Z5329 Procedure and treatment not carried out because of patient's decision for other reasons: Secondary | ICD-10-CM

## 2019-08-01 ENCOUNTER — Ambulatory Visit: Payer: Medicaid Other

## 2019-08-19 ENCOUNTER — Ambulatory Visit: Payer: Medicaid Other | Admitting: Student

## 2019-08-21 ENCOUNTER — Encounter: Payer: Self-pay | Admitting: Family Medicine

## 2019-08-21 ENCOUNTER — Ambulatory Visit: Payer: Medicaid Other | Admitting: Obstetrics & Gynecology

## 2019-08-21 NOTE — Progress Notes (Deleted)
   Patient did not show up today for her scheduled appointment.   Aravind Chrismer, MD, FACOG Obstetrician & Gynecologist, [facility], [facility]  

## 2019-12-18 ENCOUNTER — Ambulatory Visit (INDEPENDENT_AMBULATORY_CARE_PROVIDER_SITE_OTHER): Payer: Medicaid Other | Admitting: Primary Care

## 2020-03-02 ENCOUNTER — Other Ambulatory Visit: Payer: Self-pay

## 2020-03-02 ENCOUNTER — Emergency Department (HOSPITAL_COMMUNITY)
Admission: EM | Admit: 2020-03-02 | Discharge: 2020-03-02 | Disposition: A | Discharge status: Home / Self Care | Payer: Medicaid Other | Attending: Emergency Medicine | Admitting: Emergency Medicine

## 2020-03-02 ENCOUNTER — Encounter (HOSPITAL_COMMUNITY): Payer: Self-pay | Admitting: Emergency Medicine

## 2020-03-02 DIAGNOSIS — O10011 Pre-existing essential hypertension complicating pregnancy, first trimester: Secondary | ICD-10-CM | POA: Diagnosis not present

## 2020-03-02 DIAGNOSIS — Z79899 Other long term (current) drug therapy: Secondary | ICD-10-CM | POA: Diagnosis not present

## 2020-03-02 DIAGNOSIS — Z3A00 Weeks of gestation of pregnancy not specified: Secondary | ICD-10-CM | POA: Diagnosis not present

## 2020-03-02 DIAGNOSIS — U071 COVID-19: Secondary | ICD-10-CM | POA: Diagnosis not present

## 2020-03-02 DIAGNOSIS — Z87891 Personal history of nicotine dependence: Secondary | ICD-10-CM | POA: Insufficient documentation

## 2020-03-02 DIAGNOSIS — O98511 Other viral diseases complicating pregnancy, first trimester: Secondary | ICD-10-CM | POA: Insufficient documentation

## 2020-03-02 DIAGNOSIS — Z7982 Long term (current) use of aspirin: Secondary | ICD-10-CM | POA: Diagnosis not present

## 2020-03-02 DIAGNOSIS — Z3490 Encounter for supervision of normal pregnancy, unspecified, unspecified trimester: Secondary | ICD-10-CM

## 2020-03-02 DIAGNOSIS — R0602 Shortness of breath: Secondary | ICD-10-CM | POA: Diagnosis present

## 2020-03-02 LAB — BASIC METABOLIC PANEL
Anion gap: 11 (ref 5–15)
BUN: <5 mg/dL — ABNORMAL LOW (ref 6–20)
CO2: 21 mmol/L — ABNORMAL LOW (ref 22–32)
Calcium: 8.7 mg/dL — ABNORMAL LOW (ref 8.9–10.3)
Chloride: 103 mmol/L (ref 98–111)
Creatinine, Ser: 0.39 mg/dL — ABNORMAL LOW (ref 0.44–1.00)
GFR calc Af Amer: >60 mL/min (ref >60)
GFR calc non Af Amer: >60 mL/min (ref >60)
Glucose, Bld: 100 mg/dL — ABNORMAL HIGH (ref 70–99)
Potassium: 2.9 mmol/L — ABNORMAL LOW (ref 3.5–5.1)
Sodium: 135 mmol/L (ref 135–145)

## 2020-03-02 LAB — CBC
HCT: 36.5 % (ref 36.0–46.0)
Hemoglobin: 11.8 g/dL — ABNORMAL LOW (ref 12.0–15.0)
MCH: 29.1 pg (ref 26.0–34.0)
MCHC: 32.3 g/dL (ref 30.0–36.0)
MCV: 89.9 fL (ref 80.0–100.0)
Platelets: 218 10*3/uL (ref 150–400)
RBC: 4.06 MIL/uL (ref 3.87–5.11)
RDW: 14.4 % (ref 11.5–15.5)
WBC: 6.9 10*3/uL (ref 4.0–10.5)
nRBC: 0 % (ref 0.0–0.2)

## 2020-03-02 LAB — POC URINE PREG, ED: Preg Test, Ur: POSITIVE — AB

## 2020-03-02 LAB — SARS CORONAVIRUS 2 BY RT PCR (HOSPITAL ORDER, PERFORMED IN ~~LOC~~ HOSPITAL LAB): SARS Coronavirus 2: POSITIVE — AB

## 2020-03-02 MED ORDER — POTASSIUM CHLORIDE CRYS ER 20 MEQ PO TBCR
40.0000 meq | EXTENDED_RELEASE_TABLET | Freq: Once | ORAL | Status: AC
  Administered 2020-03-02: 40 meq via ORAL
  Filled 2020-03-02: qty 2

## 2020-03-02 NOTE — ED Triage Notes (Signed)
Patient arrives by POV with complaints of shortness of breath and cough. 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 Over the past week patient has been short of breath such as blowing her nose or walking upstairs. Pt also states she thinks she is pregnant. Pt stated that she thinks she is 10-[redacted] weeks pregnant but she has not followed up with an OB just took a pregnancy test. No pregnancy related concerns.

## 2020-03-02 NOTE — ED Provider Notes (Signed)
Shannon Gonzales [facility] EMERGENCY DEPARTMENT Provider Note   CSN: [id number] Arrival date & time: 03/02/20  1523     History Chief Complaint  Patient presents with  . Shortness of Breath  . Cough  . Possible Pregnancy    Shannon Gonzales Gonzales is a 34 y.o. female with past medical history of high risk pregnancy, IUFD 20 weeks or greater, hypertension, presenting to the emergency department with positive Covid exposure and symptoms for 2 days.  She states she is feeling some intermittent shortness of breath that is worse with exertion.  She also has a cough.  She sometimes has some nausea after coughing.  She also states she had a positive home pregnancy test recently, she estimates her pregnancy to be 10 to [redacted] weeks gestation.  She has irregular periods is unsure of her LMP.  She denies fevers, abdominal pain, vaginal bleeding.  She has not yet received prenatal care.  She is unsure if she wants to keep this pregnancy.  The history is provided by the patient.       Past Medical History:  Diagnosis Date  . Anxiety   . Depression   . Drug abuse, marijuana    with pregnancy  . Hx of PTL (preterm labor), current pregnancy   . Hypertension   . Late prenatal care   . Trichimoniasis    with pregnancy    Patient Active Problem List   Diagnosis Date Noted  . Elevated liver enzymes 07/22/2019  . IUFD at 20 weeks or more of gestation 07/20/2019  . Placental abruption 07/20/2019  . Chronic hypertension with superimposed preeclampsia 06/07/2018  . Preterm labor in third trimester 06/05/2018  . Hypertension in pregnancy, essential, antepartum, third trimester 03/28/2018  . History of preterm delivery, currently pregnant in second trimester 03/28/2018  . Supervision of high risk pregnancy, antepartum, second trimester 03/28/2018  . No prenatal care in current pregnancy 03/28/2018  . History of VBAC 03/28/2018  . Anxiety 03/28/2018  . Smoker 03/28/2018  . Marijuana use 03/28/2018     Past Surgical History:  Procedure Laterality Date  . CESAREAN SECTION       OB History    Gravida  9   Para  6   Term  1   Preterm  5   AB  3   Living  5     SAB  2   TAB  1   Ectopic      Multiple  0   Live Births  2           Family History  Problem Relation Age of Onset  . Hypertension Mother     Social History   Tobacco Use  . Smoking status: Former Smoker    Packs/day: 0.10    Types: Cigarettes  . Smokeless tobacco: Never Used  Substance Use Topics  . Alcohol use: No  . Drug use: Yes    Types: Hashish, Marijuana    Home Medications Prior to Admission medications   Medication Sig Start Date End Date Taking? Authorizing Provider  aspirin (BAYER LOW DOSE) 81 MG EC tablet Take 81 mg by mouth daily. Swallow whole.   Yes [provider]  enalapril (VASOTEC) 10 MG tablet Take 1 tablet (10 mg total) by mouth daily. 07/22/19 03/02/20 Yes [place] Bing, MD  calcium carbonate (TUMS - DOSED IN MG ELEMENTAL CALCIUM) 500 MG chewable tablet Chew 4 tablets by mouth as needed for indigestion or heartburn. Patient not taking: Reported on 03/02/2020  [provider]  docusate sodium (COLACE) 100 MG capsule Take 1 capsule (100 mg total) by mouth 2 (two) times daily as needed for mild constipation. Patient not taking: Reported on 03/02/2020 07/22/19   [place] Bing, MD  ferrous gluconate (FERGON) 324 MG tablet Take 1 tablet (324 mg total) by mouth daily with breakfast. Patient not taking: Reported on 03/02/2020 07/23/19 03/02/20  [place] Bing, MD  hydrOXYzine (ATARAX/VISTARIL) 25 MG tablet Take 1 tablet (25 mg total) by mouth every 6 (six) hours as needed for anxiety (sleep). Patient not taking: Reported on 03/02/2020 07/22/19   [place] Bing, MD  ibuprofen (ADVIL) 600 MG tablet Take 1 tablet (600 mg total) by mouth every 6 (six) hours as needed. Patient not taking: Reported on 03/02/2020 07/22/19   Ortley Bing, MD    Allergies     Patient has no known allergies.  Review of Systems   Review of Systems  All other systems reviewed and are negative.   Physical Exam Updated Vital Signs BP (!) 135/93 (BP Location: Right Arm)   Pulse 89   Temp 98.4 F (36.9 C)   Resp 18   SpO2 98%   Physical Exam Vitals and nursing note reviewed.  Constitutional:      General: She is not in acute distress.    Appearance: She is well-developed. She is not ill-appearing.  HENT:     Head: Normocephalic and atraumatic.  Eyes:     Conjunctiva/sclera: Conjunctivae normal.  Cardiovascular:     Rate and Rhythm: Normal rate and regular rhythm.  Pulmonary:     Effort: Pulmonary effort is normal. No respiratory distress.     Breath sounds: Normal breath sounds.  Abdominal:     General: Bowel sounds are normal.     Palpations: Abdomen is soft.     Tenderness: There is no abdominal tenderness.  Musculoskeletal:     Right lower leg: No edema.     Left lower leg: No edema.  Skin:    General: Skin is warm.  Neurological:     Mental Status: She is alert.  Psychiatric:        Behavior: Behavior normal.     ED Results / Procedures / Treatments   Labs (all labs ordered are listed, but only abnormal results are displayed) Labs Reviewed  SARS CORONAVIRUS 2 BY RT PCR (HOSPITAL ORDER, PERFORMED IN [place] [facility]) - Abnormal; Notable for the following components:      Result Value   SARS Coronavirus 2 POSITIVE (*)    All other components within normal limits  BASIC METABOLIC PANEL - Abnormal; Notable for the following components:   Potassium 2.9 (*)    CO2 21 (*)    Glucose, Bld 100 (*)    BUN <5 (*)    Creatinine, Ser 0.39 (*)    Calcium 8.7 (*)    All other components within normal limits  CBC - Abnormal; Notable for the following components:   Hemoglobin 11.8 (*)    All other components within normal limits  POC URINE PREG, ED - Abnormal; Notable for the following components:   Preg Test, Ur POSITIVE (*)     All other components within normal limits    EKG None  Radiology No results found.  Procedures Procedures (including critical care time)  Medications Ordered in ED Medications  potassium chloride SA (KLOR-CON) CR tablet 40 mEq (has no administration in time range)    ED Course  I have reviewed the triage vital  signs and the nursing notes.  Pertinent labs & imaging results that were available during my care of the patient were reviewed by me and considered in my medical decision making (see chart for details).    MDM Rules/Calculators/A&P                          Patient presenting with positive home pregnancy test and Covid exposure with symptoms x2 days.  Symptoms include cough and intermittent shortness of breath.  She is overall well-appearing and in no distress.  O2 saturation 100% on room air.  Covid test is positive. Confirmed urine preg as well. Labs are overall reassuring, mild hypokalemia, replaced orally. Recommend she follow with OB and treat symptoms as needed. Encouraged PO hydration, increase potassium rich foods. Discussed return precautions given current pregnancy and COVID illness, high risk for PE. [facility] infusion clinic notified for infusion. Discussed isolation guidelines. Work note provided.   Pt discussed with Dr. Stevie Kern, agrees with workup and care plan.  Final Clinical Impression(s) / ED Diagnoses Final diagnoses:  COVID-19  Pregnancy, unspecified gestational age    Rx / DC Orders ED Discharge Orders    None       Leisa Gault, [place] N, PA-C 03/02/20 2053    Milagros Loll, MD 03/04/20 [phone]

## 2020-03-02 NOTE — ED Notes (Signed)
Pt ambulated in the room without assistance and maintained oxygen saturations of 100% on room air.

## 2020-03-02 NOTE — Discharge Instructions (Addendum)
Stay hydrated.  Treat pain or fever with tylenol. Establish OB care, begin taking a prenatal vitamin. Increase your potassium rich-foods, as your potassium was slightly low today. Your OB provide may not see you in clinic while you are COVID positive. You may be able to request telemedicine visit in the mean time. Discuss this with your OB office. Return to the ED if you experience worsening/more persistent shortness of breath, chest pain, if you are unable to keep liquids down, contractions/abdominal pain, vaginal bleeding. Current CDC guidelines recommend home isolation for at least 10 days since symptoms began, and from there at least 24 hours fever free without medication and symptoms improving.

## 2020-03-03 ENCOUNTER — Encounter: Payer: Self-pay | Admitting: Oncology

## 2020-03-03 ENCOUNTER — Other Ambulatory Visit: Payer: Self-pay | Admitting: Oncology

## 2020-03-03 DIAGNOSIS — U071 COVID-19: Secondary | ICD-10-CM

## 2020-03-03 NOTE — Progress Notes (Signed)
I connected by phone with [name] to discuss the potential use of an new treatment for mild to moderate COVID-19 viral infection in non-hospitalized patients.   This patient is a age/sex that meets the FDA criteria for Emergency Use Authorization of casirivimab\imdevimab.  Has a (+) direct SARS-CoV-2 viral test result 1. Has mild or moderate COVID-19  2. Is ? 33 years of age and weighs ? 40 kg 3. Is NOT hospitalized due to COVID-19 4. Is NOT requiring oxygen therapy or requiring an increase in baseline oxygen flow rate due to COVID-19 5. Is within 10 days of symptom onset 6. Has at least one of the high risk factor(s) for progression to severe COVID-19 and/or hospitalization as defined in EUA. ? Specific high risk criteria : HTN, [redacted] weeks pregnant   Symptom onset  02/29/20   I have spoken and communicated the following to the patient or parent/caregiver:   1. FDA has authorized the emergency use of casirivimab\imdevimab for the treatment of mild to moderate COVID-19 in adults and pediatric patients with positive results of direct SARS-CoV-2 viral testing who are 84 years of age and older weighing at least 40 kg, and who are at high risk for progressing to severe COVID-19 and/or hospitalization.   2. The significant known and potential risks and benefits of casirivimab\imdevimab, and the extent to which such potential risks and benefits are unknown.   3. Information on available alternative treatments and the risks and benefits of those alternatives, including clinical trials.   4. Patients treated with casirivimab\imdevimab should continue to self-isolate and use infection control measures (e.g., wear mask, isolate, social distance, avoid sharing personal items, clean and disinfect "high touch" surfaces, and frequent handwashing) according to CDC guidelines.    5. The patient or parent/caregiver has the option to accept or refuse casirivimab\imdevimab .   After reviewing this information  with the patient, The patient agreed to proceed with receiving casirivimab\imdevimab infusion and will be provided a copy of the Fact sheet prior to receiving the infusion.Shannon Gonzales, AGNP-C [phone] (Infusion Center Hotline)

## 2020-03-05 ENCOUNTER — Ambulatory Visit (HOSPITAL_COMMUNITY)
Admission: RE | Admit: 2020-03-05 | Discharge: 2020-03-05 | Disposition: A | Discharge status: Home / Self Care | Payer: Medicaid Other | Source: Ambulatory Visit | Attending: Pulmonary Disease | Admitting: Pulmonary Disease

## 2020-03-05 DIAGNOSIS — U071 COVID-19: Secondary | ICD-10-CM

## 2020-03-05 MED ORDER — ALBUTEROL SULFATE HFA 108 (90 BASE) MCG/ACT IN AERS: 2.0000 | INHALATION_SPRAY | Freq: Once | RESPIRATORY_TRACT | Status: DC | PRN

## 2020-03-05 MED ORDER — DIPHENHYDRAMINE HCL 50 MG/ML IJ SOLN: 50.0000 mg | Freq: Once | INTRAMUSCULAR | Status: DC | PRN

## 2020-03-05 MED ORDER — FAMOTIDINE IN NACL 20-0.9 MG/50ML-% IV SOLN: 20.0000 mg | Freq: Once | INTRAVENOUS | Status: DC | PRN

## 2020-03-05 MED ORDER — SODIUM CHLORIDE 0.9 % IV SOLN: INTRAVENOUS | Status: DC | PRN

## 2020-03-05 MED ORDER — METHYLPREDNISOLONE SODIUM SUCC 125 MG IJ SOLR: 125.0000 mg | Freq: Once | INTRAMUSCULAR | Status: DC | PRN

## 2020-03-05 MED ORDER — EPINEPHRINE 0.3 MG/0.3ML IJ SOAJ: 0.3000 mg | Freq: Once | INTRAMUSCULAR | Status: DC | PRN

## 2020-03-05 MED ORDER — SODIUM CHLORIDE 0.9 % IV SOLN
1200.0000 mg | Freq: Once | INTRAVENOUS | Status: AC
  Administered 2020-03-05: 1200 mg via INTRAVENOUS

## 2020-03-05 NOTE — Discharge Instructions (Signed)

## 2020-03-05 NOTE — Progress Notes (Signed)
  Diagnosis: COVID-19  Physician: Dr Luisa Hart white  Procedure: Covid Infusion Clinic Med: casirivimab\imdevimab infusion - Provided patient with casirivimab\imdevimab fact sheet for patients, parents and caregivers prior to infusion.  Complications: No immediate complications noted.  Discharge: Discharged home   Dustin Flock 03/05/2020

## 2020-04-18 ENCOUNTER — Encounter (HOSPITAL_COMMUNITY): Payer: Self-pay | Admitting: Obstetrics & Gynecology

## 2020-04-18 ENCOUNTER — Inpatient Hospital Stay (HOSPITAL_COMMUNITY)
Admission: AD | Admit: 2020-04-18 | Discharge: 2020-04-19 | Disposition: A | Discharge status: Home / Self Care | Payer: Medicaid Other | Attending: Obstetrics & Gynecology | Admitting: Obstetrics & Gynecology

## 2020-04-18 ENCOUNTER — Other Ambulatory Visit: Payer: Self-pay

## 2020-04-18 ENCOUNTER — Inpatient Hospital Stay (HOSPITAL_COMMUNITY): Payer: Medicaid Other

## 2020-04-18 DIAGNOSIS — O10012 Pre-existing essential hypertension complicating pregnancy, second trimester: Secondary | ICD-10-CM | POA: Diagnosis not present

## 2020-04-18 DIAGNOSIS — O99512 Diseases of the respiratory system complicating pregnancy, second trimester: Secondary | ICD-10-CM | POA: Insufficient documentation

## 2020-04-18 DIAGNOSIS — Z7982 Long term (current) use of aspirin: Secondary | ICD-10-CM | POA: Diagnosis not present

## 2020-04-18 DIAGNOSIS — R519 Headache, unspecified: Secondary | ICD-10-CM | POA: Diagnosis not present

## 2020-04-18 DIAGNOSIS — O10919 Unspecified pre-existing hypertension complicating pregnancy, unspecified trimester: Secondary | ICD-10-CM

## 2020-04-18 DIAGNOSIS — J189 Pneumonia, unspecified organism: Secondary | ICD-10-CM | POA: Diagnosis not present

## 2020-04-18 DIAGNOSIS — Z87891 Personal history of nicotine dependence: Secondary | ICD-10-CM | POA: Diagnosis not present

## 2020-04-18 DIAGNOSIS — Z8759 Personal history of other complications of pregnancy, childbirth and the puerperium: Secondary | ICD-10-CM | POA: Diagnosis not present

## 2020-04-18 DIAGNOSIS — M549 Dorsalgia, unspecified: Secondary | ICD-10-CM | POA: Diagnosis not present

## 2020-04-18 DIAGNOSIS — O26892 Other specified pregnancy related conditions, second trimester: Secondary | ICD-10-CM | POA: Diagnosis present

## 2020-04-18 DIAGNOSIS — O09292 Supervision of pregnancy with other poor reproductive or obstetric history, second trimester: Secondary | ICD-10-CM | POA: Insufficient documentation

## 2020-04-18 DIAGNOSIS — H538 Other visual disturbances: Secondary | ICD-10-CM | POA: Insufficient documentation

## 2020-04-18 DIAGNOSIS — Z79899 Other long term (current) drug therapy: Secondary | ICD-10-CM | POA: Insufficient documentation

## 2020-04-18 DIAGNOSIS — R42 Dizziness and giddiness: Secondary | ICD-10-CM | POA: Diagnosis not present

## 2020-04-18 DIAGNOSIS — Z3A19 19 weeks gestation of pregnancy: Secondary | ICD-10-CM

## 2020-04-18 DIAGNOSIS — O99322 Drug use complicating pregnancy, second trimester: Secondary | ICD-10-CM | POA: Insufficient documentation

## 2020-04-18 DIAGNOSIS — R55 Syncope and collapse: Secondary | ICD-10-CM | POA: Diagnosis not present

## 2020-04-18 LAB — COMPREHENSIVE METABOLIC PANEL
ALT: 12 U/L (ref 0–44)
AST: 26 U/L (ref 15–41)
Albumin: 2.9 g/dL — ABNORMAL LOW (ref 3.5–5.0)
Alkaline Phosphatase: 70 U/L (ref 38–126)
Anion gap: 11 (ref 5–15)
BUN: <5 mg/dL — ABNORMAL LOW (ref 6–20)
CO2: 21 mmol/L — ABNORMAL LOW (ref 22–32)
Calcium: 9.3 mg/dL (ref 8.9–10.3)
Chloride: 100 mmol/L (ref 98–111)
Creatinine, Ser: 0.54 mg/dL (ref 0.44–1.00)
GFR, Estimated: >60 mL/min (ref >60)
Glucose, Bld: 87 mg/dL (ref 70–99)
Potassium: 3.8 mmol/L (ref 3.5–5.1)
Sodium: 132 mmol/L — ABNORMAL LOW (ref 135–145)
Total Bilirubin: 0.3 mg/dL (ref 0.3–1.2)
Total Protein: 6.8 g/dL (ref 6.5–8.1)

## 2020-04-18 LAB — CBC
HCT: 33.5 % — ABNORMAL LOW (ref 36.0–46.0)
Hemoglobin: 11 g/dL — ABNORMAL LOW (ref 12.0–15.0)
MCH: 29.3 pg (ref 26.0–34.0)
MCHC: 32.8 g/dL (ref 30.0–36.0)
MCV: 89.3 fL (ref 80.0–100.0)
Platelets: 336 10*3/uL (ref 150–400)
RBC: 3.75 MIL/uL — ABNORMAL LOW (ref 3.87–5.11)
RDW: 16.3 % — ABNORMAL HIGH (ref 11.5–15.5)
WBC: 17.8 10*3/uL — ABNORMAL HIGH (ref 4.0–10.5)
nRBC: 0 % (ref 0.0–0.2)

## 2020-04-18 LAB — PROTEIN / CREATININE RATIO, URINE
Creatinine, Urine: 110.12 mg/dL
Protein Creatinine Ratio: 0.17 mg/mg{Cre} — ABNORMAL HIGH (ref 0.00–0.15)
Total Protein, Urine: 19 mg/dL

## 2020-04-18 MED ORDER — NIFEDIPINE 10 MG PO CAPS: 20.0000 mg | ORAL_CAPSULE | ORAL | Status: DC | PRN

## 2020-04-18 MED ORDER — LABETALOL HCL 5 MG/ML IV SOLN: 40.0000 mg | INTRAVENOUS | Status: DC | PRN

## 2020-04-18 MED ORDER — NIFEDIPINE 10 MG PO CAPS
10.0000 mg | ORAL_CAPSULE | ORAL | Status: DC | PRN
  Administered 2020-04-18: 10 mg via ORAL
  Filled 2020-04-18: qty 1

## 2020-04-18 MED ORDER — CYCLOBENZAPRINE HCL 5 MG PO TABS
10.0000 mg | ORAL_TABLET | Freq: Three times a day (TID) | ORAL | Status: DC | PRN
  Administered 2020-04-18: 10 mg via ORAL
  Filled 2020-04-18: qty 2

## 2020-04-18 MED ORDER — ACETAMINOPHEN 500 MG PO TABS
1000.0000 mg | ORAL_TABLET | Freq: Four times a day (QID) | ORAL | Status: DC | PRN
  Filled 2020-04-18: qty 2

## 2020-04-18 MED ORDER — IOHEXOL 350 MG/ML SOLN
60.0000 mL | Freq: Once | INTRAVENOUS | Status: AC | PRN
  Administered 2020-04-18: 60 mL via INTRAVENOUS

## 2020-04-18 MED ORDER — LABETALOL HCL 100 MG PO TABS
200.0000 mg | ORAL_TABLET | Freq: Once | ORAL | Status: AC
  Administered 2020-04-18: 200 mg via ORAL
  Filled 2020-04-18: qty 2

## 2020-04-18 NOTE — MAU Provider Note (Addendum)
History     CSN: [id number]  Arrival date and time: 04/18/20 [id number]   First Provider Initiated Contact with Patient 04/18/20 2004      Chief Complaint  Patient presents with  . Dizziness  . Headache  . Extremity Weakness   34 y.o. [id number] @19 .5 wks presenting with CP, back pain, lightheaded, dizzy, weakness. Reports onset of symptoms 2 days ago. Reports episode of syncope yesterday. Endorses HA today, took Tylenol and improved. CP has been ongoing for months, not feeling now but had earlier today. Denies SOB. Hx of CHTN and 26 wk IUFD with HELLP 7 months ago. She has not taken BP meds in 2 months. Also c/o left upper back pain for last few weeks, worse today.   OB History    Gravida  10   Para  6   Term  1   Preterm  5   AB  3   Living  5     SAB  2   TAB  1   Ectopic      Multiple  0   Live Births  2           Past Medical History:  Diagnosis Date  . Anxiety   . Depression   . Drug abuse, marijuana    with pregnancy  . Hx of PTL (preterm labor), current pregnancy   . Hypertension   . Late prenatal care   . Trichimoniasis    with pregnancy    Past Surgical History:  Procedure Laterality Date  . CESAREAN SECTION      Family History  Problem Relation Age of Onset  . Hypertension Mother     Social History   Tobacco Use  . Smoking status: Former Smoker    Packs/day: 0.10    Types: Cigarettes  . Smokeless tobacco: Never Used  Vaping Use  . Vaping Use: Never used  Substance Use Topics  . Alcohol use: No  . Drug use: Yes    Types: Hashish, Marijuana    Allergies: No Known Allergies  Medications Prior to Admission  Medication Sig Dispense Refill Last Dose  . acetaminophen (TYLENOL) 500 MG tablet Take 1,000 mg by mouth every 6 (six) hours as needed.   04/18/2020 at 1300  . aspirin (BAYER LOW DOSE) 81 MG EC tablet Take 81 mg by mouth daily. Swallow whole.   04/18/2020 at 1000  . calcium carbonate (TUMS - DOSED IN MG ELEMENTAL CALCIUM)  500 MG chewable tablet Chew 4 tablets by mouth as needed for indigestion or heartburn. (Patient not taking: Reported on 03/02/2020)     . docusate sodium (COLACE) 100 MG capsule Take 1 capsule (100 mg total) by mouth 2 (two) times daily as needed for mild constipation. (Patient not taking: Reported on 03/02/2020) 60 capsule 0   . enalapril (VASOTEC) 10 MG tablet Take 1 tablet (10 mg total) by mouth daily. 60 tablet 0   . ferrous gluconate (FERGON) 324 MG tablet Take 1 tablet (324 mg total) by mouth daily with breakfast. (Patient not taking: Reported on 03/02/2020) 60 tablet 0   . hydrOXYzine (ATARAX/VISTARIL) 25 MG tablet Take 1 tablet (25 mg total) by mouth every 6 (six) hours as needed for anxiety (sleep). (Patient not taking: Reported on 03/02/2020) 30 tablet 0   . ibuprofen (ADVIL) 600 MG tablet Take 1 tablet (600 mg total) by mouth every 6 (six) hours as needed. (Patient not taking: Reported on 03/02/2020) 30 tablet 0     Review of  Systems  Constitutional: Negative for chills and fever.  HENT: Positive for congestion.   Eyes: Negative for visual disturbance.  Respiratory: Negative for cough and shortness of breath.   Cardiovascular: Positive for chest pain. Negative for leg swelling.  Gastrointestinal: Negative for abdominal pain.  Genitourinary: Negative for dysuria, frequency, hematuria, urgency and vaginal bleeding.  Musculoskeletal: Positive for back pain.  Neurological: Positive for dizziness, syncope, weakness, light-headedness and headaches.   Physical Exam   Patient Vitals for the past 24 hrs:  BP Temp Temp src Pulse Resp SpO2 Height Weight  04/19/20 0118 136/89 98.2 F (36.8 C) Oral 89 20 97 % - -  04/19/20 0046 136/89 - - 92 - - - -  04/19/20 0031 (!) 143/85 - - (!) 101 - - - -  04/19/20 0016 (!) 131/96 - - 97 - - - -  04/19/20 0001 (!) 145/107 - - (!) 105 - - - -  04/18/20 2350 (!) 131/94 - - (!) 105 - - - -  04/18/20 2346 (!) 131/94 - - (!) 105 - - - -  04/18/20 2342 (!)  143/96 - - 99 - - - -  04/18/20 2340 (!) 147/109 - - (!) 104 - - - -  04/18/20 2240 (!) 154/109 - - (!) 110 - 99 % - -  04/18/20 2146 (!) 167/128 - - (!) 101 - - - -  04/18/20 2131 (!) 151/104 - - (!) 103 - - - -  04/18/20 2116 (!) 143/104 - - (!) 112 - - - -  04/18/20 2101 (!) 149/103 - - (!) 107 - - - -  04/18/20 2046 (!) 153/98 - - (!) 111 - - - -  04/18/20 2031 136/85 - - (!) 113 - - - -  04/18/20 2016 (!) 151/101 - - (!) 112 - - - -  04/18/20 1959 (!) 160/131 - - - - - - -  04/18/20 1950 (!) 160/131 - - (!) 114 - - - -  04/18/20 1930 (!) 191/137 98.2 F (36.8 C) Oral (!) 114 15 100 % 5' (1.524 m) 69.1 kg  04/18/20 1824 (!) 185/135 98.2 F (36.8 C) - (!) 110 18 - - -   Physical Exam Vitals and nursing note reviewed.  Constitutional:      General: She is not in acute distress.    Appearance: Normal appearance.  HENT:     Head: Normocephalic and atraumatic.  Cardiovascular:     Rate and Rhythm: Regular rhythm. Tachycardia present.  Pulmonary:     Effort: Pulmonary effort is normal. No respiratory distress.     Breath sounds: No stridor. No wheezing, rhonchi or rales.  Abdominal:     General: There is no distension.     Palpations: Abdomen is soft.     Tenderness: There is no abdominal tenderness. There is no right CVA tenderness or left CVA tenderness.  Musculoskeletal:        General: Normal range of motion.     Cervical back: Normal and normal range of motion.     Thoracic back: Normal.     Lumbar back: Normal.  Skin:    General: Skin is warm and dry.  Neurological:     General: No focal deficit present.     Mental Status: She is alert and oriented to person, place, and time.  Psychiatric:        Mood and Affect: Mood normal.        Behavior: Behavior normal.   FHT  169  Results for orders placed or performed during the hospital encounter of 04/18/20 (from the past 24 hour(s))  Protein / creatinine ratio, urine     Status: Abnormal   Collection Time: 04/18/20   7:42 PM  Result Value Ref Range   Creatinine, Urine 110.12 mg/dL   Total Protein, Urine 19 mg/dL   Protein Creatinine Ratio 0.17 (H) 0.00 - 0.15 mg/mg[Cre]  Comprehensive metabolic panel     Status: Abnormal   Collection Time: 04/18/20  7:56 PM  Result Value Ref Range   Sodium 132 (L) 135 - 145 mmol/L   Potassium 3.8 3.5 - 5.1 mmol/L   Chloride 100 98 - 111 mmol/L   CO2 21 (L) 22 - 32 mmol/L   Glucose, Bld 87 70 - 99 mg/dL   BUN <5 (L) 6 - 20 mg/dL   Creatinine, Ser 4.26 0.44 - 1.00 mg/dL   Calcium 9.3 8.9 - 83.4 mg/dL   Total Protein 6.8 6.5 - 8.1 g/dL   Albumin 2.9 (L) 3.5 - 5.0 g/dL   AST 26 15 - 41 U/L   ALT 12 0 - 44 U/L   Alkaline Phosphatase 70 38 - 126 U/L   Total Bilirubin 0.3 0.3 - 1.2 mg/dL   GFR, Estimated >19 >62 mL/min   Anion gap 11 5 - 15  CBC     Status: Abnormal   Collection Time: 04/18/20  7:56 PM  Result Value Ref Range   WBC 17.8 (H) 4.0 - 10.5 K/uL   RBC 3.75 (L) 3.87 - 5.11 MIL/uL   Hemoglobin 11.0 (L) 12.0 - 15.0 g/dL   HCT 22.9 (L) 36 - 46 %   MCV 89.3 80.0 - 100.0 fL   MCH 29.3 26.0 - 34.0 pg   MCHC 32.8 30.0 - 36.0 g/dL   RDW 79.8 (H) 92.1 - 19.4 %   Platelets 336 150 - 400 K/uL   nRBC 0.0 0.0 - 0.2 %   MAU Course  Procedures Procardia Tylenol Flexeril  MDM Labs ordered. EKG complete, interpretation by Dr. Norton Pastel tach, non-specific changed, no STEMI. CT ordered to r/o PE.  Transfer of care given to Rosalita Levan, CNM  04/18/2020 9:09 PM   Reassessment upon assuming care of patient  Patient currently in CT  BP severe range after procardia administration - Labetalol 200mg  ordered and given   CT reviewed:  CT Angio Chest PE W and/or Wo Contrast  Result Date: 04/18/2020 CLINICAL DATA:  34 year old female with concern for pulmonary embolism. EXAM: CT ANGIOGRAPHY CHEST WITH CONTRAST TECHNIQUE: Multidetector CT imaging of the chest was performed using the standard protocol during bolus administration of intravenous  contrast. Multiplanar CT image reconstructions and MIPs were obtained to evaluate the vascular anatomy. CONTRAST:  60mL OMNIPAQUE IOHEXOL 350 MG/ML SOLN COMPARISON:  Chest radiograph dated 02/11/2016. FINDINGS: Cardiovascular: Top-normal cardiac size. No pericardial effusion. The thoracic aorta is unremarkable. 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 No pulmonary artery embolus identified. Mediastinum/Nodes: Mildly enlarged right hilar lymph node measures 11 mm in short axis. Subcarinal lymph node measures 12 mm. The esophagus is grossly unremarkable. No mediastinal fluid collection. Lungs/Pleura: Bilateral clusters of ground-glass opacity primarily involving the upper lobes most consistent with multifocal pneumonia, likely viral or atypical in etiology including COVID-19. Clinical correlation is recommended. No lobar consolidation, pleural effusion, or pneumothorax. The central airways are patent. Upper Abdomen: No acute abnormality. Musculoskeletal: No chest wall abnormality. No acute or significant osseous findings. Review of  the MIP images confirms the above findings. IMPRESSION: 1. No CT evidence of pulmonary embolism. 2. Multifocal pneumonia, likely viral or atypical in etiology. Clinical correlation is recommended. 3. Mildly enlarged right hilar and subcarinal lymph nodes, likely reactive. 4. Left-sided aortic arch with aberrant right subclavian artery anatomy. Electronically Signed   By: Elgie Collard M.D.   On: 04/18/2020 22:29   Consult with Dr Macon Large with assessment and management. Based on lab results with elevated WBC and CT results, recommends amoxicillin 1g TID for 5 days and z pack for 5 days, with close follow up in the office.   Discussed results with patient and discussed importance of close follow up with risk factors and current diagnoses. Patient verbalizes understanding. Rx for medication including Labetalol 200mg  BID sent to pharmacy of choice.  Message sent to [facility] to schedule patient NOB appointment.   Discussed reasons to return to MAU. Make appointment to initiate prenatal care. Return to MAU as needed. Pt stable at time of discharge.   Assessment and Plan   1. Pneumonia affecting pregnancy in second trimester   2. [redacted] weeks gestation of pregnancy   3. Chronic hypertension affecting pregnancy    Discharge home Make appointment to initiate prenatal care- message sent to office for scheduling Return to MAU as needed for reasons discussed and/or emergencies  Rx for amoxicillin, z pack, flexeril and labetalol sent to pharmacy of choice    Follow-up Information    [facility]. Schedule an appointment as soon as possible for a visit.   Specialty: Obstetrics and Gynecology Contact information: [address] [place] [phone]             Allergies as of 04/19/2020   No Known Allergies     Medication List    STOP taking these medications   enalapril 10 MG tablet Commonly known as: VASOTEC   hydrOXYzine 25 MG tablet Commonly known as: ATARAX/VISTARIL   ibuprofen 600 MG tablet Commonly known as: ADVIL     TAKE these medications   acetaminophen 500 MG tablet Commonly known as: TYLENOL Take 1,000 mg by mouth every 6 (six) hours as needed.   amoxicillin 500 MG capsule Commonly known as: AMOXIL Take 2 capsules (1,000 mg total) by mouth 3 (three) times daily.   azithromycin 1 g powder Commonly known as: Zithromax Take 1 packet (1 g total) by mouth daily.   Bayer Low Dose 81 MG EC tablet Generic drug: aspirin Take 81 mg by mouth daily. Swallow whole.   calcium carbonate 500 MG chewable tablet Commonly known as: TUMS - dosed in mg elemental calcium Chew 4 tablets by mouth as needed for indigestion or heartburn.   cyclobenzaprine 10 MG tablet Commonly known as: FLEXERIL Take 1 tablet (10 mg total) by mouth 3 (three) times  daily as needed for muscle spasms.   docusate sodium 100 MG capsule Commonly known as: COLACE Take 1 capsule (100 mg total) by mouth 2 (two) times daily as needed for mild constipation.   ferrous gluconate 324 MG tablet Commonly known as: FERGON Take 1 tablet (324 mg total) by mouth daily with breakfast.   labetalol 200 MG tablet Commonly known as: NORMODYNE Take 1 tablet (200 mg total) by mouth 2 (two) times daily.      April 12, CNM 04/19/20, 1:40 AM

## 2020-04-18 NOTE — ED Triage Notes (Signed)
Emergency Medicine Provider Triage Evaluation Note  Shannon Gonzales , a 34 y.o. female  was evaluated in triage.  Pt complains of inability to feel baby move for the past day. G8P5; pt is currently [redacted] weeks pregnant. Reports that yesterday she began having dizziness and a headache as well as a "numbness" to her entire body and blurry vision. She reports that she has been feeling her baby move however stopped yesterday which concerned her. She continues to feel like she is going to pass out prompting her to come to the ED today.   Review of Systems  Positive: + dizziness/lightheadedness, + HA, + blurry vision Negative: - leg swelling, - vaginal discharge/leakage of fluids, - vaginal bleeding  Physical Exam  BP (!) 185/135 (BP Location: Right Arm)   Pulse (!) 110   Temp 98.2 F (36.8 C)   Resp 18  Gen:   Awake, no distress   HEENT:  Atraumatic  Resp:  Normal effort  Cardiac:  Normal rate  Abd:   Nondistended, + diffusely tender to lower abdomen MSK:   Moves extremities without difficulty, no edema Neuro:  Speech clear   Medical Decision Making  Medically screening exam initiated at 6:29 PM.  Appropriate orders placed.  Shannon Gonzales was informed that the remainder of the evaluation will be completed by another provider, this initial triage assessment does not replace that evaluation, and the importance of remaining in the ED until their evaluation is complete.  Clinical Impression  Pt has been medically screened. She is [redacted] weeks pregnant and having complaints of HA, blurry vision, dizziness, and not feeling her baby move. On arrival to the ED BP is noted to be significantly elevated at 185/1353 and HR 110. In the setting of high blood pressure and headache there is concern for preeclampsia. Have spoken to [facility] midwife at MAU who agrees to accept patient. Pt transferred to MAU.    Shannon Rockers, PA-C 04/18/20 1831

## 2020-04-18 NOTE — MAU Note (Addendum)
.   Shannon Gonzales is a 34 y.o. here in MAU reporting: dizziness, weakness, and headache x2 days. She also reports chest pain. Patient is supposed to be taking BP medication at home but does not take it. Reports she is about [redacted] weeks pregnant with no prenatal care. She states that she passed out yesterday while she was cooking breakfast. No VB or LOF. Reports decreased fetal movement as well.   Pain score: 8 Vitals:   04/18/20 1824 04/18/20 1930  BP: (!) 185/135 (!) 191/137  Pulse: (!) 110 (!) 114  Resp: 18 15  Temp: 98.2 F (36.8 C) 98.2 F (36.8 C)  SpO2:  100%     FHT:169

## 2020-04-19 ENCOUNTER — Telehealth: Payer: Self-pay | Admitting: Family Medicine

## 2020-04-19 DIAGNOSIS — O10919 Unspecified pre-existing hypertension complicating pregnancy, unspecified trimester: Secondary | ICD-10-CM

## 2020-04-19 DIAGNOSIS — O99512 Diseases of the respiratory system complicating pregnancy, second trimester: Secondary | ICD-10-CM

## 2020-04-19 DIAGNOSIS — J189 Pneumonia, unspecified organism: Secondary | ICD-10-CM

## 2020-04-19 DIAGNOSIS — Z3A19 19 weeks gestation of pregnancy: Secondary | ICD-10-CM

## 2020-04-19 MED ORDER — AMOXICILLIN 500 MG PO CAPS: 1000.0000 mg | ORAL_CAPSULE | Freq: Three times a day (TID) | ORAL | 0 refills | Status: DC

## 2020-04-19 MED ORDER — CYCLOBENZAPRINE HCL 10 MG PO TABS: 10.0000 mg | ORAL_TABLET | Freq: Three times a day (TID) | ORAL | 0 refills | Status: DC | PRN

## 2020-04-19 MED ORDER — AZITHROMYCIN 1 G PO PACK: 1.0000 g | PACK | Freq: Every day | ORAL | 0 refills | Status: DC

## 2020-04-19 MED ORDER — LABETALOL HCL 200 MG PO TABS
200.0000 mg | ORAL_TABLET | Freq: Two times a day (BID) | ORAL | 2 refills | Status: DC
Start: 2020-04-19 — End: 2020-05-14

## 2020-04-19 NOTE — Telephone Encounter (Signed)
Pharmacy state the RX for Zithromax is not covered by her insurance, want to know if something else can be called in  [facility] [phone]

## 2020-04-21 MED ORDER — AZITHROMYCIN 250 MG PO TABS: ORAL_TABLET | ORAL | 0 refills | Status: DC

## 2020-04-21 MED ORDER — AMOXICILLIN 500 MG PO CAPS: 1000.0000 mg | ORAL_CAPSULE | Freq: Three times a day (TID) | ORAL | 0 refills | Status: DC

## 2020-04-21 NOTE — Telephone Encounter (Signed)
Pt's prescriptions were discussed with Dr. Macon Large and updates e-prescribed to [facility]. Zithromax changed to Z-pak and 3 additional capsules of Amoxicillin were also prescribed. I called [facility] and discussed these changes with the pharmacist.

## 2020-04-28 ENCOUNTER — Encounter: Payer: Medicaid Other | Admitting: Family Medicine

## 2020-04-29 ENCOUNTER — Encounter: Payer: Self-pay | Admitting: Family Medicine

## 2020-04-29 NOTE — Progress Notes (Signed)
Patient did not keep appointment today. She will be called to reschedule.  

## 2020-05-13 ENCOUNTER — Inpatient Hospital Stay (HOSPITAL_BASED_OUTPATIENT_CLINIC_OR_DEPARTMENT_OTHER): Payer: Medicaid Other

## 2020-05-13 ENCOUNTER — Other Ambulatory Visit: Payer: Self-pay

## 2020-05-13 ENCOUNTER — Inpatient Hospital Stay (HOSPITAL_COMMUNITY)
Admission: AD | Admit: 2020-05-13 | Discharge: 2020-05-14 | DRG: 806 | Disposition: A | Discharge status: Home / Self Care | Payer: Medicaid Other | Attending: Obstetrics & Gynecology | Admitting: Obstetrics & Gynecology

## 2020-05-13 ENCOUNTER — Encounter (HOSPITAL_COMMUNITY): Payer: Self-pay | Admitting: Obstetrics & Gynecology

## 2020-05-13 DIAGNOSIS — F1490 Cocaine use, unspecified, uncomplicated: Secondary | ICD-10-CM

## 2020-05-13 DIAGNOSIS — O09212 Supervision of pregnancy with history of pre-term labor, second trimester: Secondary | ICD-10-CM

## 2020-05-13 DIAGNOSIS — F419 Anxiety disorder, unspecified: Secondary | ICD-10-CM | POA: Diagnosis not present

## 2020-05-13 DIAGNOSIS — O99345 Other mental disorders complicating the puerperium: Secondary | ICD-10-CM | POA: Diagnosis not present

## 2020-05-13 DIAGNOSIS — O364XX0 Maternal care for intrauterine death, not applicable or unspecified: Secondary | ICD-10-CM

## 2020-05-13 DIAGNOSIS — O1002 Pre-existing essential hypertension complicating childbirth: Secondary | ICD-10-CM | POA: Diagnosis present

## 2020-05-13 DIAGNOSIS — O99324 Drug use complicating childbirth: Secondary | ICD-10-CM | POA: Diagnosis present

## 2020-05-13 DIAGNOSIS — O4702 False labor before 37 completed weeks of gestation, second trimester: Secondary | ICD-10-CM | POA: Diagnosis not present

## 2020-05-13 DIAGNOSIS — Z363 Encounter for antenatal screening for malformations: Secondary | ICD-10-CM

## 2020-05-13 DIAGNOSIS — O4590 Premature separation of placenta, unspecified, unspecified trimester: Secondary | ICD-10-CM | POA: Diagnosis present

## 2020-05-13 DIAGNOSIS — O0990 Supervision of high risk pregnancy, unspecified, unspecified trimester: Secondary | ICD-10-CM

## 2020-05-13 DIAGNOSIS — O34219 Maternal care for unspecified type scar from previous cesarean delivery: Secondary | ICD-10-CM | POA: Diagnosis present

## 2020-05-13 DIAGNOSIS — O321XX0 Maternal care for breech presentation, not applicable or unspecified: Secondary | ICD-10-CM | POA: Diagnosis present

## 2020-05-13 DIAGNOSIS — Z3A23 23 weeks gestation of pregnancy: Secondary | ICD-10-CM

## 2020-05-13 DIAGNOSIS — Z8759 Personal history of other complications of pregnancy, childbirth and the puerperium: Secondary | ICD-10-CM | POA: Diagnosis not present

## 2020-05-13 DIAGNOSIS — O09292 Supervision of pregnancy with other poor reproductive or obstetric history, second trimester: Secondary | ICD-10-CM | POA: Diagnosis not present

## 2020-05-13 DIAGNOSIS — F1410 Cocaine abuse, uncomplicated: Secondary | ICD-10-CM | POA: Diagnosis present

## 2020-05-13 DIAGNOSIS — O368390 Maternal care for abnormalities of the fetal heart rate or rhythm, unspecified trimester, not applicable or unspecified: Secondary | ICD-10-CM

## 2020-05-13 DIAGNOSIS — Z87891 Personal history of nicotine dependence: Secondary | ICD-10-CM

## 2020-05-13 DIAGNOSIS — O10013 Pre-existing essential hypertension complicating pregnancy, third trimester: Secondary | ICD-10-CM | POA: Diagnosis present

## 2020-05-13 DIAGNOSIS — O09892 Supervision of other high risk pregnancies, second trimester: Secondary | ICD-10-CM

## 2020-05-13 DIAGNOSIS — O4692 Antepartum hemorrhage, unspecified, second trimester: Secondary | ICD-10-CM

## 2020-05-13 DIAGNOSIS — O34211 Maternal care for low transverse scar from previous cesarean delivery: Secondary | ICD-10-CM

## 2020-05-13 DIAGNOSIS — O0932 Supervision of pregnancy with insufficient antenatal care, second trimester: Secondary | ICD-10-CM | POA: Diagnosis not present

## 2020-05-13 DIAGNOSIS — O4592 Premature separation of placenta, unspecified, second trimester: Secondary | ICD-10-CM | POA: Diagnosis present

## 2020-05-13 DIAGNOSIS — Z8616 Personal history of COVID-19: Secondary | ICD-10-CM

## 2020-05-13 DIAGNOSIS — O10012 Pre-existing essential hypertension complicating pregnancy, second trimester: Secondary | ICD-10-CM | POA: Diagnosis not present

## 2020-05-13 DIAGNOSIS — F1210 Cannabis abuse, uncomplicated: Secondary | ICD-10-CM | POA: Diagnosis present

## 2020-05-13 DIAGNOSIS — Z98891 History of uterine scar from previous surgery: Secondary | ICD-10-CM

## 2020-05-13 DIAGNOSIS — O1093 Unspecified pre-existing hypertension complicating the puerperium: Secondary | ICD-10-CM | POA: Diagnosis not present

## 2020-05-13 LAB — CBC
HCT: 32.2 % — ABNORMAL LOW (ref 36.0–46.0)
Hemoglobin: 10.2 g/dL — ABNORMAL LOW (ref 12.0–15.0)
MCH: 29.7 pg (ref 26.0–34.0)
MCHC: 31.7 g/dL (ref 30.0–36.0)
MCV: 93.6 fL (ref 80.0–100.0)
Platelets: 305 10*3/uL (ref 150–400)
RBC: 3.44 MIL/uL — ABNORMAL LOW (ref 3.87–5.11)
RDW: 15.9 % — ABNORMAL HIGH (ref 11.5–15.5)
WBC: 12.2 10*3/uL — ABNORMAL HIGH (ref 4.0–10.5)
nRBC: 0 % (ref 0.0–0.2)

## 2020-05-13 LAB — TYPE AND SCREEN
ABO/RH(D): B POS
Antibody Screen: NEGATIVE

## 2020-05-13 LAB — COMPREHENSIVE METABOLIC PANEL
ALT: 15 U/L (ref 0–44)
AST: 33 U/L (ref 15–41)
Albumin: 2.7 g/dL — ABNORMAL LOW (ref 3.5–5.0)
Alkaline Phosphatase: 68 U/L (ref 38–126)
Anion gap: 9 (ref 5–15)
BUN: 10 mg/dL (ref 6–20)
CO2: 23 mmol/L (ref 22–32)
Calcium: 9.2 mg/dL (ref 8.9–10.3)
Chloride: 105 mmol/L (ref 98–111)
Creatinine, Ser: 0.6 mg/dL (ref 0.44–1.00)
GFR, Estimated: >60 mL/min (ref >60)
Glucose, Bld: 84 mg/dL (ref 70–99)
Potassium: 4 mmol/L (ref 3.5–5.1)
Sodium: 137 mmol/L (ref 135–145)
Total Bilirubin: 0.3 mg/dL (ref 0.3–1.2)
Total Protein: 6.3 g/dL — ABNORMAL LOW (ref 6.5–8.1)

## 2020-05-13 LAB — GROUP B STREP BY PCR: Group B strep by PCR: NEGATIVE

## 2020-05-13 LAB — PROTEIN / CREATININE RATIO, URINE
Creatinine, Urine: 275.58 mg/dL
Protein Creatinine Ratio: 0.15 mg/mg{Cre} (ref 0.00–0.15)
Total Protein, Urine: 41 mg/dL

## 2020-05-13 LAB — RAPID URINE DRUG SCREEN, HOSP PERFORMED
Amphetamines: NOT DETECTED
Barbiturates: NOT DETECTED
Benzodiazepines: NOT DETECTED
Cocaine: POSITIVE — AB
Opiates: NOT DETECTED
Tetrahydrocannabinol: POSITIVE — AB

## 2020-05-13 LAB — WET PREP, GENITAL
Clue Cells Wet Prep HPF POC: NONE SEEN
Sperm: NONE SEEN
Trich, Wet Prep: NONE SEEN
WBC, Wet Prep HPF POC: NONE SEEN
Yeast Wet Prep HPF POC: NONE SEEN

## 2020-05-13 LAB — RPR: RPR Ser Ql: NONREACTIVE

## 2020-05-13 LAB — GC/CHLAMYDIA PROBE AMP (~~LOC~~) NOT AT ARMC
Chlamydia: NEGATIVE
Comment: NEGATIVE
Comment: NORMAL
Neisseria Gonorrhea: NEGATIVE

## 2020-05-13 LAB — POCT FERN TEST: POCT Fern Test: NEGATIVE

## 2020-05-13 MED ORDER — MAGNESIUM SULFATE 40 GM/1000ML IV SOLN
2.0000 g/h | INTRAVENOUS | Status: DC
  Administered 2020-05-13: 2 g/h via INTRAVENOUS
  Filled 2020-05-13: qty 1000

## 2020-05-13 MED ORDER — COCONUT OIL OIL: 1.0000 "application " | TOPICAL_OIL | Status: DC | PRN

## 2020-05-13 MED ORDER — LACTATED RINGERS IV SOLN: INTRAVENOUS | Status: DC

## 2020-05-13 MED ORDER — SENNOSIDES-DOCUSATE SODIUM 8.6-50 MG PO TABS
2.0000 | ORAL_TABLET | ORAL | Status: DC
  Administered 2020-05-13: 2 via ORAL
  Filled 2020-05-13: qty 2

## 2020-05-13 MED ORDER — LIDOCAINE HCL (PF) 1 % IJ SOLN: 30.0000 mL | INTRAMUSCULAR | Status: DC | PRN

## 2020-05-13 MED ORDER — FENTANYL CITRATE (PF) 100 MCG/2ML IJ SOLN
100.0000 ug | INTRAMUSCULAR | Status: DC | PRN
  Administered 2020-05-13 (×2): 100 ug via INTRAVENOUS
  Filled 2020-05-13 (×3): qty 2

## 2020-05-13 MED ORDER — OXYTOCIN BOLUS FROM INFUSION
333.0000 mL | Freq: Once | INTRAVENOUS | Status: AC
  Administered 2020-05-13: 333 mL via INTRAVENOUS

## 2020-05-13 MED ORDER — NIFEDIPINE ER OSMOTIC RELEASE 30 MG PO TB24
30.0000 mg | ORAL_TABLET | Freq: Two times a day (BID) | ORAL | Status: DC
  Administered 2020-05-13 (×2): 30 mg via ORAL
  Filled 2020-05-13 (×2): qty 1

## 2020-05-13 MED ORDER — DIBUCAINE (PERIANAL) 1 % EX OINT: 1.0000 "application " | TOPICAL_OINTMENT | CUTANEOUS | Status: DC | PRN

## 2020-05-13 MED ORDER — WITCH HAZEL-GLYCERIN EX PADS: 1.0000 "application " | MEDICATED_PAD | CUTANEOUS | Status: DC | PRN

## 2020-05-13 MED ORDER — IBUPROFEN 600 MG PO TABS
600.0000 mg | ORAL_TABLET | Freq: Four times a day (QID) | ORAL | Status: DC
  Administered 2020-05-13 – 2020-05-14 (×4): 600 mg via ORAL
  Filled 2020-05-13 (×4): qty 1

## 2020-05-13 MED ORDER — TETANUS-DIPHTH-ACELL PERTUSSIS 5-2.5-18.5 LF-MCG/0.5 IM SUSY: 0.5000 mL | PREFILLED_SYRINGE | Freq: Once | INTRAMUSCULAR | Status: DC

## 2020-05-13 MED ORDER — OXYCODONE-ACETAMINOPHEN 5-325 MG PO TABS
1.0000 | ORAL_TABLET | ORAL | Status: DC | PRN
  Administered 2020-05-13 – 2020-05-14 (×2): 1 via ORAL
  Filled 2020-05-13 (×2): qty 1

## 2020-05-13 MED ORDER — SODIUM CHLORIDE 0.9 % IV SOLN: 5.0000 10*6.[IU] | Freq: Once | INTRAVENOUS | Status: DC

## 2020-05-13 MED ORDER — BENZOCAINE-MENTHOL 20-0.5 % EX AERO
1.0000 "application " | INHALATION_SPRAY | CUTANEOUS | Status: DC | PRN
  Administered 2020-05-13: 1 via TOPICAL
  Filled 2020-05-13: qty 56

## 2020-05-13 MED ORDER — PRENATAL MULTIVITAMIN CH
1.0000 | ORAL_TABLET | Freq: Every day | ORAL | Status: DC
  Administered 2020-05-14: 1 via ORAL
  Filled 2020-05-13: qty 1

## 2020-05-13 MED ORDER — BETAMETHASONE SOD PHOS & ACET 6 (3-3) MG/ML IJ SUSP
12.0000 mg | INTRAMUSCULAR | Status: DC
  Filled 2020-05-13: qty 5

## 2020-05-13 MED ORDER — ONDANSETRON HCL 4 MG/2ML IJ SOLN: 4.0000 mg | Freq: Four times a day (QID) | INTRAMUSCULAR | Status: DC | PRN

## 2020-05-13 MED ORDER — OXYCODONE-ACETAMINOPHEN 5-325 MG PO TABS
2.0000 | ORAL_TABLET | ORAL | Status: DC | PRN
  Administered 2020-05-13 – 2020-05-14 (×4): 2 via ORAL
  Filled 2020-05-13 (×5): qty 2

## 2020-05-13 MED ORDER — ONDANSETRON HCL 4 MG PO TABS: 4.0000 mg | ORAL_TABLET | ORAL | Status: DC | PRN

## 2020-05-13 MED ORDER — DIPHENHYDRAMINE HCL 25 MG PO CAPS: 25.0000 mg | ORAL_CAPSULE | Freq: Four times a day (QID) | ORAL | Status: DC | PRN

## 2020-05-13 MED ORDER — OXYTOCIN-SODIUM CHLORIDE 30-0.9 UT/500ML-% IV SOLN: 2.5000 [IU]/h | INTRAVENOUS | Status: DC

## 2020-05-13 MED ORDER — OXYTOCIN-SODIUM CHLORIDE 30-0.9 UT/500ML-% IV SOLN
INTRAVENOUS | Status: AC
  Filled 2020-05-13: qty 500

## 2020-05-13 MED ORDER — ONDANSETRON HCL 4 MG/2ML IJ SOLN: 4.0000 mg | INTRAMUSCULAR | Status: DC | PRN

## 2020-05-13 MED ORDER — SOD CITRATE-CITRIC ACID 500-334 MG/5ML PO SOLN: 30.0000 mL | ORAL | Status: DC | PRN

## 2020-05-13 MED ORDER — MAGNESIUM SULFATE BOLUS VIA INFUSION
4.0000 g | Freq: Once | INTRAVENOUS | Status: AC
  Administered 2020-05-13: 4 g via INTRAVENOUS
  Filled 2020-05-13: qty 1000

## 2020-05-13 MED ORDER — ACETAMINOPHEN 325 MG PO TABS
650.0000 mg | ORAL_TABLET | Freq: Four times a day (QID) | ORAL | Status: DC
  Administered 2020-05-13: 650 mg via ORAL
  Filled 2020-05-13 (×3): qty 2

## 2020-05-13 MED ORDER — PENICILLIN G POT IN DEXTROSE 60000 UNIT/ML IV SOLN: 3.0000 10*6.[IU] | INTRAVENOUS | Status: DC

## 2020-05-13 MED ORDER — SIMETHICONE 80 MG PO CHEW: 80.0000 mg | CHEWABLE_TABLET | ORAL | Status: DC | PRN

## 2020-05-13 MED ORDER — LACTATED RINGERS IV SOLN: 500.0000 mL | INTRAVENOUS | Status: DC | PRN

## 2020-05-13 MED ORDER — ACETAMINOPHEN 325 MG PO TABS: 650.0000 mg | ORAL_TABLET | ORAL | Status: DC | PRN

## 2020-05-13 NOTE — H&P (Signed)
OBSTETRIC ADMISSION HISTORY AND PHYSICAL  Shannon Gonzales is a 34 y.o. female [phone] with IUP at [redacted]w[redacted]d by 15 week ultrasound presenting for concern of severe abdominal pain, vaginal bleeding and contractions. Specifically, pt reports that vaginal bleeding started yesterday but she was reassured given that "baby was still moving". Several hours prior to presenting to the MAU pt had onset of severe right-sided abdominal pain.  On arrival she reported no LOF, no blurry vision, headaches or peripheral edema, and RUQ pain.   She did not receive prenatal care this pregnancy except for a limited ultrasound at   Dating: By 15 week ultrasound at outside clinic (per pt report) --->  Estimated Date of Delivery: 09/07/20  Sono: @[redacted]w[redacted]d  (ultrasound in MAU prior to current admission), footling breech with transverse lie, 630g, 81% EFW  Prenatal History/Complications:  - cocaine & marijuana use in current pregnancy (last use 1.5 weeks ago) - COVID infection in pregnancy (diagnosed 8/24) - h/o multiple preterm deliveries - h/o Cesarean x1 with multiple VBACs - cHTN with history of superimposed severe Preeclampsia vs atypical HELLP in prior pregnancy - h/o placental abruption in prior pregnancy - short interval pregnancy  Past Medical History: Past Medical History:  Diagnosis Date  . Anxiety   . Depression   . Drug abuse, marijuana    with pregnancy  . Hx of PTL (preterm labor), current pregnancy   . Hypertension   . Late prenatal care   . Trichimoniasis    with pregnancy    Past Surgical History: Past Surgical History:  Procedure Laterality Date  . CESAREAN SECTION      Obstetrical History: OB History    Gravida  10   Para  6   Term  1   Preterm  5   AB  3   Living  5     SAB  2   TAB  1   Ectopic      Multiple  0   Live Births  2           Social History Social History   Socioeconomic History  . Marital status: Single    Spouse name: Not on file  . Number  of children: Not on file  . Years of education: Not on file  . Highest education level: Not on file  Occupational History  . Not on file  Tobacco Use  . Smoking status: Former Smoker    Packs/day: 0.10    Types: Cigarettes  . Smokeless tobacco: Never Used  Vaping Use  . Vaping Use: Never used  Substance and Sexual Activity  . Alcohol use: No  . Drug use: Yes    Types: Hashish, Marijuana  . Sexual activity: Yes    Birth control/protection: None  Other Topics Concern  . Not on file  Social History Narrative  . Not on file   Social Determinants of Health   Financial Resource Strain:   . Difficulty of Paying Living Expenses: Not on file  Food Insecurity:   . Worried About 9/24 in the Last Year: Not on file  . Ran Out of Food in the Last Year: Not on file  Transportation Needs:   . Lack of Transportation (Medical): Not on file  . Lack of Transportation (Non-Medical): Not on file  Physical Activity:   . Days of Exercise per Week: Not on file  . Minutes of Exercise per Session: Not on file  Stress:   . Feeling of Stress : Not  on file  Social Connections:   . Frequency of Communication with Friends and Family: Not on file  . Frequency of Social Gatherings with Friends and Family: Not on file  . Attends Religious Services: Not on file  . Active Member of Clubs or Organizations: Not on file  . Attends Banker Meetings: Not on file  . Marital Status: Not on file    Family History: Family History  Problem Relation Age of Onset  . Hypertension Mother     Allergies: No Known Allergies  Medications Prior to Admission  Medication Sig Dispense Refill Last Dose  . acetaminophen (TYLENOL) 500 MG tablet Take 1,000 mg by mouth every 6 (six) hours as needed.     Marland Kitchen amoxicillin (AMOXIL) 500 MG capsule Take 2 capsules (1,000 mg total) by mouth 3 (three) times daily. 30 capsule 0   . aspirin (BAYER LOW DOSE) 81 MG EC tablet Take 81 mg by mouth daily.  Swallow whole.     Marland Kitchen azithromycin (ZITHROMAX) 250 MG tablet Take 2 tablets by mouth once, then take 1 tablet daily for 4 days 6 tablet 0   . calcium carbonate (TUMS - DOSED IN MG ELEMENTAL CALCIUM) 500 MG chewable tablet Chew 4 tablets by mouth as needed for indigestion or heartburn. (Patient not taking: Reported on 03/02/2020)     . cyclobenzaprine (FLEXERIL) 10 MG tablet Take 1 tablet (10 mg total) by mouth 3 (three) times daily as needed for muscle spasms. 20 tablet 0   . docusate sodium (COLACE) 100 MG capsule Take 1 capsule (100 mg total) by mouth 2 (two) times daily as needed for mild constipation. (Patient not taking: Reported on 03/02/2020) 60 capsule 0   . ferrous gluconate (FERGON) 324 MG tablet Take 1 tablet (324 mg total) by mouth daily with breakfast. (Patient not taking: Reported on 03/02/2020) 60 tablet 0   . labetalol (NORMODYNE) 200 MG tablet Take 1 tablet (200 mg total) by mouth 2 (two) times daily. 60 tablet 2      Review of Systems   All systems reviewed and negative except as stated in HPI  not currently breastfeeding. General appearance: alert, cooperative and appears stated age Lungs: normal WOB Heart: regular rate  Abdomen: soft, diffuse tenderness to palpation, gravid Extremities: no sign of DVT Presentation: breech on formal ultrasound Fetal monitoring: unable to obtain NST given difficulty with external monitoring; intermittently FHTs in 120s-130s on doppler; decels to 80s with contractions Uterine activity: approximately every 3-5 minutes  Prenatal labs: ABO, Rh: --/--/B POS, B POS Performed at [facility], [address] [place], [place] [address]  [phone]) Antibody: NEG (01/10 0505) Rubella: 6.25 (01/10 0545) RPR: NON REACTIVE (01/10 0506)  HBsAg: NON REACTIVE (01/10 0545)  HIV: NON REACTIVE (01/10 0545)  GBS:   unknown (collected on admission) 1 hr Glucola: not performed Genetic screening: not performed Anatomy US: not performed  Prenatal  Transfer Tool  Maternal Diabetes: No (no antenatal testing performed) Genetic Screening: Not performed Maternal Ultrasounds/Referrals: per pt, 15 week ultrasound at outside clinic (no records in Epic) Fetal Ultrasounds or other Referrals:  None (no prenatal care) Maternal Substance Abuse:  Yes:  Type: Marijuana, Cocaine (last use reported 1.5 weeks ago) Significant Maternal Medications:  Meds include: Other: labetalol, amoxicillin, azithromycin Significant Maternal Lab Results: Other: GBS unknown  No results found for this or any previous visit (from the past 24 hour(s)).  Patient Active Problem List   Diagnosis Date Noted  . Elevated  liver enzymes 07/22/2019  . IUFD at 20 weeks or more of gestation 07/20/2019  . Placental abruption 07/20/2019  . Chronic hypertension with superimposed preeclampsia 06/07/2018  . Preterm labor in third trimester 06/05/2018  . Hypertension in pregnancy, essential, antepartum, third trimester 03/28/2018  . History of preterm delivery, currently pregnant in second trimester 03/28/2018  . Supervision of high risk pregnancy, antepartum, second trimester 03/28/2018  . No prenatal care in current pregnancy 03/28/2018  . History of VBAC 03/28/2018  . Anxiety 03/28/2018  . Smoker 03/28/2018  . Marijuana use 03/28/2018    Assessment/Plan:  Shannon Gonzales is a 33 y.o. [id number] at [redacted]w[redacted]d, admitted for preterm labor.  #Preterm Labor  C/f Placental Abruption: Pt found to be completely dilated on arrival with membranes intact (negative pooling, negative ferning). Given several days of bleeding with severe abdominal pain, also concern for placental abruption. NICU consulted immediately on arrival and discussed likely fetal outcomes with pt in MAU. Author also discussed pt with Dr. Despina Hidden on arrival. Magnesium started immediately in MAU for tocolysis and neuro-protection. Betamethasone and ampicillin also ordered on admission. Will manage expectantly. Given pt's desire for  full intervention despite peri-viable GA, will have NICU present at time of delivery. #Pain: unable to administer epidural given complete cervical dilation on arrival #FWB: Unable to obtain reliable FHT with external monitors. Intermittently FHTs in 120s-130s but decels to 80s with contractions. #ID: GBS unknown (swabs collected on admission). Ampicillin ordered on arrival. #MOF: TBD #MOC: TBD #No Prenatal Care  Marijuana & Cocaine Use in Pregnancy: UDS on admission. Plan for SW in postpartum period and 1 week mood check. #cHTN  H/o Severe Preeclampsia vs atypical HELLP syndrome: Pt with normal Bps on arrival. No concerning signs/symptoms. F/u Preeclampsia labs on admission. Will continue to monitor BP closely. #Short Interval Pregnancy: will plan to discuss contraception options in postpartum period #H/o Cesarean s/p multiple VBACs: plan to manage expectantly as noted above.  Sheila Oats, MD  05/13/2020, 2:37 AM

## 2020-05-13 NOTE — MAU Provider Note (Signed)
Chief Complaint:  Abdominal Pain   First Provider Initiated Contact with Patient 05/13/20 0224     HPI: Shannon Gonzales is a 34 y.o. [id number] at 45w2dwho presents to maternity admissions reporting severe abdominal pain for the past hour with vaginal bleeding that started yesterday. History of several preterm deliveries.  History of C/S with several VBACs afterward. .history of severe hypertension in pregnancy She reports no fetal movement, denies LOF, vaginal itching/burning, urinary symptoms, h/a, dizziness, n/v, diarrhea, constipation or fever/chills.  She denies headache, visual changes or RUQ abdominal pain.  Abdominal Pain This is a new problem. The current episode started today. The onset quality is sudden. The problem occurs intermittently. The problem has been unchanged. The pain is located in the LLQ, RLQ and suprapubic region. The pain is severe. The quality of the pain is cramping. The abdominal pain does not radiate. Pertinent negatives include no constipation, diarrhea, dysuria, fever, frequency or myalgias. Nothing aggravates the pain. The pain is relieved by nothing. She has tried nothing for the symptoms.     Past Medical History: Past Medical History:  Diagnosis Date  . Anxiety   . Depression   . Drug abuse, marijuana    with pregnancy  . Hx of PTL (preterm labor), current pregnancy   . Hypertension   . Late prenatal care   . Trichimoniasis    with pregnancy    Past obstetric history: OB History  Gravida Para Term Preterm AB Living  10 6 1 5 3 5   SAB TAB Ectopic Multiple Live Births  2 1   0 2    # Outcome Date GA Lbr Len/2nd Weight Sex Delivery Anes PTL Lv  10 Current           9 Preterm 07/20/19 [redacted]w[redacted]d  1145 g M VBAC EPI  FD  8 Preterm 06/05/18 [redacted]w[redacted]d 00:46 / 00:02 1030 g F VBAC None  LIV     Birth Comments: arrived after 1 minute, normal exam except small for dates  7 Preterm 05/07/13 [redacted]w[redacted]d 15:42 / 00:13 2299 g M VBAC   LIV  6 Term 06/29/09 [redacted]w[redacted]d   F VBAC      5 Preterm 03/20/07 [redacted]w[redacted]d   F VBAC  Y   4 Preterm 03/29/06    F CS-LTranv  Y   3 SAB 2007          2 SAB 2006 [redacted]w[redacted]d   M    FD  1 TAB 2003            Past Surgical History: Past Surgical History:  Procedure Laterality Date  . CESAREAN SECTION      Family History: Family History  Problem Relation Age of Onset  . Hypertension Mother     Social History: Social History   Tobacco Use  . Smoking status: Former Smoker    Packs/day: 0.10    Types: Cigarettes  . Smokeless tobacco: Never Used  Vaping Use  . Vaping Use: Never used  Substance Use Topics  . Alcohol use: No  . Drug use: Yes    Types: Hashish, Marijuana    Allergies: No Known Allergies  Meds:  Medications Prior to Admission  Medication Sig Dispense Refill Last Dose  . acetaminophen (TYLENOL) 500 MG tablet Take 1,000 mg by mouth every 6 (six) hours as needed.     2004 amoxicillin (AMOXIL) 500 MG capsule Take 2 capsules (1,000 mg total) by mouth 3 (three) times daily. 30 capsule 0   . aspirin (BAYER  LOW DOSE) 81 MG EC tablet Take 81 mg by mouth daily. Swallow whole.     Marland Kitchen azithromycin (ZITHROMAX) 250 MG tablet Take 2 tablets by mouth once, then take 1 tablet daily for 4 days 6 tablet 0   . calcium carbonate (TUMS - DOSED IN MG ELEMENTAL CALCIUM) 500 MG chewable tablet Chew 4 tablets by mouth as needed for indigestion or heartburn. (Patient not taking: Reported on 03/02/2020)     . cyclobenzaprine (FLEXERIL) 10 MG tablet Take 1 tablet (10 mg total) by mouth 3 (three) times daily as needed for muscle spasms. 20 tablet 0   . docusate sodium (COLACE) 100 MG capsule Take 1 capsule (100 mg total) by mouth 2 (two) times daily as needed for mild constipation. (Patient not taking: Reported on 03/02/2020) 60 capsule 0   . ferrous gluconate (FERGON) 324 MG tablet Take 1 tablet (324 mg total) by mouth daily with breakfast. (Patient not taking: Reported on 03/02/2020) 60 tablet 0   . labetalol (NORMODYNE) 200 MG tablet Take 1 tablet (200  mg total) by mouth 2 (two) times daily. 60 tablet 2     I have reviewed patient's Past Medical Hx, Surgical Hx, Family Hx, Social Hx, medications and allergies.   ROS:  Review of Systems  Constitutional: Negative for fever.  Gastrointestinal: Positive for abdominal pain. Negative for constipation and diarrhea.  Genitourinary: Negative for dysuria and frequency.  Musculoskeletal: Negative for myalgias.   Other systems negative  Physical Exam  There were no vitals filed for this visit. Vitals:   05/13/20 0247 05/13/20 0250 05/13/20 0312 05/13/20 0315  BP: 121/69  126/74   Pulse: 81  86   Resp: 19  19   Temp: 98.5 F (36.9 C)  98.3 F (36.8 C)   TempSrc: Oral  Oral   SpO2:  100%  99%    Constitutional: Well-developed, well-nourished female in no acute distress, but in significant pain. .  Cardiovascular: normal rate and rhythm Respiratory: normal effort, clear to auscultation bilaterally GI: Abd soft, tender, gravid appropriate for gestational age.   No rebound or guarding. MS: Extremities nontender, no edema, normal ROM Neurologic: Alert and oriented x 4.  GU: Neg CVAT.  PELVIC EXAM: Cervix not visible.  There is large area of bulging membranes visible with speculum exam.  Cervix at least 4-5cm.  No pooling, minimal bleeding.  Digital exam deferred.  [place] ordered   FHT: Unable to auscultate fetal heart tones   Labs:  --/--/B POS, B POS Performed at [facility], [address] [place], [place] [address]  [phone])  Imaging:  Bedside US done:  No visible cervix.  Transverse back up lie with feet in cervix.  FHR drops to nearly 0 with every contraction while sonographer was watching.    MAU Course/MDM: I have ordered labs and reviewed results.  Cultures obtained, wet prep and GBS culture and PCR.  Slide for ferning: Will start IVfluids and Magnesium sulfate infusion Betamethasone series.  Unable to obtain NST [place] called to bedside for exam.  Dr Barb Merino will  come and assume care  Consult Dr Despina Hidden with presentation, exam findings and test results.   WIll admit for delivery NICU consulted  Assessment: [place] IUP at [redacted]w[redacted]d Preterm labor with complete dilation of cervix Fetal heart rate decelerations, severe History of C/S with subsequent VBACs History preeclampsia   Plan: Care turned over to Dr Barb Merino Transfer to Labor and Delivery Anticipate Preterm delivery Labor team to follow  Wynelle Bourgeois CNM, MSN Certified Nurse-Midwife 05/13/2020 2:24 AM

## 2020-05-13 NOTE — Social Work (Signed)
CSW received consult due to fetal demise.  CSW contacted [facility]. CSW available for support secondary to [facility] and will await call from Chaplain before becoming involved.  CSW screening out referral at this time.  Manfred Arch, LCSWA Clinical Social Work [employer] and [employer] [phone]

## 2020-05-13 NOTE — MAU Note (Addendum)
Spotting since yesterday but no pain. Now having abd pain. No PNC. Found out she was pregnant at [redacted]wks by u/s at [facility]. Does not know LMP. Pt states her EDC is 09/09/20

## 2020-05-14 ENCOUNTER — Encounter (HOSPITAL_COMMUNITY): Payer: Self-pay | Admitting: Obstetrics & Gynecology

## 2020-05-14 DIAGNOSIS — O99345 Other mental disorders complicating the puerperium: Secondary | ICD-10-CM

## 2020-05-14 DIAGNOSIS — O1093 Unspecified pre-existing hypertension complicating the puerperium: Secondary | ICD-10-CM

## 2020-05-14 DIAGNOSIS — Z8759 Personal history of other complications of pregnancy, childbirth and the puerperium: Secondary | ICD-10-CM

## 2020-05-14 DIAGNOSIS — F419 Anxiety disorder, unspecified: Secondary | ICD-10-CM

## 2020-05-14 LAB — CULTURE, OB URINE: Culture: NO GROWTH

## 2020-05-14 LAB — SURGICAL PATHOLOGY

## 2020-05-14 MED ORDER — ENALAPRIL MALEATE 5 MG PO TABS
10.0000 mg | ORAL_TABLET | Freq: Every day | ORAL | Status: DC
  Administered 2020-05-14: 10 mg via ORAL
  Filled 2020-05-14: qty 2

## 2020-05-14 MED ORDER — ZOLPIDEM TARTRATE 5 MG PO TABS: 5.0000 mg | ORAL_TABLET | Freq: Every evening | ORAL | 1 refills | Status: DC | PRN

## 2020-05-14 MED ORDER — MEDROXYPROGESTERONE ACETATE 150 MG/ML IM SUSP
150.0000 mg | Freq: Once | INTRAMUSCULAR | Status: AC
  Administered 2020-05-14: 150 mg via INTRAMUSCULAR
  Filled 2020-05-14: qty 1

## 2020-05-14 MED ORDER — ENALAPRIL MALEATE 10 MG PO TABS
10.0000 mg | ORAL_TABLET | Freq: Every day | ORAL | 3 refills | Status: DC
Start: 2020-05-15 — End: 2022-12-21

## 2020-05-14 MED ORDER — NIFEDIPINE ER OSMOTIC RELEASE 30 MG PO TB24
60.0000 mg | ORAL_TABLET | Freq: Two times a day (BID) | ORAL | Status: DC
  Administered 2020-05-14: 60 mg via ORAL
  Filled 2020-05-14: qty 2

## 2020-05-14 MED ORDER — KETOROLAC TROMETHAMINE 30 MG/ML IJ SOLN
30.0000 mg | Freq: Once | INTRAMUSCULAR | Status: AC
  Administered 2020-05-14: 30 mg via INTRAVENOUS
  Filled 2020-05-14: qty 1

## 2020-05-14 MED ORDER — IBUPROFEN 600 MG PO TABS
600.0000 mg | ORAL_TABLET | Freq: Four times a day (QID) | ORAL | 3 refills | Status: DC
Start: 2020-05-14 — End: 2020-09-29

## 2020-05-14 MED ORDER — NIFEDIPINE ER 60 MG PO TB24
60.0000 mg | ORAL_TABLET | Freq: Two times a day (BID) | ORAL | 3 refills | Status: DC
Start: 2020-05-14 — End: 2022-12-21

## 2020-05-14 MED FILL — ENALAPRIL MALEATE 10 MG TAB: 10 | 30 days supply | Qty: 30 | Fill #0

## 2020-05-14 MED FILL — IBUPROFEN 600 MG TABLET: 600 | 7 days supply | Qty: 30 | Fill #0

## 2020-05-14 MED FILL — NIFEdipine ER 60 MG TB24: 60 | 30 days supply | Qty: 60 | Fill #0

## 2020-05-14 MED FILL — ZOLPIDEM TARTRATE 5 MG TAB: 5 | 15 days supply | Qty: 15 | Fill #0

## 2020-05-14 NOTE — Progress Notes (Signed)
This is a chart entry reflecting two visits with pt.  I met Shannon Gonzales on 11/4 and offered initial grief support to her.  Overall, she stated that she is doing fine. She shared about her loss in January and how she had guarded herself with this pregnancy to not get too attached to this baby since she knew that a pregnancy so closely following a loss may not have a good outcome. She shared about her older daughters and I let her know about [employer] if her daughters need some additional support in processing this. She plans to use [facility] for cremation.  She has good support from her work and they encouraged her to take time to heal as well.  She is aware of ongoing availability for support.  [place], [place] Pager, [phone] 2:10 PM

## 2020-05-14 NOTE — Discharge Summary (Signed)
Postpartum Discharge Summary     Patient Name: Shannon Gonzales DOB: Sep 08, 1985 MRN: [id number]  Date of admission: 05/13/2020 Delivery date:05/13/2020  Delivering provider: Sheila Oats  Date of discharge: 05/14/2020  Admitting diagnosis: Supervision of high-risk pregnancy [O09.90] Intrauterine pregnancy: [redacted]w[redacted]d     Secondary diagnosis:  Principal Problem:   Fetal demise > 22 weeks, delivered, current hospitalization Active Problems:   Hypertension in pregnancy, essential, antepartum, third trimester   History of preterm delivery, currently pregnant in second trimester   History of VBAC   Anxiety   Placental abruption   Supervision of high-risk pregnancy   Vaginal delivery  Additional problems: as noted above   Discharge diagnosis: Vaginal delivery with fetal demise s/p NICU resuscitation                                         Augmentation: none  Hospital course: Onset of Labor With Vaginal Delivery      34 y.o. yo [id number] at [redacted]w[redacted]d was admitted in Active Labor on 05/13/2020. Pt was noted to have complete cervical dilation in MAU on arrival. NICU was consulted and discussed options with pt in the MAU. Pt elected for full resuscitation. Membrane Rupture Time/Date: 3:39 AM ,05/13/2020   Delivery Method:VBAC, Spontaneous  Episiotomy: None  Lacerations:  None   NICU started resuscitation immediately s/p delivery. At 17 minutes of life resuscitation efforts were discontinued given lack of benefit.  Patient had an uncomplicated postpartum course. She is ambulating, tolerating a regular diet, passing flatus, and urinating well. Patient with history of CHTN and restarted on enalapril and procardia. Patient eager to be discharged home. Patient is discharged home in stable condition on 05/14/20.  Newborn Data: Birth date:05/13/2020  Birth time:3:39 AM  Gender:Female  Living status:Living  Apgars:1 ,1  Weight:510 g   Magnesium Sulfate received: No BMZ received: No (precipitous  delivery) Rhophylac:N/A Transfusion:No  Physical exam  Vitals:   05/13/20 2356 05/14/20 0545 05/14/20 1217 05/14/20 1405  BP: (!) 139/93 (!) 137/106 (!) 119/94 120/88  Pulse: 65 74 63 85  Resp:  16 16   Temp:  98 F (36.7 C) 97.9 F (36.6 C)   TempSrc:  Oral Oral   SpO2:  99% 99%   Weight:      Height:       General: alert, cooperative and no distress Lochia: appropriate Uterine Fundus: firm DVT Evaluation: No evidence of DVT seen on physical exam. Labs: Lab Results  Component Value Date   WBC 12.2 (H) 05/13/2020   HGB 10.2 (L) 05/13/2020   HCT 32.2 (L) 05/13/2020   MCV 93.6 05/13/2020   PLT 305 05/13/2020   CMP Latest Ref Rng & Units 05/13/2020  Glucose 70 - 99 mg/dL 84  BUN 6 - 20 mg/dL 10  Creatinine 3.01 - 6.01 mg/dL 0.93  Sodium 235 - 573 mmol/L 137  Potassium 3.5 - 5.1 mmol/L 4.0  Chloride 98 - 111 mmol/L 105  CO2 22 - 32 mmol/L 23  Calcium 8.9 - 10.3 mg/dL 9.2  Total Protein 6.5 - 8.1 g/dL 6.3(L)  Total Bilirubin 0.3 - 1.2 mg/dL 0.3  Alkaline Phos 38 - 126 U/L 68  AST 15 - 41 U/L 33  ALT 0 - 44 U/L 15   Edinburgh Score: Edinburgh Postnatal Depression Scale Screening Tool 06/05/2018  I have been able to laugh and see the funny side of  things. 0  I have looked forward with enjoyment to things. 1  I have blamed myself unnecessarily when things went wrong. 1  I have been anxious or worried for no good reason. 3  I have felt scared or panicky for no good reason. 2  Things have been getting on top of me. 2  I have been so unhappy that I have had difficulty sleeping. 1  I have felt sad or miserable. 1  I have been so unhappy that I have been crying. 1  The thought of harming myself has occurred to me. 0  Edinburgh Postnatal Depression Scale Total 12     After visit meds:  Allergies as of 05/14/2020   No Known Allergies     Medication List    STOP taking these medications   amoxicillin 500 MG capsule Commonly known as: AMOXIL   azithromycin 250 MG  tablet Commonly known as: ZITHROMAX   Bayer Low Dose 81 MG EC tablet Generic drug: aspirin   calcium carbonate 500 MG chewable tablet Commonly known as: TUMS - dosed in mg elemental calcium   cyclobenzaprine 10 MG tablet Commonly known as: FLEXERIL   docusate sodium 100 MG capsule Commonly known as: COLACE   ferrous gluconate 324 MG tablet Commonly known as: FERGON   labetalol 200 MG tablet Commonly known as: NORMODYNE     TAKE these medications   acetaminophen 500 MG tablet Commonly known as: TYLENOL Take 1,000 mg by mouth every 6 (six) hours as needed.   enalapril 10 MG tablet Commonly known as: VASOTEC Take 1 tablet (10 mg total) by mouth daily. Start taking on: May 15, 2020   ibuprofen 600 MG tablet Commonly known as: ADVIL Take 1 tablet (600 mg total) by mouth every 6 (six) hours.   NIFEdipine 60 MG 24 hr tablet Commonly known as: ADALAT CC Take 1 tablet (60 mg total) by mouth 2 (two) times daily.   zolpidem 5 MG tablet Commonly known as: AMBIEN Take 1 tablet (5 mg total) by mouth at bedtime as needed for sleep.      Discharge home in stable condition Infant Disposition:morgue Discharge instruction: per After Visit Summary and Postpartum booklet. Activity: Advance as tolerated. Pelvic rest for 6 weeks.  Diet: routine diet Future Appointments: Future Appointments  Date Time Provider Department Center  05/21/2020  9:20 AM [facility] NURSE [facility]  05/24/2020 10:15 AM [facility]  06/22/2020  1:15 PM Sheila Oats, MD [facility]   Follow up Visit: Message sent to [facility] clinic on 05/14/20.  Please schedule this patient for a In person postpartum visit in 4 weeks with the following provider: MD. Additional Postpartum F/U:Postpartum Depression checkup 1 week and BP check 1 week  High risk pregnancy complicated by: preterm labor @[redacted]w[redacted]d  in s/o likely abruption with recent cocaine use >fetal demise s/p NICU  resuscitation, h/o Cesarean x1, multiple prior preterm deliveries Delivery mode:  VBAC, Spontaneous  Anticipated Birth Control:  Unsure   05/14/2020 13/11/2019, MD

## 2020-05-14 NOTE — Progress Notes (Signed)
Post Partum Day 1 Subjective: Patient reports doing well without complaints  Objective: Blood pressure (!) 137/106, pulse 74, temperature 98 F (36.7 C), temperature source Oral, resp. rate 16, height 5' (1.524 m), weight 71.2 kg, SpO2 99 %, not currently breastfeeding.  Physical Exam:  General: alert, cooperative and no distress Lochia: appropriate Uterine Fundus: firm DVT Evaluation: No evidence of DVT seen on physical exam.  Recent Labs    05/13/20 0237  HGB 10.2*  HCT 32.2*    Assessment/Plan: Patient with elevated BP postpartum without si/sx of preeclampsia Patient with history of CHTN previously on enalapril. Enalapril restarted today Continue procardia Discharge planning tomorrow or later today   LOS: 1 day   Shannon Gonzales 05/14/2020, 10:08 AM

## 2020-05-14 NOTE — Discharge Instructions (Signed)
Vaginal Delivery, Care After This sheet gives you information about how to care for yourself after your delivery. Your health care provider may also give you more specific instructions. If you have problems or questions, contact your health care provider. What can I expect after the procedure? After delivery, it is common to have:  Soreness in your abdomen, your vagina, and the area between the opening of your vagina and your anus (perineum).  Tiredness (fatigue).  Cramps.  Breast tenderness related to engorgement.  Some bleeding and discharge from your vagina. This may continue for about 6 weeks. The bleeding and discharge will start out red, then become pink, then yellow, and finally white.  Tenderness in your vagina or perineum if you had an episiotomy or a vaginal tear. This may last several weeks.  Emotions that change quickly. Common emotions include: ? Sadness. ? Anger. ? Denial. ? Guilt. ? Depression. Follow these instructions at home: Vaginal and perineal care  Keep your perineum clean and dry as told by your health care provider.  Wipe from front to back when you use the toilet.  If you have an episiotomy or a vaginal tear, check for signs of infection, such as: ? Increasing redness, swelling, or pain in your perineal area. ? Pus or bad-smelling discharge coming from your wound or vagina.  To relieve pain at the wound area or pain caused by hemorrhoids, try taking a warm sitz bath 2-3 times a day.  Do not use tampons or douche until your health care provider says it is okay. Medicines  Take over-the-counter and prescription medicines only as told by your health care provider.  If you were prescribed an antibiotic medicine, take it as told by your health care provider. Do not stop taking the antibiotic even if you start to feel better. Eating and drinking   Drink enough fluids to keep your urine pale yellow.  Eat high-fiber foods every day. These foods may help  prevent or relieve constipation. High-fiber foods include: ? Whole grain cereals and breads. ? Lemere rice. ? Beans. ? Fresh fruits and vegetables. Activity  If possible, have someone help you with your household activities for at least a few days after you leave the hospital.  Return to your normal activities as told by your health care provider. Ask your health care provider what activities are safe for you.  Rest as much as possible.  Talk with your health care provider about when you can engage in sexual activity. This may depend on your: ? Risk of infection. ? Rate of healing. ? Comfort and desire to engage in sexual activity. Emotional support  Consider seeking support for your loss. Some forms of support that you might consider include your religious leader, friends, family, a professional counselor, or a bereavement support group. General instructions  Wear a supportive and well-fitting bra.  If you pass a blood clot, save it and call your health care provider to discuss. Do not flush blood clots down the toilet before you get instructions from your health care provider.  Keep all of your scheduled postpartum visits. At these visits, your health care provider will check to make sure that you are healing, both physically and emotionally. Contact a health care provider if:  You feel sad or depressed.  You are having trouble eating or sleeping.  You lose interest in activities you used to enjoy.  You pass a blood clot from your vagina.  You have pus or a bad-smelling discharge coming from   your wound or vagina.  You have increasing redness, swelling, or pain in your perineal area.  You feel pain or burning when you urinate.  You urinate more often than normal.  You have a fever.  Your breasts become hard, red, or painful.  You are dizzy or light-headed.  You have a rash.  You feel nauseous or you vomit.  You have not had a menstrual period by the 12th week  after delivery. Get help right away if:  You have persistent pain that is not relieved by comfort measures or medicines.  You have chest pain or trouble breathing.  You have blurred vision or you see spots.  You have a severe headache.  You faint.  You have sudden, severe leg pain.  You bleed from your vagina so much that you fill more than one sanitary pad in one hour. Bleeding should not be heavier than your heaviest period.  You have thoughts of hurting yourself. If you ever feel like you may hurt yourself or others, or have thoughts about taking your own life, get help right away. You can go to your nearest emergency department or call:  Your local emergency services (911 in the [place]  A suicide crisis helpline, such as the National Suicide Prevention Lifeline at 1-800-273-8255. This is open 24 hours a day. Summary  Take over-the-counter and prescription medicines only as told by your health care provider.  Return to your normal activities as told by your health care provider. Ask your health care provider what activities are safe for you.  Consider getting support for your loss. Sources of support include religious leaders, friends, family, professional counselors, and bereavement support groups.  Keep all follow-up visits as told by your health care provider. This is important. This information is not intended to replace advice given to you by your health care provider. Make sure you discuss any questions you have with your health care provider. Document Revised: 07/11/2017 Document Reviewed: 10/05/2016 Elsevier Patient Education  2020 Elsevier Inc.  

## 2020-05-18 NOTE — BH Specialist Note (Deleted)
Integrated Behavioral Health via Telemedicine Video (Caregility) Visit  05/18/2020 Shannon Gonzales [id number]  Number of Integrated Behavioral Health visits: *** Session Start time: ***  Session End time: *** Total time: {IBH Total Time:21014050} minutes  Referring Provider: *** Type of Service: Individual, Family, *** Patient/Family location: *** [facility] Provider location: *** All persons participating in visit: ***   I connected with Shannon Gonzales and/or Shannon Gonzales's {family members:20773} by a video enabled telemedicine application (Caregility) and verified that I am speaking with the correct person using two identifiers.   Discussed confidentiality: {YES/NO:21197}  Confirmed demographics & insurance:  {YES/NO:21197}  I discussed that engaging in this virtual visit, they consent to the provision of behavioral healthcare and the services will be billed under their insurance.   Patient and/or legal guardian expressed understanding and consented to virtual visit: {YES/NO:21197}  PRESENTING CONCERNS: Patient and/or family reports the following symptoms/concerns: *** Duration of problem: ***; Severity of problem: {Mild/Moderate/Severe:20260}  STRENGTHS (Protective Factors/Coping Skills): {CHL AMB BH PROTECTIVE FACTORS/STRENGTHS:[phone]}  ASSESSMENT: Patient currently experiencing ***.    GOALS ADDRESSED: Patient will: 1.  Reduce symptoms of: {IBH Symptoms:21014056}  2.  Increase knowledge and/or ability of: {IBH Patient Tools:21014057}  3.  Demonstrate ability to: {IBH Goals:21014053}   Progress of Goals: {CHL AMB BH PROGRESS TOWARDS WUJWJ:1914782956}  INTERVENTIONS: Interventions utilized:  {IBH Interventions:21014054} Standardized Assessments completed & reviewed: {IBH Screening Tools:21014051}   OUTCOME: Patient Response: ***   PLAN: 1. Follow up with behavioral health clinician on : *** 2. Behavioral recommendations: *** 3. Referral(s): {IBH  Referrals:21014055}  I discussed the assessment and treatment plan with the patient and/or parent/guardian. They were provided an opportunity to ask questions and all were answered. They agreed with the plan and demonstrated an understanding of the instructions.   They were advised to call back or seek an in-person evaluation as appropriate.  I discussed that the purpose of this visit is to provide behavioral health care while limiting exposure to the novel coronavirus.  Discussed there is a possibility of technology failure and discussed alternative modes of communication if that failure occurs.  Valetta Close Damean Poffenberger

## 2020-05-21 ENCOUNTER — Ambulatory Visit: Payer: Self-pay

## 2020-05-25 ENCOUNTER — Ambulatory Visit: Payer: Medicaid Other | Admitting: Clinical

## 2020-05-25 DIAGNOSIS — Z5329 Procedure and treatment not carried out because of patient's decision for other reasons: Secondary | ICD-10-CM

## 2020-05-25 DIAGNOSIS — Z91199 Patient's noncompliance with other medical treatment and regimen due to unspecified reason: Secondary | ICD-10-CM

## 2020-05-25 NOTE — BH Specialist Note (Signed)
Pt did not arrive to video visit and did not answer the phone ; Left HIPPA-compliant message to call back Zyren Sevigny from [facility] at [phone] (main office) or [phone] (Zakkary Thibault's office).  ; left MyChart message for patient.      

## 2020-06-22 ENCOUNTER — Ambulatory Visit: Payer: Self-pay | Admitting: Obstetrics and Gynecology

## 2020-08-30 ENCOUNTER — Emergency Department (HOSPITAL_COMMUNITY)
Admission: EM | Admit: 2020-08-30 | Discharge: 2020-08-30 | Disposition: A | Discharge status: Home / Self Care | Payer: Medicaid Other | Attending: Emergency Medicine | Admitting: Emergency Medicine

## 2020-08-30 ENCOUNTER — Emergency Department (HOSPITAL_COMMUNITY): Payer: Medicaid Other

## 2020-08-30 ENCOUNTER — Other Ambulatory Visit: Payer: Self-pay

## 2020-08-30 ENCOUNTER — Encounter (HOSPITAL_COMMUNITY): Payer: Self-pay

## 2020-08-30 DIAGNOSIS — Z8701 Personal history of pneumonia (recurrent): Secondary | ICD-10-CM | POA: Diagnosis not present

## 2020-08-30 DIAGNOSIS — Z79899 Other long term (current) drug therapy: Secondary | ICD-10-CM | POA: Diagnosis not present

## 2020-08-30 DIAGNOSIS — Z87891 Personal history of nicotine dependence: Secondary | ICD-10-CM | POA: Insufficient documentation

## 2020-08-30 DIAGNOSIS — F419 Anxiety disorder, unspecified: Secondary | ICD-10-CM | POA: Diagnosis not present

## 2020-08-30 DIAGNOSIS — R002 Palpitations: Secondary | ICD-10-CM | POA: Diagnosis not present

## 2020-08-30 DIAGNOSIS — R0789 Other chest pain: Secondary | ICD-10-CM | POA: Diagnosis not present

## 2020-08-30 DIAGNOSIS — I10 Essential (primary) hypertension: Secondary | ICD-10-CM | POA: Insufficient documentation

## 2020-08-30 DIAGNOSIS — Z7982 Long term (current) use of aspirin: Secondary | ICD-10-CM | POA: Insufficient documentation

## 2020-08-30 DIAGNOSIS — R079 Chest pain, unspecified: Secondary | ICD-10-CM

## 2020-08-30 LAB — CBC
HCT: 40.2 % (ref 36.0–46.0)
Hemoglobin: 13.1 g/dL (ref 12.0–15.0)
MCH: 30.3 pg (ref 26.0–34.0)
MCHC: 32.6 g/dL (ref 30.0–36.0)
MCV: 93.1 fL (ref 80.0–100.0)
Platelets: 365 10*3/uL (ref 150–400)
RBC: 4.32 MIL/uL (ref 3.87–5.11)
RDW: 14.7 % (ref 11.5–15.5)
WBC: 8.8 10*3/uL (ref 4.0–10.5)
nRBC: 0 % (ref 0.0–0.2)

## 2020-08-30 LAB — I-STAT BETA HCG BLOOD, ED (NOT ORDERABLE): I-stat hCG, quantitative: <5 m[IU]/mL (ref <5)

## 2020-08-30 LAB — BASIC METABOLIC PANEL
Anion gap: 11 (ref 5–15)
BUN: 11 mg/dL (ref 6–20)
CO2: 23 mmol/L (ref 22–32)
Calcium: 9.2 mg/dL (ref 8.9–10.3)
Chloride: 103 mmol/L (ref 98–111)
Creatinine, Ser: 0.61 mg/dL (ref 0.44–1.00)
GFR, Estimated: >60 mL/min (ref >60)
Glucose, Bld: 74 mg/dL (ref 70–99)
Potassium: 3.4 mmol/L — ABNORMAL LOW (ref 3.5–5.1)
Sodium: 137 mmol/L (ref 135–145)

## 2020-08-30 LAB — TROPONIN I (HIGH SENSITIVITY)
Troponin I (High Sensitivity): 4 ng/L (ref <18)
Troponin I (High Sensitivity): 4 ng/L (ref <18)

## 2020-08-30 MED ORDER — HYDROXYZINE HCL 25 MG PO TABS: 25.0000 mg | ORAL_TABLET | Freq: Four times a day (QID) | ORAL | 0 refills | Status: DC | PRN

## 2020-08-30 MED ORDER — PANTOPRAZOLE SODIUM 20 MG PO TBEC: 40.0000 mg | DELAYED_RELEASE_TABLET | Freq: Every day | ORAL | 0 refills | Status: DC

## 2020-08-30 NOTE — ED Triage Notes (Signed)
Pt arrived via walk in, c/o mid sternal chest pain, radiating to left arm. Worsening with deep breathing and mvmt. Denies any sick contacts.

## 2020-08-30 NOTE — ED Provider Notes (Signed)
[facility]-EMERGENCY DEPT Provider Note   CSN: [id number] Arrival date & time: 08/30/20  1328     History Chief Complaint  Patient presents with  . Chest Pain    Shannon Gonzales is a 35 y.o. female.  HPI      Chest pain, has been going on for a few months If laying down feels it, radiates to the left arm, sharp pain Occurred all last night and today Was treated here for pneumonia in October, took medications and improved but CP came back and didn't return Takes aspirin every day, helps the CP.  325mg  aspirin takes 8 pills a day, taking it this way for one year. No dyspnea or tinnitus. Worse at night, worse when sleeping on back, then feels sharp pain when laying on side, palpitations, worse with laying down and still Not worse with eating When at work does not notice it, not exertional No shortness of breath, no nausea or vomiting, no fever, no cough, diarrhea, abdominal pain, black or bloody stools, headache every once in a while  No leg pain or swelling, no long trips car/airplane, surgeries, immobilization, no hx of DVT/PE, no fam hx of DVT/PE  No known fam hx of early CAD Cigarettes, occ etoh, no other drugs  Hx of htn, on medication x 2  Last injection November Depo would like to renew contraception  Has 4 daughters, ranging age 7-14 and had a stillbirth in November. Is under a lot of stress, reports anxiety and difficulty sleeping  Past Medical History:  Diagnosis Date  . Anxiety   . Depression   . Drug abuse, marijuana    with pregnancy  . Hx of PTL (preterm labor), current pregnancy   . Hypertension   . Late prenatal care   . Trichimoniasis    with pregnancy    Patient Active Problem List   Diagnosis Date Noted  . Vaginal delivery 05/14/2020  . Supervision of high-risk pregnancy 05/13/2020  . Elevated liver enzymes 07/22/2019  . Fetal demise > 22 weeks, delivered, current hospitalization 07/20/2019  . Placental abruption  07/20/2019  . Chronic hypertension with superimposed preeclampsia 06/07/2018  . Preterm labor in third trimester 06/05/2018  . Hypertension in pregnancy, essential, antepartum, third trimester 03/28/2018  . History of preterm delivery, currently pregnant in second trimester 03/28/2018  . Supervision of high risk pregnancy, antepartum, second trimester 03/28/2018  . No prenatal care in current pregnancy 03/28/2018  . History of VBAC 03/28/2018  . Anxiety 03/28/2018  . Smoker 03/28/2018  . Marijuana use 03/28/2018    Past Surgical History:  Procedure Laterality Date  . CESAREAN SECTION       OB History    Gravida  10   Para  7   Term  1   Preterm  6   AB  3   Living  6     SAB  2   IAB  1   Ectopic      Multiple  0   Live Births  3           Family History  Problem Relation Age of Onset  . Hypertension Mother     Social History   Tobacco Use  . Smoking status: Former Smoker    Packs/day: 0.10    Types: Cigarettes  . Smokeless tobacco: Never Used  Vaping Use  . Vaping Use: Never used  Substance Use Topics  . Alcohol use: No  . Drug use: Not Currently  Types: Hashish, Marijuana    Home Medications Prior to Admission medications   Medication Sig Start Date End Date Taking? Authorizing Provider  hydrOXYzine (ATARAX/VISTARIL) 25 MG tablet Take 1-2 tablets (25-50 mg total) by mouth every 6 (six) hours as needed. 08/30/20  Yes Alvira Monday, MD  pantoprazole (PROTONIX) 20 MG tablet Take 2 tablets (40 mg total) by mouth daily. 08/30/20  Yes Alvira Monday, MD  acetaminophen (TYLENOL) 500 MG tablet Take 1,000 mg by mouth every 6 (six) hours as needed.    [provider]  enalapril (VASOTEC) 10 MG tablet Take 1 tablet (10 mg total) by mouth daily. 05/15/20   Constant, Peggy, MD  ibuprofen (ADVIL) 600 MG tablet Take 1 tablet (600 mg total) by mouth every 6 (six) hours. 05/14/20   Constant, Peggy, MD  NIFEdipine (ADALAT CC) 60 MG 24 hr tablet  Take 1 tablet (60 mg total) by mouth 2 (two) times daily. 05/14/20   Constant, Peggy, MD  zolpidem (AMBIEN) 5 MG tablet Take 1 tablet (5 mg total) by mouth at bedtime as needed for sleep. 05/14/20   Constant, Peggy, MD    Allergies    Patient has no known allergies.  Review of Systems   Review of Systems  Constitutional: Negative for diaphoresis and fever.  Respiratory: Negative for cough and shortness of breath.   Cardiovascular: Positive for chest pain and palpitations (every other day). Negative for leg swelling.  Gastrointestinal: Negative for abdominal pain, nausea and vomiting.  Musculoskeletal: Positive for arthralgias and myalgias.  Neurological: Negative for syncope and headaches.    Physical Exam Updated Vital Signs BP 121/82   Pulse 81   Temp (!) 97.5 F (36.4 C) (Oral)   Resp 18   SpO2 99%   Physical Exam Vitals and nursing note reviewed.  Constitutional:      General: She is not in acute distress.    Appearance: She is well-developed and well-nourished. She is not diaphoretic.  HENT:     Head: Normocephalic and atraumatic.  Eyes:     Extraocular Movements: EOM normal.     Conjunctiva/sclera: Conjunctivae normal.  Cardiovascular:     Rate and Rhythm: Normal rate and regular rhythm.     Pulses: Intact distal pulses.     Heart sounds: Normal heart sounds. No murmur heard. No friction rub. No gallop.   Pulmonary:     Effort: Pulmonary effort is normal. No respiratory distress.     Breath sounds: Normal breath sounds. No wheezing or rales.  Abdominal:     General: There is no distension.     Palpations: Abdomen is soft.     Tenderness: There is no abdominal tenderness. There is no guarding.  Musculoskeletal:        General: No tenderness or edema.     Cervical back: Normal range of motion.  Skin:    General: Skin is warm and dry.     Findings: No erythema or rash.  Neurological:     Mental Status: She is alert and oriented to person, place, and time.      ED Results / Procedures / Treatments   Labs (all labs ordered are listed, but only abnormal results are displayed) Labs Reviewed  BASIC METABOLIC PANEL - Abnormal; Notable for the following components:      Result Value   Potassium 3.4 (*)    All other components within normal limits  CBC  I-STAT BETA HCG BLOOD, ED ([facility], [facility], [facility] ONLY)  I-STAT BETA HCG BLOOD,  ED (NOT ORDERABLE)  TROPONIN I (HIGH SENSITIVITY)  TROPONIN I (HIGH SENSITIVITY)    EKG EKG Interpretation  Date/Time:  Monday August 30 2020 13:34:07 EST Ventricular Rate:  106 PR Interval:    QRS Duration: 82 QT Interval:  346 QTC Calculation: 460 R Axis:   66 Text Interpretation: Sinus tachycardia Artifact 12 Lead; Mason-Likar No significant change since last tracing Confirmed by Alvira Monday ([id number]) on 08/30/2020 7:47:58 PM   Radiology DG Chest 2 View  Result Date: 08/30/2020 CLINICAL DATA:  35 year old female with history of chest pain with radiation into the left arm, worsening with deep breathing. EXAM: CHEST - 2 VIEW COMPARISON:  Chest x-ray 02/11/2016. FINDINGS: Lung volumes are normal. No consolidative airspace disease. No pleural effusions. No pneumothorax. No pulmonary nodule or mass noted. Pulmonary vasculature and the cardiomediastinal silhouette are within normal limits. IMPRESSION: No radiographic evidence of acute cardiopulmonary disease. Electronically Signed   By: Trudie Reed M.D.   On: 08/30/2020 14:48    Procedures Procedures   Medications Ordered in ED Medications - No data to display  ED Course  I have reviewed the triage vital signs and the nursing notes.  Pertinent labs & imaging results that were available during my care of the patient were reviewed by me and considered in my medical decision making (see chart for details).    MDM Rules/Calculators/A&P                          34yo female with history of hypertension presents with concern for chest pain. Differential  diagnosis for chest pain includes pulmonary embolus, dissection, pneumothorax, pneumonia, ACS, myocarditis, pericarditis.  EKG was done and evaluate by me and showed no acute ST changes and no signs of pericarditis. Chest x-ray was done and evaluated by me and radiology and showed no sign of pneumonia or pneumothorax. Patient is low risk [place], has no dyspnea, normal O2 saturation, no asymmetric swelling, and has had pain for months and have low suspicion for PE.  Patient is low risk HEART score and had delta troponins which were both negative.  Do not feel history or exam are consistent with aortic dissection given normal bilateral upper and lower extremity pulses, duration of symptoms, normal vital signs/mediastinum on XR.  Discussed that I do not see emergent etiology of chest pain. Given she has palpitations as well feel Cardiology follow up appropriate. In sinus rhythm in ED. Discussed that ddx for pain worse at night also includes GERD or anxiety.  Recommend follow up with PCP and Cardiology.  Reports she does have a lot of stress and anxiety, wll give rx for hydroxyzine as well as PPI for possible GERD. Patient discharged in stable condition with understanding of reasons to return.    Final Clinical Impression(s) / ED Diagnoses Final diagnoses:  Nonspecific chest pain  Anxiety  Palpitations    Rx / DC Orders ED Discharge Orders         Ordered    hydrOXYzine (ATARAX/VISTARIL) 25 MG tablet  Every 6 hours PRN        08/30/20 1952    pantoprazole (PROTONIX) 20 MG tablet  Daily        08/30/20 1952           Alvira Monday, MD 09/01/20 1027

## 2020-08-30 NOTE — ED Notes (Signed)
An After Visit Summary was printed and given to the patient. Discharge instructions given and no further questions at this time.  

## 2020-09-28 ENCOUNTER — Other Ambulatory Visit: Payer: Self-pay | Admitting: Obstetrics and Gynecology

## 2020-09-29 ENCOUNTER — Emergency Department (HOSPITAL_COMMUNITY)
Admission: EM | Admit: 2020-09-29 | Discharge: 2020-09-29 | Disposition: A | Discharge status: Home / Self Care | Payer: Medicaid Other | Attending: Emergency Medicine | Admitting: Emergency Medicine

## 2020-09-29 ENCOUNTER — Emergency Department (HOSPITAL_COMMUNITY): Payer: Medicaid Other

## 2020-09-29 ENCOUNTER — Other Ambulatory Visit: Payer: Self-pay

## 2020-09-29 ENCOUNTER — Encounter (HOSPITAL_COMMUNITY): Payer: Self-pay

## 2020-09-29 DIAGNOSIS — Z79899 Other long term (current) drug therapy: Secondary | ICD-10-CM | POA: Insufficient documentation

## 2020-09-29 DIAGNOSIS — F419 Anxiety disorder, unspecified: Secondary | ICD-10-CM | POA: Insufficient documentation

## 2020-09-29 DIAGNOSIS — Z87891 Personal history of nicotine dependence: Secondary | ICD-10-CM | POA: Diagnosis not present

## 2020-09-29 DIAGNOSIS — R0789 Other chest pain: Secondary | ICD-10-CM

## 2020-09-29 LAB — I-STAT BETA HCG BLOOD, ED (MC, WL, AP ONLY): I-stat hCG, quantitative: 20.6 m[IU]/mL — ABNORMAL HIGH (ref <5)

## 2020-09-29 LAB — TROPONIN I (HIGH SENSITIVITY)
Troponin I (High Sensitivity): 6 ng/L (ref <18)
Troponin I (High Sensitivity): 4 ng/L (ref <18)

## 2020-09-29 LAB — HCG, SERUM, QUALITATIVE: Preg, Serum: NEGATIVE

## 2020-09-29 LAB — BASIC METABOLIC PANEL
Anion gap: 11 (ref 5–15)
BUN: 10 mg/dL (ref 6–20)
CO2: 24 mmol/L (ref 22–32)
Calcium: 8.9 mg/dL (ref 8.9–10.3)
Chloride: 100 mmol/L (ref 98–111)
Creatinine, Ser: 0.66 mg/dL (ref 0.44–1.00)
GFR, Estimated: >60 mL/min (ref >60)
Glucose, Bld: 73 mg/dL (ref 70–99)
Potassium: 3 mmol/L — ABNORMAL LOW (ref 3.5–5.1)
Sodium: 135 mmol/L (ref 135–145)

## 2020-09-29 LAB — CBC
HCT: 35.3 % — ABNORMAL LOW (ref 36.0–46.0)
Hemoglobin: 11.9 g/dL — ABNORMAL LOW (ref 12.0–15.0)
MCH: 31.5 pg (ref 26.0–34.0)
MCHC: 33.7 g/dL (ref 30.0–36.0)
MCV: 93.4 fL (ref 80.0–100.0)
Platelets: 422 10*3/uL — ABNORMAL HIGH (ref 150–400)
RBC: 3.78 MIL/uL — ABNORMAL LOW (ref 3.87–5.11)
RDW: 15.1 % (ref 11.5–15.5)
WBC: 14.3 10*3/uL — ABNORMAL HIGH (ref 4.0–10.5)
nRBC: 0 % (ref 0.0–0.2)

## 2020-09-29 LAB — D-DIMER, QUANTITATIVE: D-Dimer, Quant: 0.64 ug/mL-FEU — ABNORMAL HIGH (ref 0.00–0.50)

## 2020-09-29 MED ORDER — HYDROXYZINE HCL 10 MG PO TABS: 10.0000 mg | ORAL_TABLET | Freq: Three times a day (TID) | ORAL | 0 refills | Status: AC | PRN

## 2020-09-29 MED ORDER — HYDROXYZINE HCL 10 MG PO TABS
10.0000 mg | ORAL_TABLET | Freq: Once | ORAL | Status: AC
  Administered 2020-09-29: 10 mg via ORAL
  Filled 2020-09-29: qty 1

## 2020-09-29 MED ORDER — IOHEXOL 350 MG/ML SOLN
100.0000 mL | Freq: Once | INTRAVENOUS | Status: AC | PRN
  Administered 2020-09-29: 100 mL via INTRAVENOUS

## 2020-09-29 MED ORDER — ASPIRIN 81 MG PO CHEW
324.0000 mg | CHEWABLE_TABLET | Freq: Once | ORAL | Status: AC
  Administered 2020-09-29: 324 mg via ORAL
  Filled 2020-09-29: qty 4

## 2020-09-29 MED ORDER — POTASSIUM CHLORIDE CRYS ER 20 MEQ PO TBCR
40.0000 meq | EXTENDED_RELEASE_TABLET | Freq: Once | ORAL | Status: AC
  Administered 2020-09-29: 40 meq via ORAL
  Filled 2020-09-29: qty 2

## 2020-09-29 MED ORDER — NAPROXEN 500 MG PO TABS: 500.0000 mg | ORAL_TABLET | Freq: Two times a day (BID) | ORAL | 0 refills | Status: AC

## 2020-09-29 NOTE — ED Provider Notes (Signed)
10:00 PM Signout from [employer] at shift change, currently pending 2nd troponin and pregnancy testing.   Pregnancy and 2nd trop are negative. Plan d/c per instructions from Couture PA-C.   BP (!) 156/109   Pulse 72   Temp 98.7 F (37.1 C) (Oral)   Resp 18   Ht 5' (1.524 m)   Wt 69.9 kg   LMP 09/22/2020 (Exact Date)   SpO2 99%   BMI 30.08 kg/m     Renne Crigler, Cordelia Poche 09/29/20 2225    Linwood Dibbles, MD 09/30/20 1504

## 2020-09-29 NOTE — Discharge Instructions (Addendum)
You may alternate taking Tylenol and Naproxen as needed for pain control. You may take Naproxen twice daily as directed on your discharge paperwork and you may take  [phone] mg of Tylenol every 6 hours. Do not exceed 4000 mg of Tylenol daily as this can lead to liver damage. Also, make sure to take Naproxen with meals as it can cause an upset stomach. Do not take other NSAIDs while taking Naproxen such as (Aleve, Ibuprofen, Aspirin, Celebrex, etc) and do not take more than the prescribed dose as this can lead to ulcers and bleeding in your GI tract. You may use warm and cold compresses to help with your symptoms.   You were given a prescription for hydroxyzine for anxiety.  You should not drive, work, or operate machinery while taking this medication as it can make you very drowsy.    Please follow up with your primary doctor within the next 7-10 days for re-evaluation and further treatment of your symptoms.   Please return to the ER sooner if you have any new or worsening symptoms.

## 2020-09-29 NOTE — ED Notes (Signed)
Patient transported to CT 

## 2020-09-29 NOTE — ED Triage Notes (Signed)
Pt states x3 days she has experienced pain under her left breast/underneath her left armpit. Pt states that everything she tries has given her no relief. Pt has hx of HTN and anxiety. Pt has not taken her BP meds in 4 days due to running out, pt took dose around lunch. Pt states she does not take anything for her anxiety. A&Ox4.

## 2020-09-29 NOTE — ED Provider Notes (Addendum)
[facility]-EMERGENCY DEPT Provider Note   CSN: [id number] Arrival date & time: 09/29/20  1819     History Chief Complaint  Patient presents with  . Chest Pain    Shannon Gonzales is a 35 y.o. female.  HPI    36 y/o female with a h/o anxiety, depression, marijuana use, HTN, who presents to the ED today for eval of chest pain that started 3 days ago. States pain is constant in nature and located to the left side of the chest. Describes pain as sharp and achy. Pain is worse with inspiration and when laying flat. Pain is also worse with movement. Denies any falls or trauma. Denies rashes. Denies diaphoresis, nausea, shortness of breath, cough, fevers. Denies abd pain. She took asa yesterday which improved sxs temporarily.  Denies leg pain/swelling, hemoptysis, recent surgery/trauma, recent long travel, hormone use, personal hx of cancer, or hx of DVT/PE. Denies any h/o tobacco use or etoh use. Denies any early fam hx of cardiac disease.  She further reports she has a history of anxiety.  She was seen for similar presentation about a month ago and at that time her work-up was negative and she was given a prescription for hydroxyzine.  She states she did take this medication and it did improve her symptoms however she ran out and is not able to get a follow-up appointment with her PCP for a month so would like a refill of this medication.  Past Medical History:  Diagnosis Date  . Anxiety   . Depression   . Drug abuse, marijuana    with pregnancy  . Hx of PTL (preterm labor), current pregnancy   . Hypertension   . Late prenatal care   . Trichimoniasis    with pregnancy    Patient Active Problem List   Diagnosis Date Noted  . Vaginal delivery 05/14/2020  . Supervision of high-risk pregnancy 05/13/2020  . Elevated liver enzymes 07/22/2019  . Fetal demise > 22 weeks, delivered, current hospitalization 07/20/2019  . Placental abruption 07/20/2019  . Chronic  hypertension with superimposed preeclampsia 06/07/2018  . Preterm labor in third trimester 06/05/2018  . Hypertension in pregnancy, essential, antepartum, third trimester 03/28/2018  . History of preterm delivery, currently pregnant in second trimester 03/28/2018  . Supervision of high risk pregnancy, antepartum, second trimester 03/28/2018  . No prenatal care in current pregnancy 03/28/2018  . History of VBAC 03/28/2018  . Anxiety 03/28/2018  . Smoker 03/28/2018  . Marijuana use 03/28/2018    Past Surgical History:  Procedure Laterality Date  . CESAREAN SECTION       OB History    Gravida  10   Para  7   Term  1   Preterm  6   AB  3   Living  6     SAB  2   IAB  1   Ectopic      Multiple  0   Live Births  3           Family History  Problem Relation Age of Onset  . Hypertension Mother     Social History   Tobacco Use  . Smoking status: Former Smoker    Packs/day: 0.10    Types: Cigarettes  . Smokeless tobacco: Never Used  Vaping Use  . Vaping Use: Never used  Substance Use Topics  . Alcohol use: No  . Drug use: Not Currently    Types: Hashish, Marijuana    Home Medications  Prior to Admission medications   Medication Sig Start Date End Date Taking? Authorizing Provider  hydrOXYzine (ATARAX/VISTARIL) 10 MG tablet Take 1 tablet (10 mg total) by mouth every 8 (eight) hours as needed for up to 16 doses for anxiety. 09/29/20  Yes Couture, Cortni S, PA-C  naproxen (NAPROSYN) 500 MG tablet Take 1 tablet (500 mg total) by mouth 2 (two) times daily for 7 days. 09/29/20 10/06/20 Yes Couture, Cortni S, PA-C  acetaminophen (TYLENOL) 500 MG tablet Take 1,000 mg by mouth every 6 (six) hours as needed.    [provider]  enalapril (VASOTEC) 10 MG tablet Take 1 tablet (10 mg total) by mouth daily. 05/15/20   Constant, Peggy, MD  NIFEdipine (ADALAT CC) 60 MG 24 hr tablet Take 1 tablet (60 mg total) by mouth 2 (two) times daily. 05/14/20   Constant, Peggy,  MD  pantoprazole (PROTONIX) 20 MG tablet Take 2 tablets (40 mg total) by mouth daily. 08/30/20   Alvira MondaySchlossman, Erin, MD  zolpidem (AMBIEN) 5 MG tablet Take 1 tablet (5 mg total) by mouth at bedtime as needed for sleep. 05/14/20   Constant, Peggy, MD    Allergies    Patient has no known allergies.  Review of Systems   Review of Systems  Constitutional: Negative for chills and fever.  HENT: Negative for ear pain and sore throat.   Eyes: Negative for pain and visual disturbance.  Respiratory: Negative for cough and shortness of breath.   Cardiovascular: Positive for chest pain.  Gastrointestinal: Negative for abdominal pain, constipation, diarrhea, nausea and vomiting.  Genitourinary: Negative for dysuria and hematuria.  Musculoskeletal: Negative for back pain.  Skin: Negative for rash.  Neurological: Negative for headaches.  All other systems reviewed and are negative.   Physical Exam Updated Vital Signs BP (!) 156/109   Pulse 72   Temp 98.7 F (37.1 C) (Oral)   Resp 18   Ht 5' (1.524 m)   Wt 69.9 kg   LMP 09/22/2020 (Exact Date)   SpO2 99%   BMI 30.08 kg/m   Physical Exam Vitals and nursing note reviewed.  Constitutional:      General: She is not in acute distress.    Appearance: She is well-developed.  HENT:     Head: Normocephalic and atraumatic.  Eyes:     Conjunctiva/sclera: Conjunctivae normal.  Cardiovascular:     Rate and Rhythm: Normal rate and regular rhythm.     Heart sounds: No murmur heard.   Pulmonary:     Effort: Pulmonary effort is normal. No respiratory distress.     Breath sounds: Normal breath sounds. No decreased breath sounds, wheezing, rhonchi or rales.  Chest:     Comments: Mild left chest wall ttp but this does not necessarily reproduce pain Abdominal:     General: Bowel sounds are normal.     Palpations: Abdomen is soft.     Tenderness: There is no abdominal tenderness. There is no guarding or rebound.  Musculoskeletal:     Cervical  back: Neck supple.  Skin:    General: Skin is warm and dry.  Neurological:     Mental Status: She is alert.     ED Results / Procedures / Treatments   Labs (all labs ordered are listed, but only abnormal results are displayed) Labs Reviewed  BASIC METABOLIC PANEL - Abnormal; Notable for the following components:      Result Value   Potassium 3.0 (*)    All other components within normal limits  CBC - Abnormal; Notable for the following components:   WBC 14.3 (*)    RBC 3.78 (*)    Hemoglobin 11.9 (*)    HCT 35.3 (*)    Platelets 422 (*)    All other components within normal limits  D-DIMER, QUANTITATIVE - Abnormal; Notable for the following components:   D-Dimer, Quant 0.64 (*)    All other components within normal limits  I-STAT BETA HCG BLOOD, ED ([facility], [facility], [facility] ONLY) - Abnormal; Notable for the following components:   I-stat hCG, quantitative 20.6 (*)    All other components within normal limits  HCG, SERUM, QUALITATIVE  TROPONIN I (HIGH SENSITIVITY)  TROPONIN I (HIGH SENSITIVITY)    EKG None  Radiology DG Chest 2 View  Result Date: 09/29/2020 CLINICAL DATA:  Chest pain EXAM: CHEST - 2 VIEW COMPARISON:  None. FINDINGS: The heart size and mediastinal contours are within normal limits. Both lungs are clear. The visualized skeletal structures are unremarkable. IMPRESSION: No active cardiopulmonary disease. Electronically Signed   By: Jonna Clark M.D.   On: 09/29/2020 19:00   CT Angio Chest PE W and/or Wo Contrast  Result Date: 09/29/2020 CLINICAL DATA:  Positive D-dimer.  Left side chest pain EXAM: CT ANGIOGRAPHY CHEST WITH CONTRAST TECHNIQUE: Multidetector CT imaging of the chest was performed using the standard protocol during bolus administration of intravenous contrast. Multiplanar CT image reconstructions and MIPs were obtained to evaluate the vascular anatomy. CONTRAST:  OMNIPAQUE IOHEXOL 350 MG/ML SOLN COMPARISON:  04/18/2020 FINDINGS: Cardiovascular: No filling  defects in the pulmonary arteries to suggest pulmonary emboli. Heart is normal size. Aorta is normal caliber. 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 Mediastinum/Nodes: No mediastinal, hilar, or axillary adenopathy. Trachea and esophagus are unremarkable. Thyroid unremarkable. Lungs/Pleura: Lungs are clear. No focal airspace opacities or suspicious nodules. No effusions. Upper Abdomen: Imaging into the upper abdomen demonstrates no acute findings. Musculoskeletal: Chest wall soft tissues are unremarkable. No acute bony abnormality. Review of the MIP images confirms the above findings. IMPRESSION: No evidence of pulmonary embolus. No acute cardiopulmonary disease. Electronically Signed   By: Charlett Nose M.D.   On: 09/29/2020 21:11    Procedures Procedures   Medications Ordered in ED Medications  aspirin chewable tablet 324 mg (324 mg Oral Given 09/29/20 1954)  hydrOXYzine (ATARAX/VISTARIL) tablet 10 mg (10 mg Oral Given 09/29/20 1954)  potassium chloride SA (KLOR-CON) CR tablet 40 mEq (40 mEq Oral Given 09/29/20 2104)  iohexol (OMNIPAQUE) 350 MG/ML injection 100 mL (100 mLs Intravenous Contrast Given 09/29/20 2054)    ED Course  I have reviewed the triage vital signs and the nursing notes.  Pertinent labs & imaging results that were available during my care of the patient were reviewed by me and considered in my medical decision making (see chart for details).    MDM Rules/Calculators/A&P                          35 year old female presenting for evaluation of left-sided chest pain.  Reviewed/interpreted labs CBC, BMP, are reassuring D-dimer is elevated and will proceed with CTA Initial troponin is negative, delta troponin pending at the time of shift change  EKG with NSR, LAE, no acute ischemic changes  CXR - No active cardiopulmonary disease CTA chest - No evidence of pulmonary embolus. No acute cardiopulmonary disease.  Pt work-up is reassuring thus far.  At  shift change, care transitioned to Rhea Bleacher, PA-C with plan to  follow-up on pending troponin.  If negative I feel that the patient is safe for discharge home.  I suspect symptoms are related to pleurisy and recommend anti-inflammatories.  We will also refill hydroxyzine until she can follow-up with PCP.  Final Clinical Impression(s) / ED Diagnoses Final diagnoses:  Atypical chest pain  Anxiety    Rx / DC Orders ED Discharge Orders         Ordered    naproxen (NAPROSYN) 500 MG tablet  2 times daily        09/29/20 2131    hydrOXYzine (ATARAX/VISTARIL) 10 MG tablet  Every 8 hours PRN        09/29/20 2145           Karrie Meres, PA-C 09/29/20 2144    Karrie Meres, PA-C 09/29/20 2146    Gerhard Munch, MD 10/05/20 1932

## 2020-10-12 ENCOUNTER — Ambulatory Visit (INDEPENDENT_AMBULATORY_CARE_PROVIDER_SITE_OTHER): Payer: Medicaid Other | Admitting: Primary Care

## 2021-09-06 ENCOUNTER — Ambulatory Visit (INDEPENDENT_AMBULATORY_CARE_PROVIDER_SITE_OTHER): Payer: Medicaid Other | Admitting: Primary Care

## 2021-10-06 IMAGING — CR DG CHEST 2V
2 series · 2 of 2 positions shown · non-contrast
Comparison: Chest x-ray 02/11/2016.

CLINICAL DATA: 34-year-old female with history of chest pain with
radiation into the left arm, worsening with deep breathing.

EXAM:
CHEST - 2 VIEW

[w chest pa]
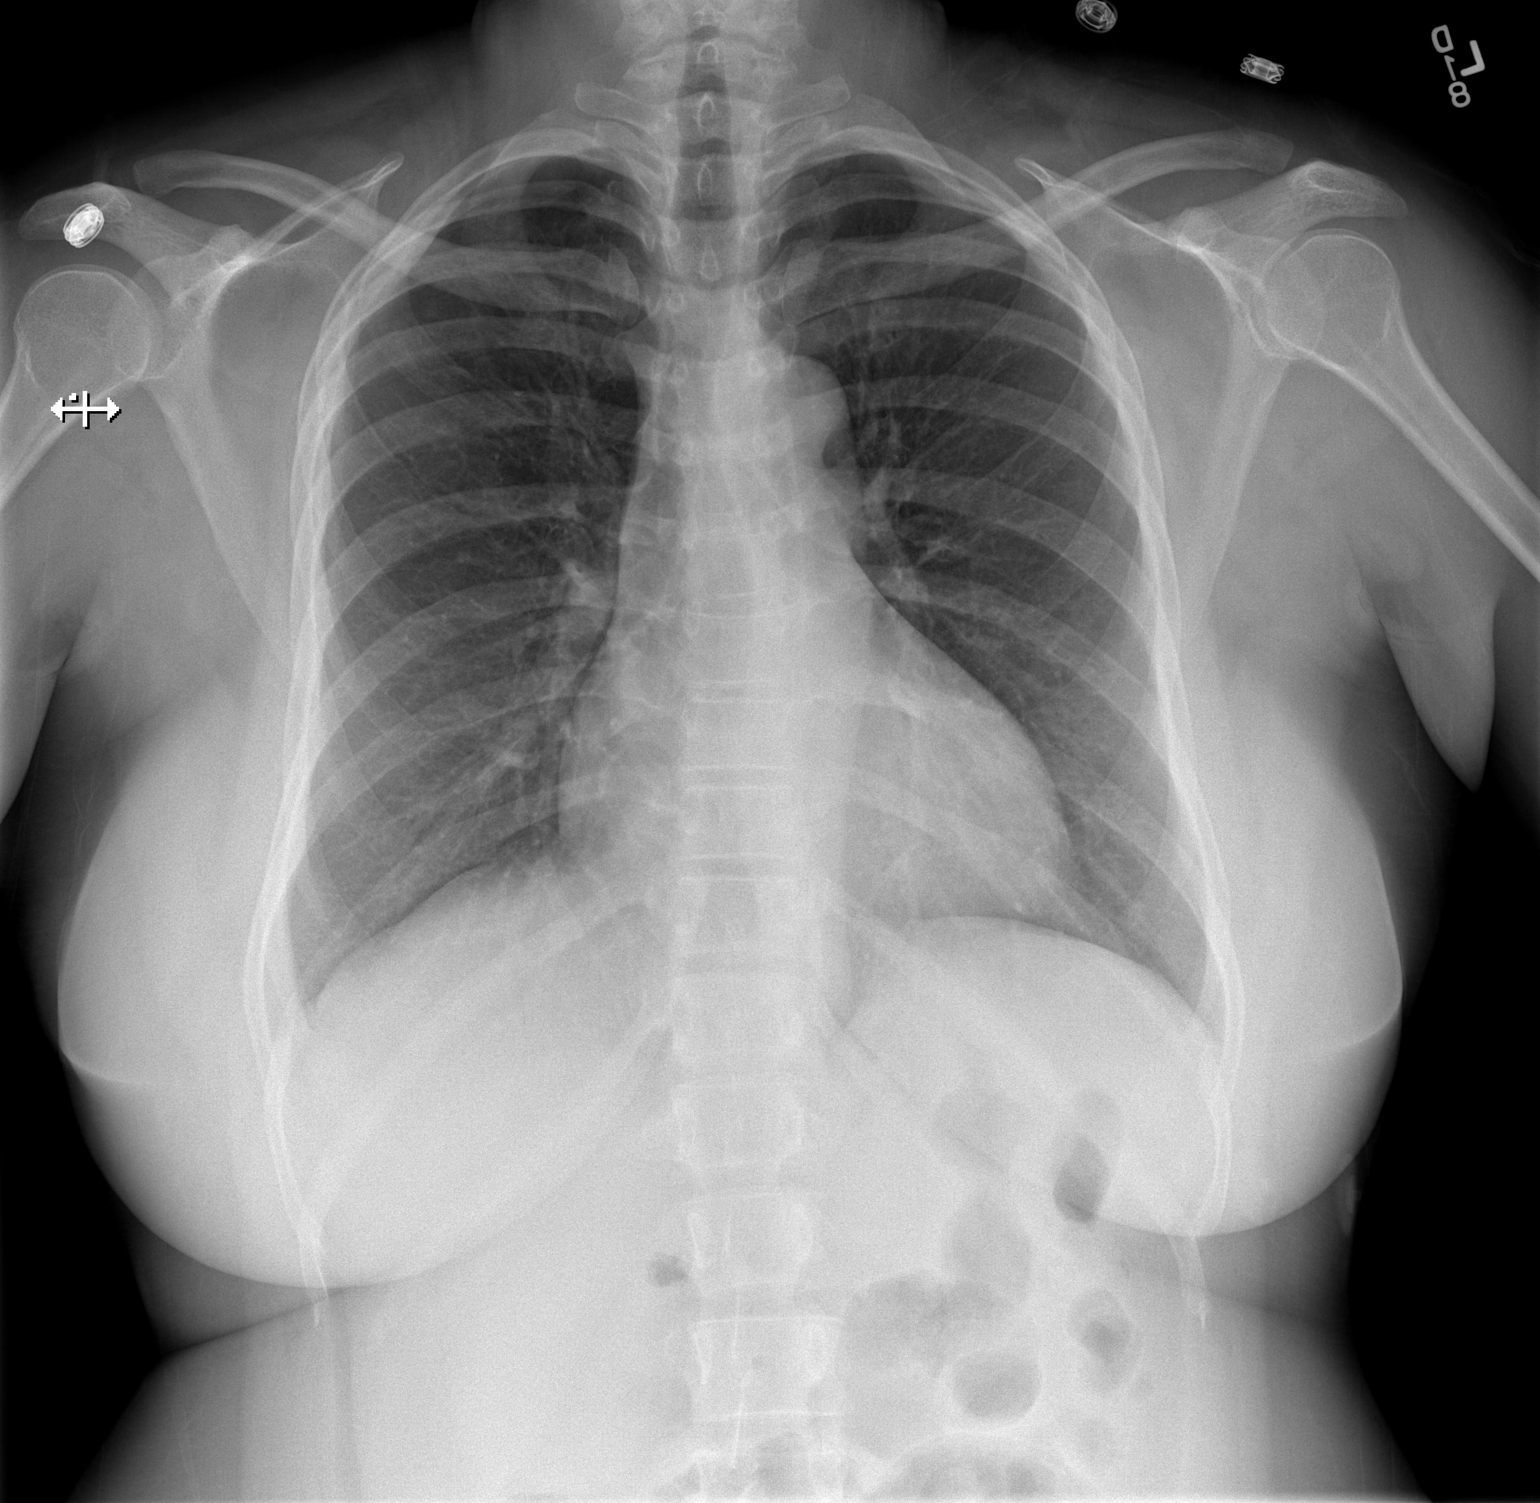

[w chest lat]
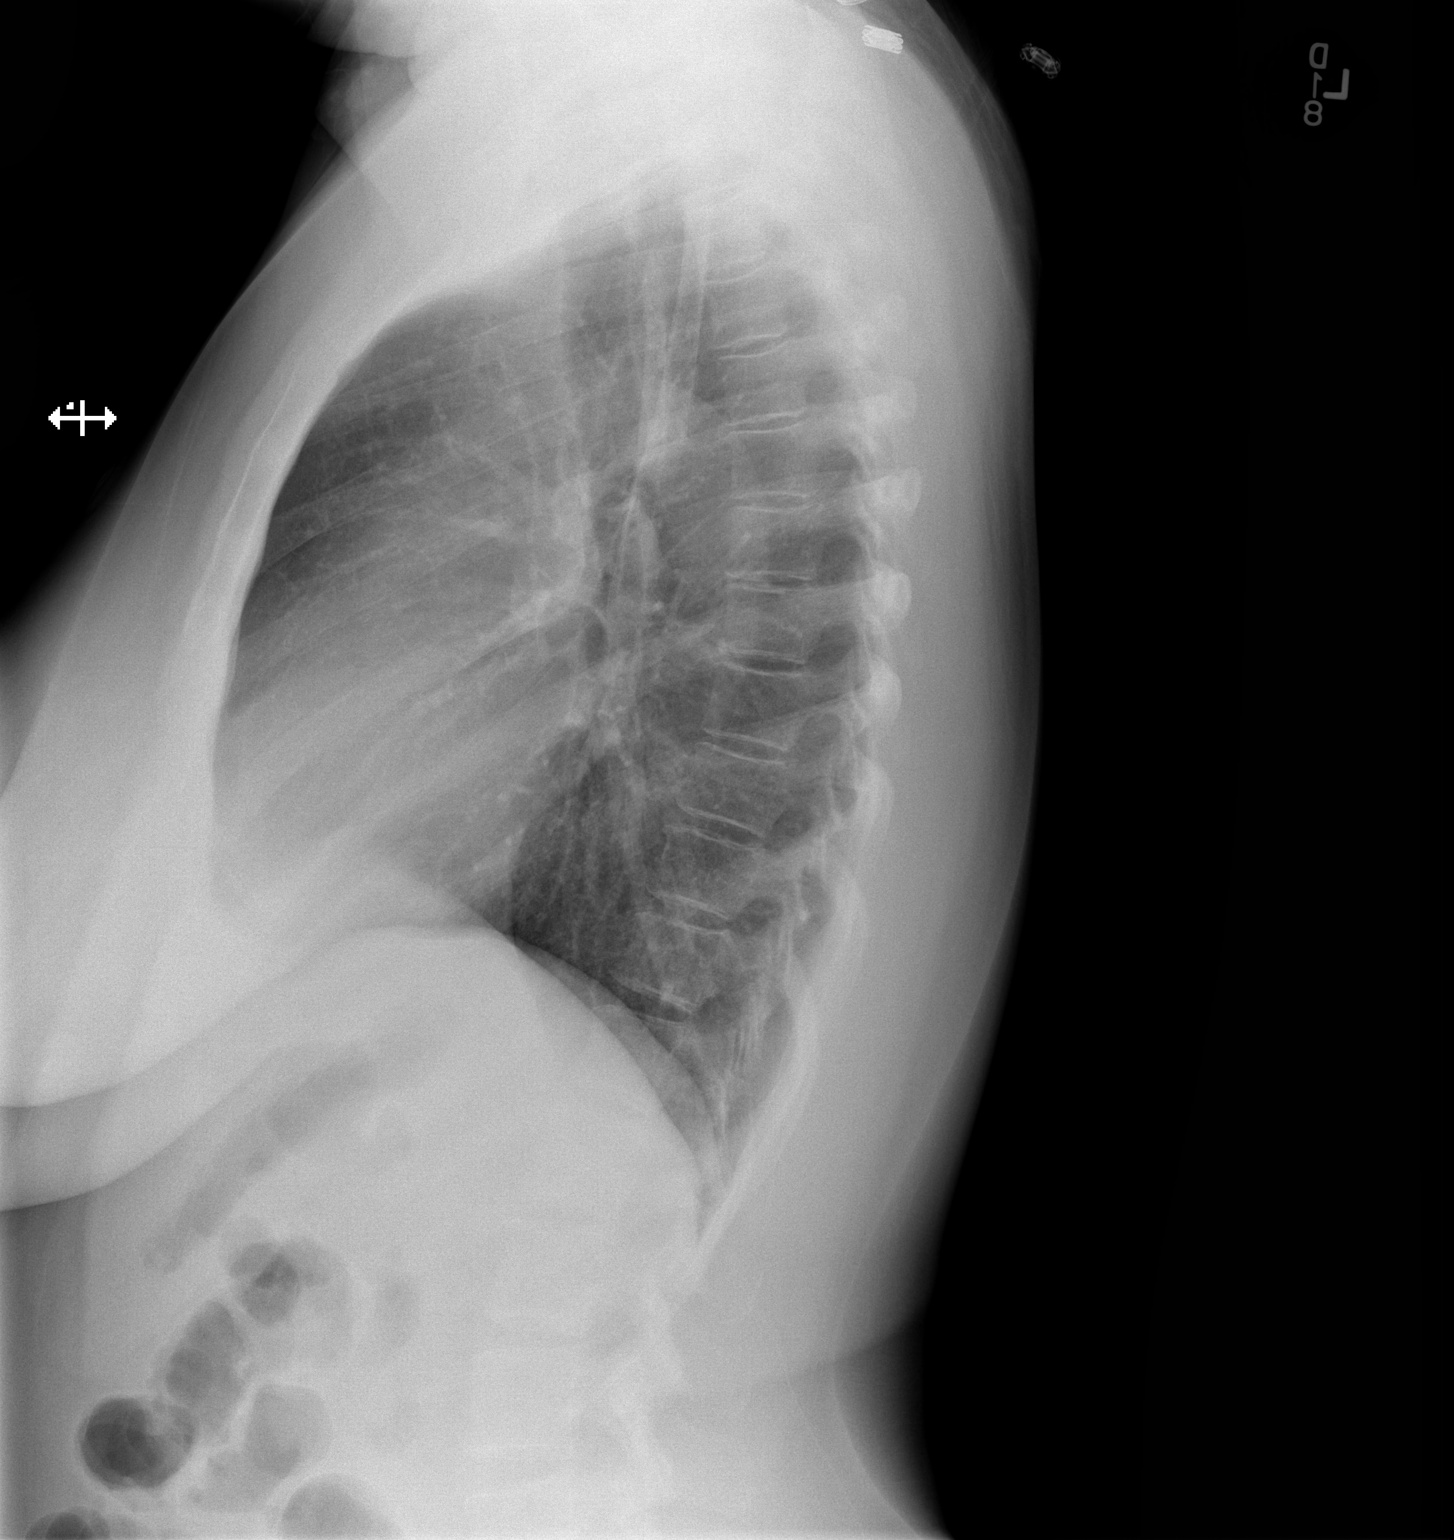

[2 of 2 positions shown; findings below may reference images not displayed]

FINDINGS: Lung volumes are normal. No consolidative airspace disease. No
pleural effusions. No pneumothorax. No pulmonary nodule or mass
noted. Pulmonary vasculature and the cardiomediastinal silhouette
are within normal limits.
IMPRESSION: No radiographic evidence of acute cardiopulmonary disease.

## 2021-11-05 IMAGING — CT CT ANGIO CHEST
3 of 7 series · 18 of 36 positions shown · IV contrast (OMNIPAQUE 350)
Comparison: 04/18/2020

CLINICAL DATA: Positive D-dimer.  Left side chest pain

EXAM:
CT ANGIOGRAPHY CHEST WITH CONTRAST
TECHNIQUE: Multidetector CT imaging of the chest was performed using the
standard protocol during bolus administration of intravenous
contrast. Multiplanar CT image reconstructions and MIPs were
obtained to evaluate the vascular anatomy.
CONTRAST:  100mL OMNIPAQUE IOHEXOL 350 MG/ML SOLN

[Series 5: thins · axial · 0.62mm/px · z∈[+129,+371]mm · 12 of 286 slices shown]
[im 22/286  lung]
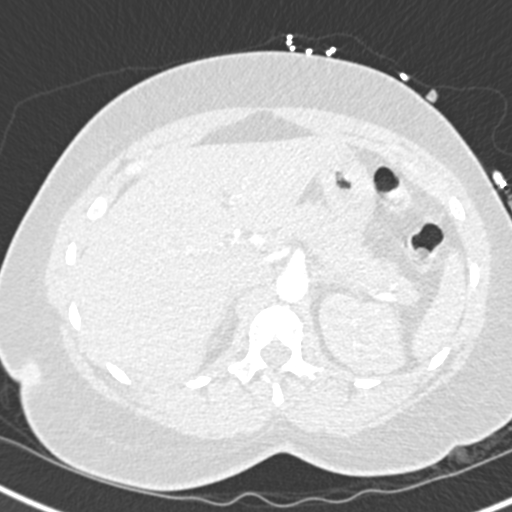
[im 44/286  mediastinal]
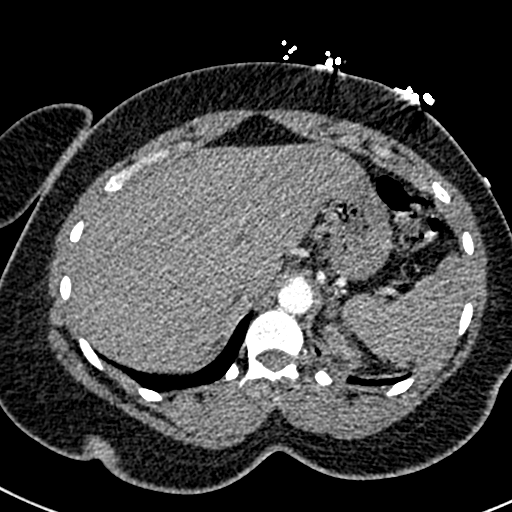
[im 66/286  lung]
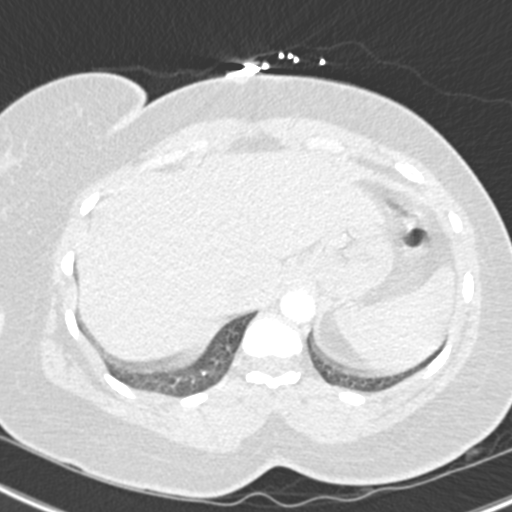
[im 88/286  mediastinal]
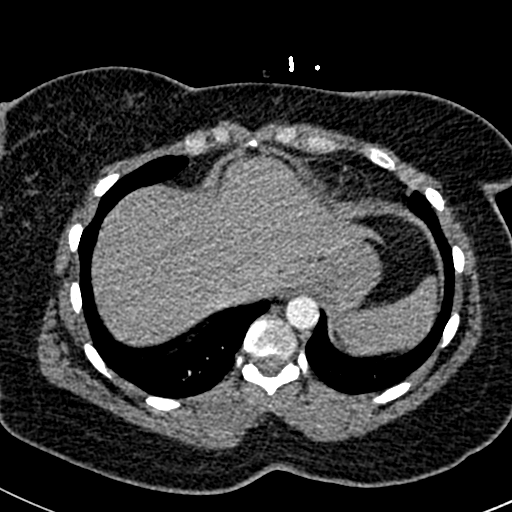
[im 110/286  lung]
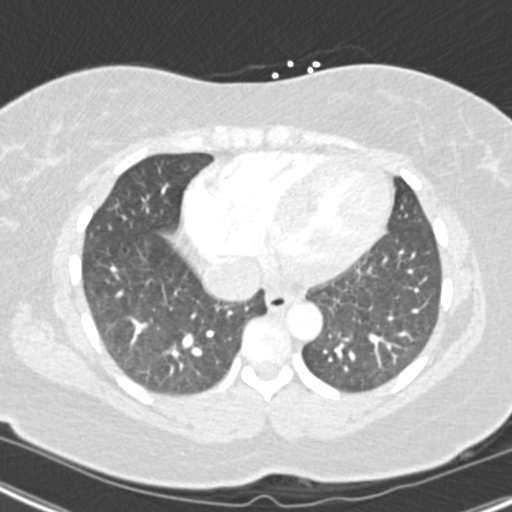
[im 132/286  mediastinal]
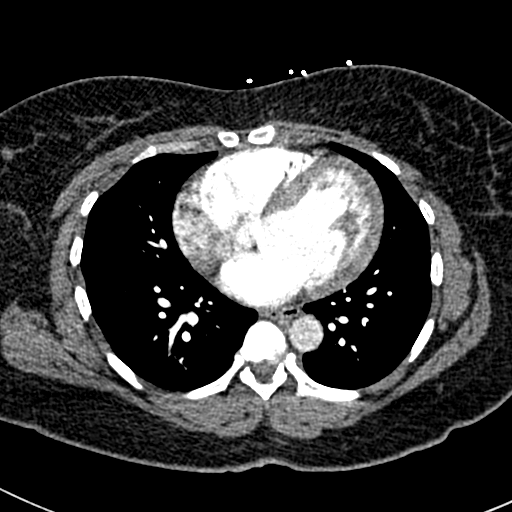
[im 154/286  lung]
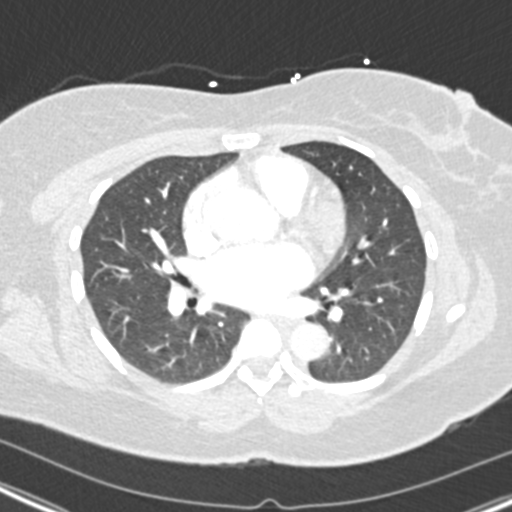
[im 176/286  mediastinal]
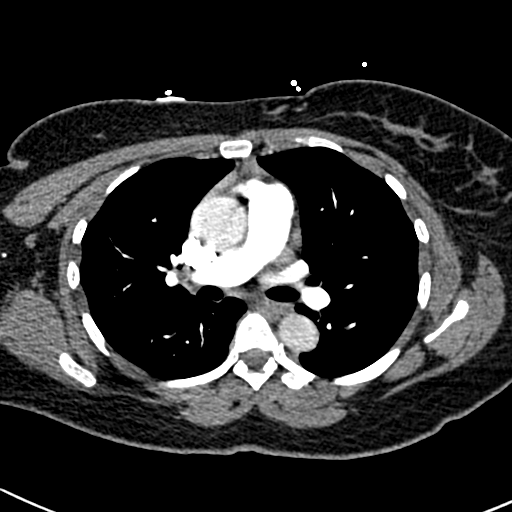
[im 198/286  lung]
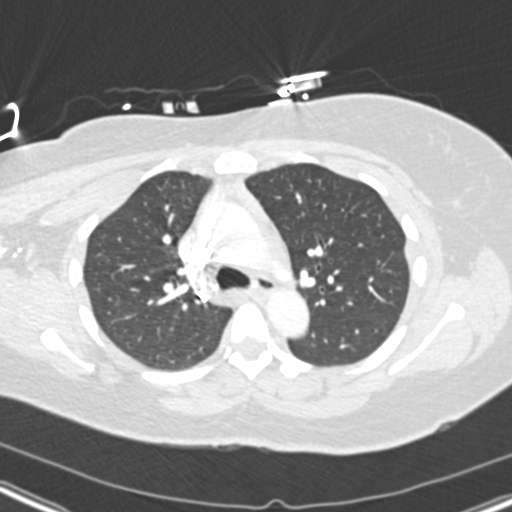
[im 220/286  mediastinal]
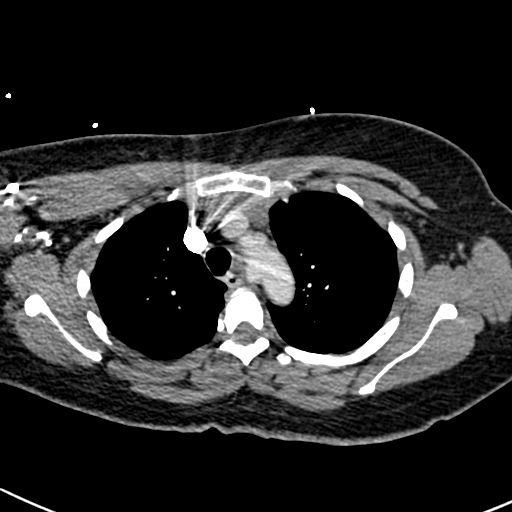
[im 242/286  lung]
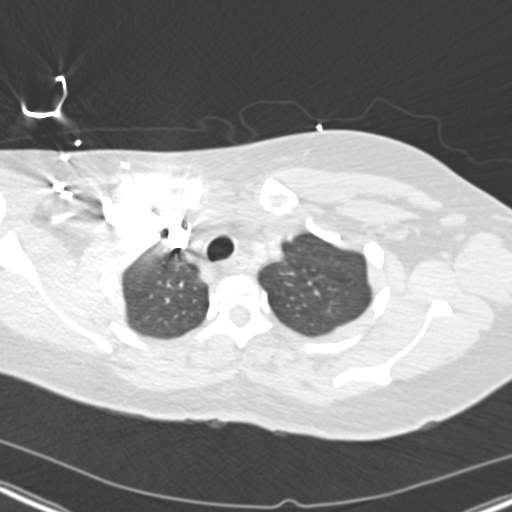
[im 264/286  mediastinal]
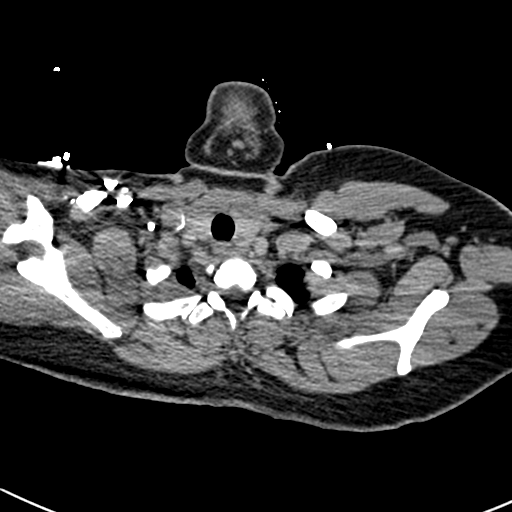

[Series 6: coronal mpr · coronal · 0.59mm/px · 1 of 151 slices shown]
[im 76/151  mediastinal]
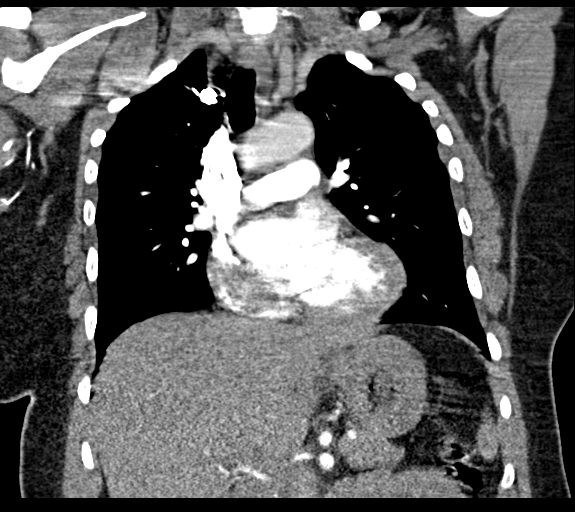

[Series 10: lung · axial · 0.62mm/px · z∈[+173,+349]mm · 5 of 133 slices shown]
[im 23/133  mediastinal]
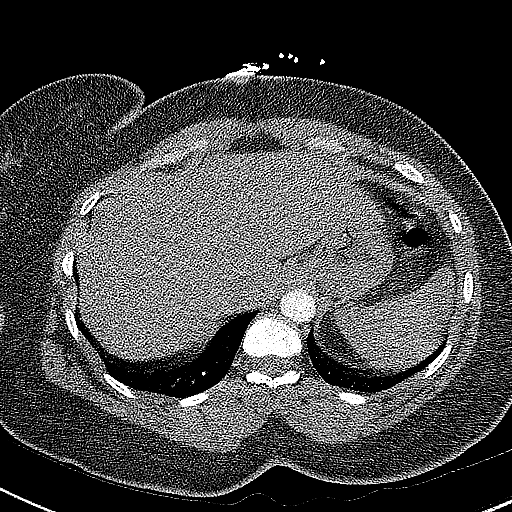
[im 45/133  mediastinal]
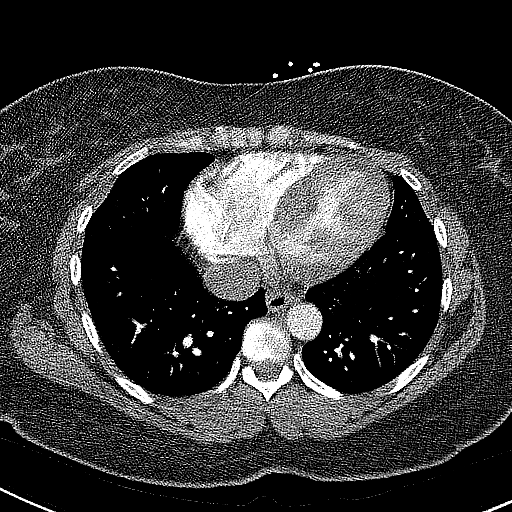
[im 67/133  mediastinal]
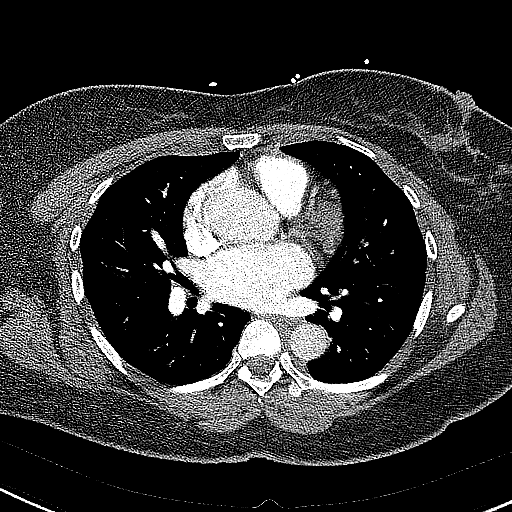
[im 89/133  mediastinal]
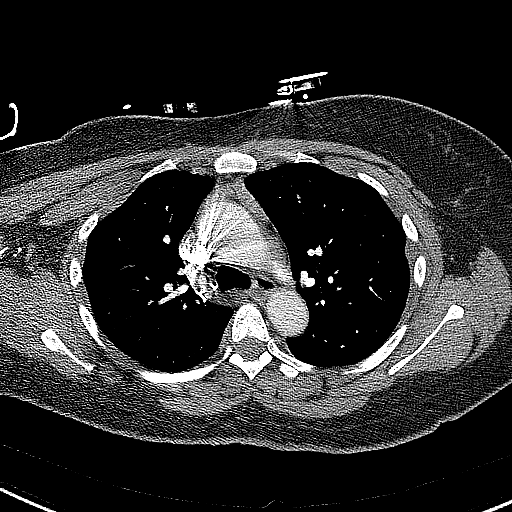
[im 111/133  mediastinal]
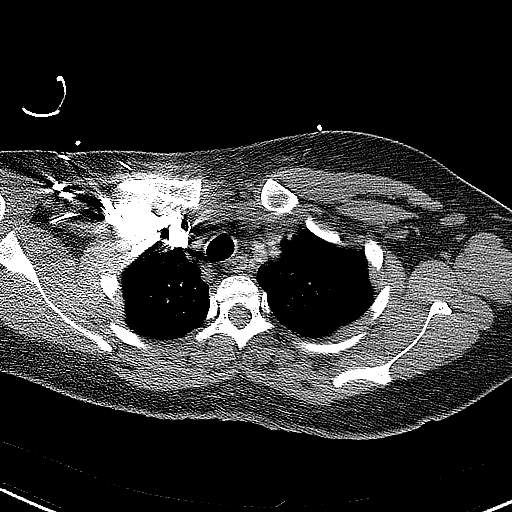

[18 of 36 positions shown; findings below may reference images not displayed]

FINDINGS: Cardiovascular: No filling defects in the pulmonary arteries to
suggest pulmonary emboli. Heart is normal size. Aorta is normal
caliber. Incidentally noted is a retroesophageal right subclavian
artery.

Mediastinum/Nodes: No mediastinal, hilar, or axillary adenopathy.
Trachea and esophagus are unremarkable. Thyroid unremarkable.

Lungs/Pleura: Lungs are clear. No focal airspace opacities or
suspicious nodules. No effusions.

Upper Abdomen: Imaging into the upper abdomen demonstrates no acute
findings.

Musculoskeletal: Chest wall soft tissues are unremarkable. No acute
bony abnormality.

Review of the MIP images confirms the above findings.
IMPRESSION: No evidence of pulmonary embolus.

No acute cardiopulmonary disease.

## 2022-10-27 ENCOUNTER — Other Ambulatory Visit: Payer: Self-pay

## 2022-10-27 ENCOUNTER — Encounter (HOSPITAL_COMMUNITY): Payer: Self-pay | Admitting: *Deleted

## 2022-10-27 ENCOUNTER — Emergency Department (HOSPITAL_COMMUNITY)
Admission: EM | Admit: 2022-10-27 | Discharge: 2022-10-28 | Disposition: A | Discharge status: Home / Self Care | Payer: 59 | Attending: Emergency Medicine | Admitting: Emergency Medicine

## 2022-10-27 DIAGNOSIS — Z87891 Personal history of nicotine dependence: Secondary | ICD-10-CM | POA: Diagnosis not present

## 2022-10-27 DIAGNOSIS — R079 Chest pain, unspecified: Secondary | ICD-10-CM | POA: Insufficient documentation

## 2022-10-27 DIAGNOSIS — I10 Essential (primary) hypertension: Secondary | ICD-10-CM | POA: Insufficient documentation

## 2022-10-27 DIAGNOSIS — Z3A16 16 weeks gestation of pregnancy: Secondary | ICD-10-CM | POA: Diagnosis not present

## 2022-10-27 DIAGNOSIS — R0789 Other chest pain: Secondary | ICD-10-CM | POA: Diagnosis not present

## 2022-10-27 DIAGNOSIS — O99512 Diseases of the respiratory system complicating pregnancy, second trimester: Secondary | ICD-10-CM | POA: Diagnosis not present

## 2022-10-27 NOTE — ED Provider Triage Note (Cosign Needed Addendum)
  Emergency Medicine Provider Triage Evaluation Note  MRN:  [id number]  Arrival date & time: 10/27/22    Medically screening exam initiated at 11:49 PM.   CC:   Chest pain  HPI:  Shannon Gonzales is a 37 y.o. year-old female presents to the ED with chief complaint of chest pain for the past few hours.  Denies SOB.  States that she has been having abdominal pain.  Abdominal pain started with the chest pain.  History provided by patient. ROS:  -As included in HPI PE:   Vitals:   10/27/22 2333  BP: 123/77  Pulse: 83  Resp: 18  Temp: 98.3 F (36.8 C)  SpO2: 100%    Non-toxic appearing No respiratory distress  MDM:  Based on signs and symptoms, chest pain and abdominal pain is highest on my differential. I've ordered labs and imaging in triage to expedite lab/diagnostic workup.  I discussed with MAU APP Shay, and we are in agreement in keeping patient on main side due to complaint.    Patient was informed that the remainder of the evaluation will be completed by another provider, this initial triage assessment does not replace that evaluation, and the importance of remaining in the ED until their evaluation is complete.    Florette, Thai, PA-C 10/27/22 2351    Roxy Horseman, PA-C 10/27/22 2356

## 2022-10-27 NOTE — ED Triage Notes (Signed)
Pt c/o chest pain for the past few hours, denies sob; also c/o abd pain that started around the same time.

## 2022-10-28 ENCOUNTER — Emergency Department (HOSPITAL_COMMUNITY): Payer: 59

## 2022-10-28 DIAGNOSIS — R079 Chest pain, unspecified: Secondary | ICD-10-CM | POA: Diagnosis not present

## 2022-10-28 LAB — HEPATIC FUNCTION PANEL
ALT: 18 U/L (ref 0–44)
AST: 28 U/L (ref 15–41)
Albumin: 2.8 g/dL — ABNORMAL LOW (ref 3.5–5.0)
Alkaline Phosphatase: 51 U/L (ref 38–126)
Bilirubin, Direct: <0.1 mg/dL (ref 0.0–0.2)
Total Bilirubin: 0.1 mg/dL — ABNORMAL LOW (ref 0.3–1.2)
Total Protein: 5.9 g/dL — ABNORMAL LOW (ref 6.5–8.1)

## 2022-10-28 LAB — CBC
HCT: 29.9 % — ABNORMAL LOW (ref 36.0–46.0)
Hemoglobin: 10.2 g/dL — ABNORMAL LOW (ref 12.0–15.0)
MCH: 31.8 pg (ref 26.0–34.0)
MCHC: 34.1 g/dL (ref 30.0–36.0)
MCV: 93.1 fL (ref 80.0–100.0)
Platelets: 250 10*3/uL (ref 150–400)
RBC: 3.21 MIL/uL — ABNORMAL LOW (ref 3.87–5.11)
RDW: 14.7 % (ref 11.5–15.5)
WBC: 13.1 10*3/uL — ABNORMAL HIGH (ref 4.0–10.5)
nRBC: 0 % (ref 0.0–0.2)

## 2022-10-28 LAB — BASIC METABOLIC PANEL
Anion gap: 9 (ref 5–15)
BUN: 7 mg/dL (ref 6–20)
CO2: 21 mmol/L — ABNORMAL LOW (ref 22–32)
Calcium: 8.6 mg/dL — ABNORMAL LOW (ref 8.9–10.3)
Chloride: 108 mmol/L (ref 98–111)
Creatinine, Ser: 0.62 mg/dL (ref 0.44–1.00)
GFR, Estimated: >60 mL/min (ref >60)
Glucose, Bld: 92 mg/dL (ref 70–99)
Potassium: 3.5 mmol/L (ref 3.5–5.1)
Sodium: 138 mmol/L (ref 135–145)

## 2022-10-28 LAB — HCG, QUANTITATIVE, PREGNANCY: hCG, Beta Chain, Quant, S: 12990 m[IU]/mL — ABNORMAL HIGH (ref <5)

## 2022-10-28 LAB — TROPONIN I (HIGH SENSITIVITY)
Troponin I (High Sensitivity): 3 ng/L (ref <18)
Troponin I (High Sensitivity): 4 ng/L (ref <18)

## 2022-10-28 NOTE — Discharge Instructions (Addendum)
You were evaluated in the Emergency Department and after careful evaluation, we did not find any emergent condition requiring admission or further testing in the hospital.  Your exam/testing today is overall reassuring.  Suspect your pain is related to acid reflux.  Recommend continued use of Tums, small meals especially at night.  Try not to eat right before bed.  Please return to the Emergency Department if you experience any worsening of your condition.   Thank you for allowing [place] to be a part of your care.

## 2022-10-28 NOTE — ED Provider Notes (Signed)
[facility] Emergency Department Provider Note MRN:  [id number]  Arrival date & time: 10/28/22     Chief Complaint   Chest Pain   History of Present Illness   Shannon Gonzales is a 37 y.o. year-old female with a history of hypertension presenting to the ED with chief complaint of chest pain.  Pain in the epigastrium and chest intermittently over the past several days.  Worse when laying flat.  Feels like a burning sensation.  This evening was a bit more severe and felt more like a pressure.  [redacted] weeks pregnant.  Denies shortness of breath, no leg pain or swelling, no other complaints.  Pain is currently not present, has resolved.  Review of Systems  A thorough review of systems was obtained and all systems are negative except as noted in the HPI and PMH.   Patient's Health History    Past Medical History:  Diagnosis Date   Anxiety    Depression    Drug abuse, marijuana    with pregnancy   Hx of PTL (preterm labor), current pregnancy    Hypertension    Late prenatal care    Trichimoniasis    with pregnancy    Past Surgical History:  Procedure Laterality Date   CESAREAN SECTION      Family History  Problem Relation Age of Onset   Hypertension Mother     Social History   Socioeconomic History   Marital status: Single    Spouse name: Not on file   Number of children: Not on file   Years of education: Not on file   Highest education level: Not on file  Occupational History   Not on file  Tobacco Use   Smoking status: Former    Packs/day: .1    Types: Cigarettes   Smokeless tobacco: Never  Vaping Use   Vaping Use: Never used  Substance and Sexual Activity   Alcohol use: No   Drug use: Not Currently    Types: Hashish, Marijuana   Sexual activity: Yes    Birth control/protection: None  Other Topics Concern   Not on file  Social History Narrative   Not on file   Social Determinants of Health   Financial Resource Strain: Not on file   Food Insecurity: Not on file  Transportation Needs: Not on file  Physical Activity: Not on file  Stress: Not on file  Social Connections: Not on file  Intimate Partner Violence: Not on file     Physical Exam   Vitals:   10/27/22 2333 10/28/22 0300  BP: 123/77 119/76  Pulse: 83 71  Resp: 18 20  Temp: 98.3 F (36.8 C)   SpO2: 100% 100%    CONSTITUTIONAL: Well-appearing, NAD NEURO/PSYCH:  Alert and oriented x 3, no focal deficits EYES:  eyes equal and reactive ENT/NECK:  no LAD, no JVD CARDIO: Regular rate, well-perfused, normal S1 and S2 PULM:  CTAB no wheezing or rhonchi GI/GU:  non-distended, non-tender MSK/SPINE:  No gross deformities, no edema SKIN:  no rash, atraumatic   *Additional and/or pertinent findings included in MDM below  Diagnostic and Interventional Summary    EKG Interpretation  Date/Time:  Saturday October 28 2022 00:15:30 EDT Ventricular Rate:  80 PR Interval:  150 QRS Duration: 78 QT Interval:  380 QTC Calculation: 438 R Axis:   132 Text Interpretation: Normal sinus rhythm Lateral infarct , age undetermined Possible Inferior infarct , age undetermined Abnormal ECG When compared with ECG of 29-Sep-2020  18:27, PREVIOUS ECG IS PRESENT Confirmed by Kennis Carina [phone]) on 10/28/2022 2:51:45 AM       Labs Reviewed  BASIC METABOLIC PANEL - Abnormal; Notable for the following components:      Result Value   CO2 21 (*)    Calcium 8.6 (*)    All other components within normal limits  CBC - Abnormal; Notable for the following components:   WBC 13.1 (*)    RBC 3.21 (*)    Hemoglobin 10.2 (*)    HCT 29.9 (*)    All other components within normal limits  HEPATIC FUNCTION PANEL - Abnormal; Notable for the following components:   Total Protein 5.9 (*)    Albumin 2.8 (*)    Total Bilirubin 0.1 (*)    All other components within normal limits  HCG, QUANTITATIVE, PREGNANCY - Abnormal; Notable for the following components:   hCG, Beta Chain, Quant, S  12,990 (*)    All other components within normal limits  TROPONIN I (HIGH SENSITIVITY)  TROPONIN I (HIGH SENSITIVITY)    US OB Limited  Final Result    DG Chest 2 View  Final Result      Medications - No data to display   Procedures  /  Critical Care Procedures  ED Course and Medical Decision Making  Initial Impression and Ddx History is highly suggestive of GERD in the setting of pregnancy.  Very reassuring vital signs, no evidence of DVT on exam, concern for PE is very low despite her increased risk in the setting of pregnancy.  Especially reassuring is that her symptoms have resolved, she has no increased work of breathing.  Past medical/surgical history that increases complexity of ED encounter: [redacted] weeks pregnant  Interpretation of Diagnostics I personally reviewed the EKG and my interpretation is as follows: Sinus rhythm without concerning ischemic findings  Labs reassuring with no significant blood count or electrolyte disturbance.  Troponin negative x 2.  Patient Reassessment and Ultimate Disposition/Management     With reassuring workup patient is appropriate for discharge, return precautions for any worsening symptoms.  Patient management required discussion with the following services or consulting groups:  None  Complexity of Problems Addressed Acute illness or injury that poses threat of life of bodily function  Additional Data Reviewed and Analyzed Further history obtained from: Prior labs/imaging results  Additional Factors Impacting ED Encounter Risk None  Elmer Sow. Pilar Plate, MD [facility] Emergency Medicine [facility] [email or web address]  Final Clinical Impressions(s) / ED Diagnoses     ICD-10-CM   1. Chest pain, unspecified type  R07.9       ED Discharge Orders     None        Discharge Instructions Discussed with and Provided to Patient:    Discharge Instructions      You were evaluated in the Emergency Department  and after careful evaluation, we did not find any emergent condition requiring admission or further testing in the hospital.  Your exam/testing today is overall reassuring.  Suspect your pain is related to acid reflux.  Recommend continued use of Tums, small meals especially at night.  Try not to eat right before bed.  Please return to the Emergency Department if you experience any worsening of your condition.   Thank you for allowing [place] to be a part of your care.      Sabas Sous, MD 10/28/22 [phone]

## 2022-12-08 DIAGNOSIS — O0990 Supervision of high risk pregnancy, unspecified, unspecified trimester: Secondary | ICD-10-CM | POA: Insufficient documentation

## 2022-12-11 ENCOUNTER — Encounter: Payer: 59 | Admitting: Obstetrics and Gynecology

## 2022-12-11 DIAGNOSIS — O0990 Supervision of high risk pregnancy, unspecified, unspecified trimester: Secondary | ICD-10-CM

## 2022-12-19 ENCOUNTER — Encounter (HOSPITAL_COMMUNITY)
Admission: AD | Disposition: A | Discharge status: Home / Self Care | Payer: Self-pay | Source: Home / Self Care | Attending: Obstetrics and Gynecology

## 2022-12-19 ENCOUNTER — Other Ambulatory Visit: Payer: Self-pay

## 2022-12-19 ENCOUNTER — Inpatient Hospital Stay (HOSPITAL_BASED_OUTPATIENT_CLINIC_OR_DEPARTMENT_OTHER): Payer: 59

## 2022-12-19 ENCOUNTER — Encounter (HOSPITAL_COMMUNITY): Payer: Self-pay | Admitting: *Deleted

## 2022-12-19 ENCOUNTER — Inpatient Hospital Stay (HOSPITAL_COMMUNITY): Payer: 59 | Admitting: Anesthesiology

## 2022-12-19 ENCOUNTER — Inpatient Hospital Stay (HOSPITAL_COMMUNITY)
Admission: AD | Admit: 2022-12-19 | Discharge: 2022-12-21 | DRG: 787 | Disposition: A | Discharge status: Home / Self Care | Payer: 59 | Attending: Obstetrics and Gynecology | Admitting: Obstetrics and Gynecology

## 2022-12-19 DIAGNOSIS — O134 Gestational [pregnancy-induced] hypertension without significant proteinuria, complicating childbirth: Secondary | ICD-10-CM | POA: Diagnosis not present

## 2022-12-19 DIAGNOSIS — O99344 Other mental disorders complicating childbirth: Secondary | ICD-10-CM

## 2022-12-19 DIAGNOSIS — J80 Acute respiratory distress syndrome: Secondary | ICD-10-CM | POA: Diagnosis not present

## 2022-12-19 DIAGNOSIS — Z87891 Personal history of nicotine dependence: Secondary | ICD-10-CM

## 2022-12-19 DIAGNOSIS — O039 Complete or unspecified spontaneous abortion without complication: Secondary | ICD-10-CM | POA: Diagnosis not present

## 2022-12-19 DIAGNOSIS — O164 Unspecified maternal hypertension, complicating childbirth: Secondary | ICD-10-CM | POA: Diagnosis not present

## 2022-12-19 DIAGNOSIS — O34211 Maternal care for low transverse scar from previous cesarean delivery: Secondary | ICD-10-CM | POA: Diagnosis not present

## 2022-12-19 DIAGNOSIS — Z3A23 23 weeks gestation of pregnancy: Secondary | ICD-10-CM

## 2022-12-19 DIAGNOSIS — F418 Other specified anxiety disorders: Secondary | ICD-10-CM

## 2022-12-19 DIAGNOSIS — Z0389 Encounter for observation for other suspected diseases and conditions ruled out: Secondary | ICD-10-CM | POA: Diagnosis not present

## 2022-12-19 DIAGNOSIS — O4592 Premature separation of placenta, unspecified, second trimester: Secondary | ICD-10-CM

## 2022-12-19 DIAGNOSIS — F1290 Cannabis use, unspecified, uncomplicated: Secondary | ICD-10-CM | POA: Diagnosis present

## 2022-12-19 DIAGNOSIS — O4590 Premature separation of placenta, unspecified, unspecified trimester: Secondary | ICD-10-CM | POA: Diagnosis present

## 2022-12-19 DIAGNOSIS — R451 Restlessness and agitation: Secondary | ICD-10-CM | POA: Diagnosis not present

## 2022-12-19 DIAGNOSIS — O114 Pre-existing hypertension with pre-eclampsia, complicating childbirth: Secondary | ICD-10-CM | POA: Diagnosis present

## 2022-12-19 DIAGNOSIS — O4593 Premature separation of placenta, unspecified, third trimester: Secondary | ICD-10-CM | POA: Diagnosis not present

## 2022-12-19 DIAGNOSIS — I10 Essential (primary) hypertension: Secondary | ICD-10-CM | POA: Diagnosis not present

## 2022-12-19 DIAGNOSIS — O1092 Unspecified pre-existing hypertension complicating childbirth: Secondary | ICD-10-CM | POA: Diagnosis present

## 2022-12-19 DIAGNOSIS — I959 Hypotension, unspecified: Secondary | ICD-10-CM | POA: Diagnosis not present

## 2022-12-19 DIAGNOSIS — R1084 Generalized abdominal pain: Secondary | ICD-10-CM | POA: Diagnosis not present

## 2022-12-19 DIAGNOSIS — Z3A26 26 weeks gestation of pregnancy: Secondary | ICD-10-CM

## 2022-12-19 DIAGNOSIS — F1491 Cocaine use, unspecified, in remission: Secondary | ICD-10-CM | POA: Diagnosis not present

## 2022-12-19 DIAGNOSIS — Z452 Encounter for adjustment and management of vascular access device: Secondary | ICD-10-CM | POA: Diagnosis not present

## 2022-12-19 DIAGNOSIS — Z98891 History of uterine scar from previous surgery: Principal | ICD-10-CM

## 2022-12-19 DIAGNOSIS — O99324 Drug use complicating childbirth: Secondary | ICD-10-CM | POA: Diagnosis not present

## 2022-12-19 DIAGNOSIS — R918 Other nonspecific abnormal finding of lung field: Secondary | ICD-10-CM | POA: Diagnosis not present

## 2022-12-19 DIAGNOSIS — Z051 Observation and evaluation of newborn for suspected infectious condition ruled out: Secondary | ICD-10-CM | POA: Diagnosis not present

## 2022-12-19 LAB — COMPREHENSIVE METABOLIC PANEL
ALT: 8 U/L (ref 0–44)
AST: 22 U/L (ref 15–41)
Albumin: 2.5 g/dL — ABNORMAL LOW (ref 3.5–5.0)
Alkaline Phosphatase: 62 U/L (ref 38–126)
Anion gap: 8 (ref 5–15)
BUN: <5 mg/dL — ABNORMAL LOW (ref 6–20)
CO2: 21 mmol/L — ABNORMAL LOW (ref 22–32)
Calcium: 8.6 mg/dL — ABNORMAL LOW (ref 8.9–10.3)
Chloride: 106 mmol/L (ref 98–111)
Creatinine, Ser: 0.52 mg/dL (ref 0.44–1.00)
GFR, Estimated: >60 mL/min (ref >60)
Glucose, Bld: 115 mg/dL — ABNORMAL HIGH (ref 70–99)
Potassium: 3.1 mmol/L — ABNORMAL LOW (ref 3.5–5.1)
Sodium: 135 mmol/L (ref 135–145)
Total Bilirubin: 0.2 mg/dL — ABNORMAL LOW (ref 0.3–1.2)
Total Protein: 5.7 g/dL — ABNORMAL LOW (ref 6.5–8.1)

## 2022-12-19 LAB — CBC WITH DIFFERENTIAL/PLATELET
Abs Immature Granulocytes: 0.06 10*3/uL (ref 0.00–0.07)
Basophils Absolute: 0 10*3/uL (ref 0.0–0.1)
Basophils Relative: 0 %
Eosinophils Absolute: 0.5 10*3/uL (ref 0.0–0.5)
Eosinophils Relative: 3 %
HCT: 28.2 % — ABNORMAL LOW (ref 36.0–46.0)
Hemoglobin: 9.3 g/dL — ABNORMAL LOW (ref 12.0–15.0)
Immature Granulocytes: 0 %
Lymphocytes Relative: 14 %
Lymphs Abs: 2.1 10*3/uL (ref 0.7–4.0)
MCH: 30.1 pg (ref 26.0–34.0)
MCHC: 33 g/dL (ref 30.0–36.0)
MCV: 91.3 fL (ref 80.0–100.0)
Monocytes Absolute: 0.8 10*3/uL (ref 0.1–1.0)
Monocytes Relative: 5 %
Neutro Abs: 11.6 10*3/uL — ABNORMAL HIGH (ref 1.7–7.7)
Neutrophils Relative %: 78 %
Platelets: 211 10*3/uL (ref 150–400)
RBC: 3.09 MIL/uL — ABNORMAL LOW (ref 3.87–5.11)
RDW: 14.4 % (ref 11.5–15.5)
WBC: 15.1 10*3/uL — ABNORMAL HIGH (ref 4.0–10.5)
nRBC: 0 % (ref 0.0–0.2)

## 2022-12-19 LAB — TYPE AND SCREEN
ABO/RH(D): B POS
Antibody Screen: NEGATIVE

## 2022-12-19 LAB — PROTEIN / CREATININE RATIO, URINE
Creatinine, Urine: 60 mg/dL
Protein Creatinine Ratio: 2.28 mg/mg{Cre} — ABNORMAL HIGH (ref 0.00–0.15)
Total Protein, Urine: 137 mg/dL

## 2022-12-19 LAB — RAPID URINE DRUG SCREEN, HOSP PERFORMED
Amphetamines: NOT DETECTED
Barbiturates: NOT DETECTED
Benzodiazepines: POSITIVE — AB
Cocaine: NOT DETECTED
Opiates: POSITIVE — AB
Tetrahydrocannabinol: POSITIVE — AB

## 2022-12-19 LAB — DIC (DISSEMINATED INTRAVASCULAR COAGULATION)PANEL
D-Dimer, Quant: >20 ug/mL-FEU — ABNORMAL HIGH (ref 0.00–0.50)
Fibrinogen: 311 mg/dL (ref 210–475)
INR: 1.1 (ref 0.8–1.2)
Platelets: 200 10*3/uL (ref 150–400)
Prothrombin Time: 14 seconds (ref 11.4–15.2)
Smear Review: NONE SEEN
aPTT: 29 seconds (ref 24–36)

## 2022-12-19 LAB — RAPID HIV SCREEN (HIV 1/2 AB+AG)
HIV 1/2 Antibodies: NONREACTIVE
HIV-1 P24 Antigen - HIV24: NONREACTIVE

## 2022-12-19 LAB — HEPATITIS B SURFACE ANTIGEN: Hepatitis B Surface Ag: NONREACTIVE

## 2022-12-19 SURGERY — Surgical Case
Anesthesia: General

## 2022-12-19 MED ORDER — MAGNESIUM HYDROXIDE 400 MG/5ML PO SUSP: 30.0000 mL | ORAL | Status: DC | PRN

## 2022-12-19 MED ORDER — MEDROXYPROGESTERONE ACETATE 150 MG/ML IM SUSP: 150.0000 mg | INTRAMUSCULAR | Status: DC | PRN

## 2022-12-19 MED ORDER — MAGNESIUM SULFATE 40 GM/1000ML IV SOLN
INTRAVENOUS | Status: AC
  Filled 2022-12-19: qty 1000

## 2022-12-19 MED ORDER — SIMETHICONE 80 MG PO CHEW: 80.0000 mg | CHEWABLE_TABLET | ORAL | Status: DC | PRN

## 2022-12-19 MED ORDER — HYDROMORPHONE HCL 1 MG/ML IJ SOLN
INTRAMUSCULAR | Status: AC
  Filled 2022-12-19: qty 0.5

## 2022-12-19 MED ORDER — KETAMINE HCL 50 MG/5ML IJ SOSY
PREFILLED_SYRINGE | INTRAMUSCULAR | Status: AC
  Filled 2022-12-19: qty 5

## 2022-12-19 MED ORDER — OXYTOCIN-SODIUM CHLORIDE 30-0.9 UT/500ML-% IV SOLN: 2.5000 [IU]/h | INTRAVENOUS | Status: AC

## 2022-12-19 MED ORDER — AMISULPRIDE (ANTIEMETIC) 5 MG/2ML IV SOLN: 10.0000 mg | Freq: Once | INTRAVENOUS | Status: DC | PRN

## 2022-12-19 MED ORDER — HYDRALAZINE HCL 20 MG/ML IJ SOLN: 10.0000 mg | INTRAMUSCULAR | Status: DC | PRN

## 2022-12-19 MED ORDER — HYDROMORPHONE HCL 1 MG/ML IJ SOLN
1.0000 mg | INTRAMUSCULAR | Status: DC | PRN
  Administered 2022-12-19 – 2022-12-20 (×2): 1 mg via INTRAVENOUS
  Filled 2022-12-19 (×2): qty 1

## 2022-12-19 MED ORDER — MIDAZOLAM HCL 2 MG/2ML IJ SOLN
INTRAMUSCULAR | Status: DC | PRN
  Administered 2022-12-19: 2 mg via INTRAVENOUS

## 2022-12-19 MED ORDER — HYDROMORPHONE HCL 1 MG/ML IJ SOLN
INTRAMUSCULAR | Status: AC
  Filled 2022-12-19: qty 1

## 2022-12-19 MED ORDER — ACETAMINOPHEN 10 MG/ML IV SOLN
INTRAVENOUS | Status: AC
  Filled 2022-12-19: qty 100

## 2022-12-19 MED ORDER — OXYCODONE HCL 5 MG PO TABS
5.0000 mg | ORAL_TABLET | ORAL | Status: DC | PRN
  Administered 2022-12-20: 10 mg via ORAL
  Administered 2022-12-20: 5 mg via ORAL
  Administered 2022-12-20 – 2022-12-21 (×3): 10 mg via ORAL
  Administered 2022-12-21: 5 mg via ORAL
  Filled 2022-12-19: qty 1
  Filled 2022-12-19 (×6): qty 2

## 2022-12-19 MED ORDER — KETOROLAC TROMETHAMINE 30 MG/ML IJ SOLN
30.0000 mg | Freq: Four times a day (QID) | INTRAMUSCULAR | Status: AC
  Administered 2022-12-20 (×4): 30 mg via INTRAVENOUS
  Filled 2022-12-19 (×4): qty 1

## 2022-12-19 MED ORDER — DEXAMETHASONE SODIUM PHOSPHATE 10 MG/ML IJ SOLN
INTRAMUSCULAR | Status: AC
  Filled 2022-12-19: qty 1

## 2022-12-19 MED ORDER — SENNOSIDES-DOCUSATE SODIUM 8.6-50 MG PO TABS
2.0000 | ORAL_TABLET | Freq: Every day | ORAL | Status: DC
  Administered 2022-12-20 – 2022-12-21 (×2): 2 via ORAL
  Filled 2022-12-19 (×2): qty 2

## 2022-12-19 MED ORDER — NIFEDIPINE 10 MG PO CAPS: 20.0000 mg | ORAL_CAPSULE | ORAL | Status: DC | PRN

## 2022-12-19 MED ORDER — DIPHENHYDRAMINE HCL 25 MG PO CAPS
25.0000 mg | ORAL_CAPSULE | Freq: Four times a day (QID) | ORAL | Status: DC | PRN
  Administered 2022-12-20: 25 mg via ORAL

## 2022-12-19 MED ORDER — ONDANSETRON HCL 4 MG/2ML IJ SOLN: 4.0000 mg | Freq: Once | INTRAMUSCULAR | Status: DC | PRN

## 2022-12-19 MED ORDER — LABETALOL HCL 5 MG/ML IV SOLN: 40.0000 mg | INTRAVENOUS | Status: DC | PRN

## 2022-12-19 MED ORDER — OXYTOCIN-SODIUM CHLORIDE 30-0.9 UT/500ML-% IV SOLN
INTRAVENOUS | Status: AC
  Filled 2022-12-19: qty 500

## 2022-12-19 MED ORDER — SCOPOLAMINE 1 MG/3DAYS TD PT72
MEDICATED_PATCH | TRANSDERMAL | Status: AC
  Filled 2022-12-19: qty 1

## 2022-12-19 MED ORDER — FENTANYL CITRATE (PF) 100 MCG/2ML IJ SOLN
INTRAMUSCULAR | Status: AC
  Filled 2022-12-19: qty 2

## 2022-12-19 MED ORDER — LACTATED RINGERS IV SOLN: INTRAVENOUS | Status: DC

## 2022-12-19 MED ORDER — HYDROMORPHONE HCL 1 MG/ML IJ SOLN
0.2500 mg | INTRAMUSCULAR | Status: DC | PRN
  Administered 2022-12-19 (×2): 0.5 mg via INTRAVENOUS

## 2022-12-19 MED ORDER — ONDANSETRON HCL 4 MG/2ML IJ SOLN
4.0000 mg | Freq: Three times a day (TID) | INTRAMUSCULAR | Status: DC | PRN
  Administered 2022-12-20: 4 mg via INTRAVENOUS
  Filled 2022-12-19: qty 2

## 2022-12-19 MED ORDER — ALBUMIN HUMAN 5 % IV SOLN: INTRAVENOUS | Status: DC | PRN

## 2022-12-19 MED ORDER — LABETALOL HCL 5 MG/ML IV SOLN: 80.0000 mg | INTRAVENOUS | Status: DC | PRN

## 2022-12-19 MED ORDER — HYDROMORPHONE HCL 1 MG/ML IJ SOLN
INTRAMUSCULAR | Status: DC | PRN
  Administered 2022-12-19: 1 mg via INTRAVENOUS

## 2022-12-19 MED ORDER — MEPERIDINE HCL 25 MG/ML IJ SOLN: 6.2500 mg | INTRAMUSCULAR | Status: DC | PRN

## 2022-12-19 MED ORDER — SCOPOLAMINE 1 MG/3DAYS TD PT72
1.0000 | MEDICATED_PATCH | Freq: Once | TRANSDERMAL | Status: DC
  Administered 2022-12-19: 1.5 mg via TRANSDERMAL

## 2022-12-19 MED ORDER — LACTATED RINGERS IV SOLN: INTRAVENOUS | Status: DC | PRN

## 2022-12-19 MED ORDER — OXYCODONE HCL 5 MG PO TABS
ORAL_TABLET | ORAL | Status: AC
  Filled 2022-12-19: qty 1

## 2022-12-19 MED ORDER — COCONUT OIL OIL: 1.0000 | TOPICAL_OIL | Status: DC | PRN

## 2022-12-19 MED ORDER — KETOROLAC TROMETHAMINE 30 MG/ML IJ SOLN
30.0000 mg | Freq: Four times a day (QID) | INTRAMUSCULAR | Status: DC | PRN
  Administered 2022-12-19: 30 mg via INTRAVENOUS

## 2022-12-19 MED ORDER — DIPHENHYDRAMINE HCL 25 MG PO CAPS
25.0000 mg | ORAL_CAPSULE | ORAL | Status: DC | PRN
  Filled 2022-12-19: qty 1

## 2022-12-19 MED ORDER — KETOROLAC TROMETHAMINE 30 MG/ML IJ SOLN: 30.0000 mg | Freq: Four times a day (QID) | INTRAMUSCULAR | Status: DC | PRN

## 2022-12-19 MED ORDER — SUCCINYLCHOLINE CHLORIDE 200 MG/10ML IV SOSY
PREFILLED_SYRINGE | INTRAVENOUS | Status: DC | PRN
  Administered 2022-12-19: 140 mg via INTRAVENOUS

## 2022-12-19 MED ORDER — ACETAMINOPHEN 500 MG PO TABS
1000.0000 mg | ORAL_TABLET | Freq: Four times a day (QID) | ORAL | Status: DC
  Administered 2022-12-20 – 2022-12-21 (×6): 1000 mg via ORAL
  Filled 2022-12-19 (×6): qty 2

## 2022-12-19 MED ORDER — OXYCODONE HCL 5 MG PO TABS: 5.0000 mg | ORAL_TABLET | Freq: Four times a day (QID) | ORAL | Status: DC | PRN

## 2022-12-19 MED ORDER — METOCLOPRAMIDE HCL 5 MG/ML IJ SOLN
INTRAMUSCULAR | Status: AC
  Filled 2022-12-19: qty 2

## 2022-12-19 MED ORDER — MIDAZOLAM HCL 2 MG/2ML IJ SOLN
INTRAMUSCULAR | Status: AC
  Filled 2022-12-19: qty 2

## 2022-12-19 MED ORDER — NIFEDIPINE 10 MG PO CAPS: 10.0000 mg | ORAL_CAPSULE | ORAL | Status: DC | PRN

## 2022-12-19 MED ORDER — KETOROLAC TROMETHAMINE 30 MG/ML IJ SOLN
INTRAMUSCULAR | Status: AC
  Filled 2022-12-19: qty 1

## 2022-12-19 MED ORDER — SODIUM CHLORIDE 0.9 % IR SOLN
Status: DC | PRN
  Administered 2022-12-19 (×2): 1

## 2022-12-19 MED ORDER — STERILE WATER FOR IRRIGATION IR SOLN
Status: DC | PRN
  Administered 2022-12-19: 1

## 2022-12-19 MED ORDER — WITCH HAZEL-GLYCERIN EX PADS: 1.0000 | MEDICATED_PAD | CUTANEOUS | Status: DC | PRN

## 2022-12-19 MED ORDER — ZOLPIDEM TARTRATE 5 MG PO TABS
5.0000 mg | ORAL_TABLET | Freq: Every evening | ORAL | Status: DC | PRN
  Administered 2022-12-20: 5 mg via ORAL
  Filled 2022-12-19: qty 1

## 2022-12-19 MED ORDER — FENTANYL CITRATE (PF) 100 MCG/2ML IJ SOLN
INTRAMUSCULAR | Status: DC | PRN
  Administered 2022-12-19: 100 ug via INTRAVENOUS

## 2022-12-19 MED ORDER — GABAPENTIN 100 MG PO CAPS
200.0000 mg | ORAL_CAPSULE | Freq: Every day | ORAL | Status: DC
  Administered 2022-12-19 – 2022-12-20 (×2): 200 mg via ORAL
  Filled 2022-12-19 (×2): qty 2

## 2022-12-19 MED ORDER — OXYCODONE HCL 5 MG PO TABS
5.0000 mg | ORAL_TABLET | Freq: Once | ORAL | Status: AC | PRN
  Administered 2022-12-19: 5 mg via ORAL

## 2022-12-19 MED ORDER — NALOXONE HCL 4 MG/10ML IJ SOLN: 1.0000 ug/kg/h | INTRAVENOUS | Status: DC | PRN

## 2022-12-19 MED ORDER — DEXAMETHASONE SODIUM PHOSPHATE 10 MG/ML IJ SOLN
INTRAMUSCULAR | Status: DC | PRN
  Administered 2022-12-19: 10 mg via INTRAVENOUS

## 2022-12-19 MED ORDER — LABETALOL HCL 5 MG/ML IV SOLN: 20.0000 mg | INTRAVENOUS | Status: DC | PRN

## 2022-12-19 MED ORDER — ENOXAPARIN SODIUM 40 MG/0.4ML IJ SOSY
40.0000 mg | PREFILLED_SYRINGE | INTRAMUSCULAR | Status: DC
  Administered 2022-12-20 – 2022-12-21 (×2): 40 mg via SUBCUTANEOUS
  Filled 2022-12-19 (×2): qty 0.4

## 2022-12-19 MED ORDER — ACETAMINOPHEN 500 MG PO TABS: 1000.0000 mg | ORAL_TABLET | Freq: Four times a day (QID) | ORAL | Status: DC

## 2022-12-19 MED ORDER — OXYTOCIN-SODIUM CHLORIDE 30-0.9 UT/500ML-% IV SOLN
INTRAVENOUS | Status: DC | PRN
  Administered 2022-12-19: 30 [IU] via INTRAVENOUS

## 2022-12-19 MED ORDER — CEFAZOLIN SODIUM-DEXTROSE 2-3 GM-%(50ML) IV SOLR
INTRAVENOUS | Status: DC | PRN
  Administered 2022-12-19: 2 g via INTRAVENOUS

## 2022-12-19 MED ORDER — METOCLOPRAMIDE HCL 5 MG/ML IJ SOLN
INTRAMUSCULAR | Status: DC | PRN
  Administered 2022-12-19: 10 mg via INTRAVENOUS

## 2022-12-19 MED ORDER — TETANUS-DIPHTH-ACELL PERTUSSIS 5-2.5-18.5 LF-MCG/0.5 IM SUSY: 0.5000 mL | PREFILLED_SYRINGE | Freq: Once | INTRAMUSCULAR | Status: DC

## 2022-12-19 MED ORDER — ACETAMINOPHEN 10 MG/ML IV SOLN
INTRAVENOUS | Status: DC | PRN
  Administered 2022-12-19: 1000 mg via INTRAVENOUS

## 2022-12-19 MED ORDER — DIBUCAINE (PERIANAL) 1 % EX OINT: 1.0000 | TOPICAL_OINTMENT | CUTANEOUS | Status: DC | PRN

## 2022-12-19 MED ORDER — MAGNESIUM SULFATE 40 GM/1000ML IV SOLN: 2.0000 g/h | INTRAVENOUS | Status: AC

## 2022-12-19 MED ORDER — LIDOCAINE HCL (CARDIAC) PF 100 MG/5ML IV SOSY
PREFILLED_SYRINGE | INTRAVENOUS | Status: DC | PRN
  Administered 2022-12-19: 60 mg via INTRAVENOUS

## 2022-12-19 MED ORDER — DIPHENHYDRAMINE HCL 50 MG/ML IJ SOLN: 12.5000 mg | INTRAMUSCULAR | Status: DC | PRN

## 2022-12-19 MED ORDER — MAGNESIUM SULFATE BOLUS VIA INFUSION
4.0000 g | Freq: Once | INTRAVENOUS | Status: AC
  Administered 2022-12-19: 4 g via INTRAVENOUS
  Filled 2022-12-19: qty 1000

## 2022-12-19 MED ORDER — OXYCODONE HCL 5 MG/5ML PO SOLN: 5.0000 mg | Freq: Once | ORAL | Status: AC | PRN

## 2022-12-19 MED ORDER — IBUPROFEN 600 MG PO TABS
600.0000 mg | ORAL_TABLET | Freq: Four times a day (QID) | ORAL | Status: DC
  Administered 2022-12-21 (×2): 600 mg via ORAL
  Filled 2022-12-19 (×2): qty 1

## 2022-12-19 MED ORDER — MEASLES, MUMPS & RUBELLA VAC IJ SOLR: 0.5000 mL | Freq: Once | INTRAMUSCULAR | Status: DC

## 2022-12-19 MED ORDER — SIMETHICONE 80 MG PO CHEW
80.0000 mg | CHEWABLE_TABLET | Freq: Three times a day (TID) | ORAL | Status: DC
  Administered 2022-12-20 – 2022-12-21 (×4): 80 mg via ORAL
  Filled 2022-12-19 (×4): qty 1

## 2022-12-19 MED ORDER — ONDANSETRON HCL 4 MG/2ML IJ SOLN
INTRAMUSCULAR | Status: AC
  Filled 2022-12-19: qty 2

## 2022-12-19 MED ORDER — PRENATAL MULTIVITAMIN CH
1.0000 | ORAL_TABLET | Freq: Every day | ORAL | Status: DC
  Administered 2022-12-20 – 2022-12-21 (×2): 1 via ORAL
  Filled 2022-12-19 (×2): qty 1

## 2022-12-19 MED ORDER — KETAMINE HCL 10 MG/ML IJ SOLN
INTRAMUSCULAR | Status: DC | PRN
  Administered 2022-12-19: 50 mg via INTRAVENOUS

## 2022-12-19 MED ORDER — PHENYLEPHRINE HCL (PRESSORS) 10 MG/ML IV SOLN
INTRAVENOUS | Status: DC | PRN
  Administered 2022-12-19: 160 ug via INTRAVENOUS

## 2022-12-19 MED ORDER — ONDANSETRON HCL 4 MG/2ML IJ SOLN
INTRAMUSCULAR | Status: DC | PRN
  Administered 2022-12-19: 4 mg via INTRAVENOUS

## 2022-12-19 MED ORDER — KETOROLAC TROMETHAMINE 30 MG/ML IJ SOLN: 30.0000 mg | Freq: Once | INTRAMUSCULAR | Status: DC | PRN

## 2022-12-19 MED ORDER — MENTHOL 3 MG MT LOZG: 1.0000 | LOZENGE | OROMUCOSAL | Status: DC | PRN

## 2022-12-19 MED ORDER — NALOXONE HCL 0.4 MG/ML IJ SOLN: 0.4000 mg | INTRAMUSCULAR | Status: DC | PRN

## 2022-12-19 MED ORDER — PROPOFOL 10 MG/ML IV BOLUS
INTRAVENOUS | Status: DC | PRN
  Administered 2022-12-19: 200 mg via INTRAVENOUS

## 2022-12-19 MED ORDER — SODIUM CHLORIDE 0.9% FLUSH: 3.0000 mL | INTRAVENOUS | Status: DC | PRN

## 2022-12-19 SURGICAL SUPPLY — 25 items
APL PRP STRL LF DISP 70% ISPRP (MISCELLANEOUS) ×2
CHLORAPREP W/TINT 26 (MISCELLANEOUS) ×4 IMPLANT
CLAMP UMBILICAL CORD (MISCELLANEOUS) ×2 IMPLANT
DRSG OPSITE POSTOP 4X10 (GAUZE/BANDAGES/DRESSINGS) ×2 IMPLANT
ELECT REM PT RETURN 9FT ADLT (ELECTROSURGICAL) ×1
ELECTRODE REM PT RTRN 9FT ADLT (ELECTROSURGICAL) ×2 IMPLANT
EXTRACTOR VACUUM M CUP 4 TUBE (SUCTIONS) IMPLANT
GLOVE BIOGEL PI IND STRL 6.5 (GLOVE) ×2 IMPLANT
GLOVE BIOGEL PI IND STRL 7.0 (GLOVE) ×2 IMPLANT
GLOVE SURG SS PI 6.5 STRL IVOR (GLOVE) ×2 IMPLANT
GOWN STRL REUS W/TWL LRG LVL3 (GOWN DISPOSABLE) ×4 IMPLANT
KIT ABG SYR 3ML LUER SLIP (SYRINGE) IMPLANT
NDL HYPO 25X5/8 SAFETYGLIDE (NEEDLE) IMPLANT
NEEDLE HYPO 25X5/8 SAFETYGLIDE (NEEDLE) IMPLANT
NS IRRIG 1000ML POUR BTL (IV SOLUTION) ×2 IMPLANT
PACK C SECTION WH (CUSTOM PROCEDURE TRAY) ×2 IMPLANT
PAD OB MATERNITY 4.3X12.25 (PERSONAL CARE ITEMS) ×2 IMPLANT
RTRCTR C-SECT PINK 25CM LRG (MISCELLANEOUS) IMPLANT
SUT MNCRL 0 VIOLET CTX 36 (SUTURE) IMPLANT
SUT PLAIN 0 NONE (SUTURE) IMPLANT
SUT VIC AB 0 CT1 36 (SUTURE) ×8 IMPLANT
SUT VIC AB 4-0 KS 27 (SUTURE) ×2 IMPLANT
TOWEL OR 17X24 6PK STRL BLUE (TOWEL DISPOSABLE) ×2 IMPLANT
TRAY FOLEY W/BAG SLVR 14FR LF (SET/KITS/TRAYS/PACK) ×2 IMPLANT
WATER STERILE IRR 1000ML POUR (IV SOLUTION) ×2 IMPLANT

## 2022-12-19 NOTE — MAU Note (Signed)
Notified Main Lab of lab add on's.

## 2022-12-19 NOTE — OB Triage Provider Note (Signed)
Shannon Gonzales is a 37 y.o. female [phone] @ Unknown gestational age, unknown LMP here with severe abdominal pain and vaginal bleeding. She arrived via EMS. BP on EMS truck reported as 170's systolic and again on repeat BP reading.  The patient has received no prenatal care.  She has a poor OB history including a 19 week SAB, and a 23w& 26w delivery. 1 previous C/s.  She does have a history of chronic hypertension, on labetalol BID. She did not take her BP medication today.   Upon arrival bedside US attempted; MFM [place] sonographer called to bedside. Large abruption verbalized along with FHR in the 90's. Dr. Jolayne Panther called and notified.  Dr. Alvester Morin at bedside along with Shannon Gonzales CNM  Cervix attempted to be checked: unable to check d/t patient's discomfort Moderate amount of bright red blood noted on exam glove. Exam by Venia Carbon, NP.  BP 153/97   Stat C/s called per Dr. Jolayne Panther.  Shannon Lope, NP 12/19/2022 6:36 PM

## 2022-12-19 NOTE — Discharge Summary (Signed)
Postpartum Discharge Summary  Date of Service updated-6/13     Patient Name: Shannon Gonzales DOB: 25-Aug-1985 MRN: [id number]  Date of admission: 12/19/2022 Delivery date:12/19/2022  Delivering provider: CONSTANT, PEGGY  Date of discharge: 12/21/2022  Admitting diagnosis: Status post emergency cesarean section [Z98.891] Intrauterine pregnancy: Unknown     Secondary diagnosis:  Principal Problem:   Status post emergency cesarean section Active Problems:   Marijuana use   Placental abruption   History of cocaine use  Additional problems: Chronic HTN    Discharge diagnosis: Preterm Pregnancy Delivered, CHTN, and Drug use hx, placental abruption                                               Post partum procedures: none Augmentation: N/A Complications: Placental Abruption   Hospital course: Onset of Labor With Unplanned C/S   37 y.o. yo [id number] at [redacted]w[redacted]d was admitted in abd pain and contractions on 12/19/2022. Noted NRFHT and placental abruption on [place]. The patient went for emergency cesarean section due to  NRFHT in the setting of placental abruption . Delivery details as follows: Membrane Rupture Time/Date: 6:35 PM ,12/19/2022   Delivery Method:C-Section, Low Transverse  Details of operation can be found in separate operative note. Due to chronic HTN- medications were adjusted.  She is ambulating,tolerating a regular diet, passing flatus, and urinating well.  Baby passed on HD #2.  Chronic HTN with elevated noted postpartum- pt asymptomatic.  Patient requesting early discharge on POD #2 due to social concerns.  BP had noted to improve with medication and will plan for close outpatient follow up.  Newborn Data: Birth date:12/19/2022  Birth time:6:35 PM  Gender:Female  Living status:Living  Apgars:0 ,1  Weight:650 g   Magnesium Sulfate received: No BMZ received: No Rhophylac:N/A MMR:No  T-DaP: offered postpartum Flu: N/A Transfusion:No  Physical exam  Vitals:   12/21/22 0754  12/21/22 0755 12/21/22 0826 12/21/22 1131  BP:  (!) 150/99 (!) 151/102 (!) 136/93  Pulse:  87 91 75  Resp:  16  16  Temp:  98.2 F (36.8 C)  97.9 F (36.6 C)  TempSrc:    Oral  SpO2: 99% 99%  100%  Weight:      Height:       General: alert, cooperative, and no distress CV: RRR Lungs: CTAB Abd: soft, +BS Lochia: appropriate Uterine Fundus: firm, non-tender Incision: Dressing is clean, dry, and intact DVT Evaluation: No evidence of DVT seen on physical exam. Labs: Lab Results  Component Value Date   WBC 21.6 (H) 12/20/2022   HGB 7.2 (L) 12/20/2022   HCT 21.6 (L) 12/20/2022   MCV 92.7 12/20/2022   PLT 182 12/20/2022      Latest Ref Rng & Units 12/19/2022    6:22 PM  CMP  Glucose 70 - 99 mg/dL 478   BUN 6 - 20 mg/dL <5   Creatinine 2.95 - 1.00 mg/dL 6.21   Sodium 308 - 657 mmol/L 135   Potassium 3.5 - 5.1 mmol/L 3.1   Chloride 98 - 111 mmol/L 106   CO2 22 - 32 mmol/L 21   Calcium 8.9 - 10.3 mg/dL 8.6   Total Protein 6.5 - 8.1 g/dL 5.7   Total Bilirubin 0.3 - 1.2 mg/dL 0.2   Alkaline Phos 38 - 126 U/L 62   AST 15 - 41 U/L 22  ALT 0 - 44 U/L 8    Edinburgh Score:    06/05/2018    8:00 PM  Edinburgh Postnatal Depression Scale Screening Tool  I have been able to laugh and see the funny side of things. 0  I have looked forward with enjoyment to things. 1  I have blamed myself unnecessarily when things went wrong. 1  I have been anxious or worried for no good reason. 3  I have felt scared or panicky for no good reason. 2  Things have been getting on top of me. 2  I have been so unhappy that I have had difficulty sleeping. 1  I have felt sad or miserable. 1  I have been so unhappy that I have been crying. 1  The thought of harming myself has occurred to me. 0  Edinburgh Postnatal Depression Scale Total 12     After visit meds:  Allergies as of 12/21/2022   No Known Allergies      Medication List     STOP taking these medications    enalapril 10 MG  tablet Commonly known as: VASOTEC   NIFEdipine 60 MG 24 hr tablet Commonly known as: ADALAT CC   pantoprazole 20 MG tablet Commonly known as: PROTONIX   zolpidem 5 MG tablet Commonly known as: AMBIEN       TAKE these medications    acetaminophen 325 MG tablet Commonly known as: Tylenol Take 2 tablets (650 mg total) by mouth every 6 (six) hours as needed. What changed:  medication strength how much to take   amLODipine 10 MG tablet Commonly known as: NORVASC Take 1 tablet (10 mg total) by mouth daily.   docusate sodium 100 MG capsule Commonly known as: Colace Take 1 capsule (100 mg total) by mouth 2 (two) times daily for 20 days, then as needed.   gabapentin 300 MG capsule Commonly known as: Neurontin Take 1 capsule (300 mg total) by mouth 3 (three) times daily for 3 days.   hydrOXYzine 10 MG tablet Commonly known as: ATARAX Take 1 tablet (10 mg total) by mouth every 8 (eight) hours as needed for up to 16 doses for anxiety.   ibuprofen 600 MG tablet Commonly known as: ADVIL Take 1 tablet (600 mg total) by mouth every 6 (six) hours.   labetalol 300 MG tablet Commonly known as: NORMODYNE Take 1 tablet (300 mg total) by mouth 2 (two) times daily.   oxyCODONE 5 MG immediate release tablet Commonly known as: Oxy IR/ROXICODONE Take 1 tablet (5 mg total) by mouth every 6 (six) hours as needed for up to 7 days for severe pain.         Discharge home in stable condition Infant Feeding:  NA Infant Disposition:morgue Discharge instruction: per After Visit Summary and Postpartum booklet. Activity: Advance as tolerated. Pelvic rest for 6 weeks.  Diet: routine diet Future Appointments: Future Appointments  Date Time Provider Department Center  12/28/2022  9:20 AM [facility] NURSE [facility]   Follow up Visit:  Follow-up Information     Center for [facility] Follow up.   Specialty: Obstetrics and Gynecology Why: Please  follow up in 1wk. Contact information: [address] [place] [address] [phone]               Message sent to [facility] 6/11  Please schedule this patient for a In person postpartum visit in 6 weeks with the following provider: MD. Additional Postpartum F/U:Incision check  1 week and BP check 1 week  High risk pregnancy complicated by: HTN and Drug use, no prenatal care Delivery mode:  C-Section, Low Transverse  Anticipated Birth Control:  Depot   12/21/2022 Sharon Seller, DO

## 2022-12-19 NOTE — Anesthesia Preprocedure Evaluation (Signed)
Anesthesia Evaluation  Patient identified by MRN, date of birth, ID band Patient awake    Reviewed: Allergy & PrecautionsPreop documentation limited or incomplete due to emergent nature of procedure.  Airway Mallampati: III  TM Distance: >3 FB Neck ROM: Full    Dental no notable dental hx.    Pulmonary neg pulmonary ROS, former smoker   Pulmonary exam normal breath sounds clear to auscultation       Cardiovascular hypertension, Pt. on medications Normal cardiovascular exam Rhythm:Regular Rate:Normal     Neuro/Psych  PSYCHIATRIC DISORDERS Anxiety Depression    negative neurological ROS     GI/Hepatic negative GI ROS, Neg liver ROS,,,  Endo/Other  negative endocrine ROS    Renal/GU negative Renal ROS  negative genitourinary   Musculoskeletal negative musculoskeletal ROS (+)    Abdominal   Peds negative pediatric ROS (+)  Hematology negative hematology ROS (+)   Anesthesia Other Findings   Reproductive/Obstetrics (+) Pregnancy 23 week abruption BIB ambulance to [facility]- code caesarean                              Anesthesia Physical Anesthesia Plan  ASA: 3 and emergent  Anesthesia Plan: General   Post-op Pain Management: Ofirmev IV (intra-op)*, Ketamine IV* and Dilaudid IV   Induction: Intravenous, Rapid sequence and Cricoid pressure planned  PONV Risk Score and Plan: Ondansetron, Dexamethasone, Treatment may vary due to age or medical condition and Scopolamine patch - Pre-op  Airway Management Planned: Oral ETT and Video Laryngoscope Planned  Additional Equipment: None  Intra-op Plan:   Post-operative Plan: Extubation in OR  Informed Consent: I have reviewed the patients History and Physical, chart, labs and discussed the procedure including the risks, benefits and alternatives for the proposed anesthesia with the patient or authorized representative who has indicated his/her  understanding and acceptance.     Dental advisory given and Only emergency history available  Plan Discussed with: CRNA  Anesthesia Plan Comments: (Code caesarean )       Anesthesia Quick Evaluation

## 2022-12-19 NOTE — Op Note (Addendum)
Shannon Gonzales PROCEDURE DATE: 12/19/2022  PREOPERATIVE DIAGNOSIS: Intrauterine pregnancy at  [redacted] weeks gestation; abruptio placenta  POSTOPERATIVE DIAGNOSIS: The same  PROCEDURE:     Cesarean Section  SURGEON:  Dr. Catalina Antigua  ASSISTANT: Dr. Lanae Crumbly  An experienced assistant was required given the standard of surgical care given the complexity of the case.  This assistant was needed for exposure, dissection, suctioning, retraction, instrument exchange, assisting with delivery with administration of fundal pressure, and for overall help during the procedure.  INDICATIONS: Shannon Gonzales is a 37 y.o. [id number] at 23 scheduled for cesarean section secondary to abruptio placenta.  The risks of cesarean section discussed with the patient included but were not limited to: bleeding which may require transfusion or reoperation; infection which may require antibiotics; injury to bowel, bladder, ureters or other surrounding organs; injury to the fetus; need for additional procedures including hysterectomy in the event of a life-threatening hemorrhage; placental abnormalities wth subsequent pregnancies, incisional problems, thromboembolic phenomenon and other postoperative/anesthesia complications. The patient concurred with the proposed plan, giving informed written consent for the procedure.    FINDINGS:  Viable female infant in cephalic presentation.  Apgars 0, 1 and 4.  Clear amniotic fluid.  Intact placenta, three vessel cord which delivered spontaneously with a large retroplacental clot.  Normal uterus, fallopian tubes bilaterally, normal left ovary. Right ovary not visualized.  ANESTHESIA:    General INTRAVENOUS FLUIDS:1500 ml ESTIMATED BLOOD LOSS: 947 ml URINE OUTPUT:  100 ml SPECIMENS: Placenta sent to pathology COMPLICATIONS: None immediate  PROCEDURE IN DETAIL:  The patient received intravenous antibiotics and had sequential compression devices applied to her lower extremities while  in the preoperative area.  She was then taken to the operating room where anesthesia was induced and was found to be adequate. A foley catheter was placed into her bladder and attached to Shannon Gonzales gravity. She was then placed in a dorsal supine position with a leftward tilt, and prepped and draped in a sterile manner. After an adequate timeout was performed, a Pfannenstiel skin incision was made with scalpel and carried through to the underlying layer of fascia. The fascia was incised in the midline and this incision was extended bilaterally bluntly. The underlying rectus muscles were dissected off bluntly. The rectus muscles were separated in the midline bluntly and the peritoneum was entered bluntly. The bladder blade was inserted into the abdominal cavity. Attention was turned to the lower uterine segment where a bladder flap was created, and a transverse hysterotomy was made with a scalpel and extended bilaterally bluntly. The infant was successfully delivered, and cord was clamped and cut and infant was handed over to awaiting neonatology team. Placenta delivered intact with three-vessel cord spontaneously during cord gas collection. The uterus was cleared of clot and debris.  The hysterotomy was closed with 0 Vicryl in a running locked fashion, and an imbricating layer was also placed with a 0 Vicryl. Overall, excellent hemostasis was noted. The pelvis was cleared of all clot and debris. Hemostasis was confirmed on all surfaces.  The peritoneum and the muscles were reapproximated using 0 vicryl interrupted stitches. The fascia was then closed using 0 Vicryl in a running fashion.  The skin was closed in a subcuticular fashion using 3.0 Vicryl. The patient tolerated the procedure well. Sponge, lap, instrument and needle counts were correct x 2. She was taken to the recovery room in stable condition.    Shannon Gonzales ConstantMD  12/19/2022 7:00 PM

## 2022-12-19 NOTE — H&P (Signed)
OBSTETRIC ADMISSION HISTORY AND PHYSICAL  AAMIYAH VEHRS is a 37 y.o. female [id number] with IUP at [redacted]w[redacted]d by [place] today presenting for contraction pain. She reports +FMs, No LOF, no VB, no blurry vision, headaches or peripheral edema, and RUQ pain.  She plans on breast/bottle feeding. She is unsure for birth control. She received no prenatal care  Dating: By ultrasound today --->  Estimated Date of Delivery: 03/25/23  Sono:    @26  w 2 d, CWD, incomplete anatomy, cephalic presentation, placenta appeared bulky and heterogenous  Prenatal History/Complications:   Problem  History of Cocaine Use  Placental Abruption  Status Post Emergency Cesarean Section   With first delivery, 3 VBACs   Marijuana Use   Was using MJ daily for N/V, now sporadic use as needed Declined UDS 03/28/18     Past Medical History: Past Medical History:  Diagnosis Date   Anxiety    Depression    Drug abuse, marijuana    with pregnancy   Hx of PTL (preterm labor), current pregnancy    Hypertension    Late prenatal care    Trichimoniasis    with pregnancy    Past Surgical History: Past Surgical History:  Procedure Laterality Date   CESAREAN SECTION      Obstetrical History: OB History     Gravida  11   Para  7   Term  1   Preterm  6   AB  3   Living  6      SAB  2   IAB  1   Ectopic      Multiple  0   Live Births  3           Social History Social History   Socioeconomic History   Marital status: Single    Spouse name: Not on file   Number of children: Not on file   Years of education: Not on file   Highest education level: Not on file  Occupational History   Not on file  Tobacco Use   Smoking status: Former    Packs/day: .1    Types: Cigarettes   Smokeless tobacco: Never  Vaping Use   Vaping Use: Never used  Substance and Sexual Activity   Alcohol use: No   Drug use: Not Currently    Types: Hashish, Marijuana   Sexual activity: Yes    Birth  control/protection: None  Other Topics Concern   Not on file  Social History Narrative   Not on file   Social Determinants of Health   Financial Resource Strain: Not on file  Food Insecurity: Not on file  Transportation Needs: Not on file  Physical Activity: Not on file  Stress: Not on file  Social Connections: Not on file    Family History: Family History  Problem Relation Age of Onset   Hypertension Mother     Allergies: No Known Allergies  Medications Prior to Admission  Medication Sig Dispense Refill Last Dose   acetaminophen (TYLENOL) 500 MG tablet Take 1,000 mg by mouth every 6 (six) hours as needed.      enalapril (VASOTEC) 10 MG tablet Take 1 tablet (10 mg total) by mouth daily. 30 tablet 3    hydrOXYzine (ATARAX/VISTARIL) 10 MG tablet Take 1 tablet (10 mg total) by mouth every 8 (eight) hours as needed for up to 16 doses for anxiety. 16 tablet 0    NIFEdipine (ADALAT CC) 60 MG 24 hr tablet Take 1 tablet (60 mg  total) by mouth 2 (two) times daily. (Patient not taking: Reported on 09/29/2020) 60 tablet 3    pantoprazole (PROTONIX) 20 MG tablet Take 2 tablets (40 mg total) by mouth daily. (Patient not taking: Reported on 09/29/2020) 30 tablet 0    zolpidem (AMBIEN) 5 MG tablet Take 1 tablet (5 mg total) by mouth at bedtime as needed for sleep. (Patient not taking: Reported on 09/29/2020) 15 tablet 1      Review of Systems   All systems reviewed and negative except as stated in HPI  Blood pressure (!) 153/97, pulse 65, last menstrual period 06/18/2022, SpO2 99 %, unknown if currently breastfeeding. General appearance: alert, cooperative, appears stated age, and moderate distress Lungs: clear to auscultation bilaterally Heart: regular rate and rhythm Abdomen: soft, tender; bowel sounds normal Pelvic: No lesions Extremities: Homans sign is negative, no sign of DVT Presentation: cephalic Fetal monitoring Baseline: 90s bpm Uterine activity unclear  Prenatal  labs:  ABO, Rh: --/--/B POS (06/11 1818) Antibody: NEG (06/11 1818) Rubella:  Pending RPR:   Pending HBsAg:   Pending HIV:   Pending GBS:   Not done  Prenatal Transfer Tool  Maternal Diabetes: No Genetic Screening: Not done Maternal Ultrasounds/Referrals: Other: Placental abruption noted today Fetal Ultrasounds or other Referrals:  None Maternal Substance Abuse:  No Significant Maternal Medications:  None Significant Maternal Lab Results:  Other:  Number of Prenatal Visits:Less than or equal to 3 verified prenatal visits Other Comments:   Patient has a history of cocaine and marijuana use, had no prenatal care in this pregnancy .  She had a history of 6 preterm deliveries, the earliest at 23 weeks and 2 days.  A history of 1 C-section with 6 VBACs.  History of chronic hypertension with superimposed preeclampsia.  Results for orders placed or performed during the hospital encounter of 12/19/22 (from the past 24 hour(s))  Type and screen [facility]   Collection Time: 12/19/22  6:18 PM  Result Value Ref Range   ABO/RH(D) B POS    Antibody Screen NEG    Sample Expiration      12/22/2022,2359 Performed at [facility], [address], [place], [place] [address]   CBC with Differential/Platelet   Collection Time: 12/19/22  6:22 PM  Result Value Ref Range   WBC 15.1 (H) 4.0 - 10.5 K/uL   RBC 3.09 (L) 3.87 - 5.11 MIL/uL   Hemoglobin 9.3 (L) 12.0 - 15.0 g/dL   HCT 60.4 (L) 54.0 - 98.1 %   MCV 91.3 80.0 - 100.0 fL   MCH 30.1 26.0 - 34.0 pg   MCHC 33.0 30.0 - 36.0 g/dL   RDW 19.1 47.8 - 29.5 %   Platelets 211 150 - 400 K/uL   nRBC 0.0 0.0 - 0.2 %   Neutrophils Relative % 78 %   Neutro Abs 11.6 (H) 1.7 - 7.7 K/uL   Lymphocytes Relative 14 %   Lymphs Abs 2.1 0.7 - 4.0 K/uL   Monocytes Relative 5 %   Monocytes Absolute 0.8 0.1 - 1.0 K/uL   Eosinophils Relative 3 %   Eosinophils Absolute 0.5 0.0 - 0.5 K/uL   Basophils Relative 0 %   Basophils Absolute 0.0  0.0 - 0.1 K/uL   Immature Granulocytes 0 %   Abs Immature Granulocytes 0.06 0.00 - 0.07 K/uL  Comprehensive metabolic panel   Collection Time: 12/19/22  6:22 PM  Result Value Ref Range   Sodium 135 135 - 145 mmol/L  Potassium 3.1 (L) 3.5 - 5.1 mmol/L   Chloride 106 98 - 111 mmol/L   CO2 21 (L) 22 - 32 mmol/L   Glucose, Bld 115 (H) 70 - 99 mg/dL   BUN <5 (L) 6 - 20 mg/dL   Creatinine, Ser 1.61 0.44 - 1.00 mg/dL   Calcium 8.6 (L) 8.9 - 10.3 mg/dL   Total Protein 5.7 (L) 6.5 - 8.1 g/dL   Albumin 2.5 (L) 3.5 - 5.0 g/dL   AST 22 15 - 41 U/L   ALT 8 0 - 44 U/L   Alkaline Phosphatase 62 38 - 126 U/L   Total Bilirubin 0.2 (L) 0.3 - 1.2 mg/dL   GFR, Estimated >09 >60 mL/min   Anion gap 8 5 - 15  DIC Panel ONCE - STAT   Collection Time: 12/19/22  6:40 PM  Result Value Ref Range   Prothrombin Time 14.0 11.4 - 15.2 seconds   INR 1.1 0.8 - 1.2   aPTT 29 24 - 36 seconds   Fibrinogen 311 210 - 475 mg/dL   D-Dimer, Quant PENDING 0.00 - 0.50 ug/mL-FEU   Platelets 200 150 - 400 K/uL   Smear Review NO SCHISTOCYTES SEEN     Patient Active Problem List   Diagnosis Date Noted   History of cocaine use 12/19/2022   Supervision of high risk pregnancy, antepartum 12/08/2022   Vaginal delivery 05/14/2020   Supervision of high-risk pregnancy 05/13/2020   Elevated liver enzymes 07/22/2019   Fetal demise > 22 weeks, delivered, current hospitalization 07/20/2019   Placental abruption 07/20/2019   Chronic hypertension with superimposed preeclampsia 06/07/2018   Preterm labor in third trimester 06/05/2018   Hypertension in pregnancy, essential, antepartum, third trimester 03/28/2018   History of preterm delivery, currently pregnant in second trimester 03/28/2018   Supervision of high risk pregnancy, antepartum, second trimester 03/28/2018   No prenatal care in current pregnancy 03/28/2018   Status post emergency cesarean section 03/28/2018   Anxiety 03/28/2018   Smoker 03/28/2018   Marijuana use  03/28/2018    Assessment/Plan:  SEASON ASTACIO is a 37 y.o. [id number] at [redacted]w[redacted]d here for abdominal pain and nonreassuring fetal heart rate secondary to placental abruption  #Labor: Stat cesarean section.  Patient had been n.p.o. for 2 hours.  Consented emergently. [name] has agreed to proceed with cesarean section due to Non-reassuring FHR and placental abruption . The risks of surgery were discussed with the patient including but were not limited to: bleeding which may require transfusion or reoperation; infection which may require antibiotics; injury to bowel, bladder, ureters or other surrounding organs; injury to the fetus; need for additional procedures including hysterectomy in the event of a life-threatening hemorrhage; formation of adhesions; placental abnormalities wth subsequent pregnancies; incisional problems; thromboembolic phenomenon and other postoperative/anesthesia complications. The patient concurred with the proposed plan, giving informed written consent for the procedure. Patient has been NPO for 2 hours hours she will remain NPO for procedure. Anesthesia and OR aware. Preoperative prophylactic antibiotics and SCDs ordered on call to the OR.  #Pain: General anesthesia #FWB: Category 3 #ID:  Prenatal labs collected  #History of chronic hypertension with preeclampsia: Elevated blood pressures in MAU, will determine if magnesium is needed postpartum if elevated blood pressures continue.  Preeclampsia labs pending. #History of drug use: Chart review with positive cocaine and THC.  UDS collected  Myrtie Hawk, DO  12/19/2022, 7:43 PM

## 2022-12-19 NOTE — Anesthesia Procedure Notes (Signed)
Procedure Name: Intubation Date/Time: 12/19/2022 6:33 PM  Performed by: [name], DOPre-anesthesia Checklist: Patient identified, Suction available and Emergency Drugs available Patient Re-evaluated:Patient Re-evaluated prior to induction Oxygen Delivery Method: Circle system utilized Preoxygenation: Pre-oxygenation with 100% oxygen Induction Type: IV induction, Rapid sequence and Cricoid Pressure applied Laryngoscope Size: Glidescope and 3 Tube type: Oral Tube size: 7.0 mm Number of attempts: 1 Airway Equipment and Method: Video-laryngoscopy and Rigid stylet Placement Confirmation: ETT inserted through vocal cords under direct vision, positive ETCO2 and breath sounds checked- equal and bilateral Secured at: 22 cm Tube secured with: Tape Dental Injury: Teeth and Oropharynx as per pre-operative assessment

## 2022-12-19 NOTE — MAU Note (Signed)
.  NYEEMA WANT is a 37 y.o. at Unknown here in MAU reporting: Patient arrived to MAU via EMS with complaints of CTX. No PNC. Reports she is 23 weeks. Unsure of LMP. EMS reported patient's BP on truck had a systolic in the 170's. Awilda Bill, NP, and Camelia Eng, CNM, at bedside upon patient's arrival to the unit. Patient reports CHTN and takes Labetalol BID but has not taken her medication today. Patient reports NPO is 2 hours ago. LR fluid bolus initiated upon arrival. NP performed cervical exam. Difficult exam due to discomfort from patient. BP 153/97. Patient denies allergies. EMS started 20 gauge IV in left [facility] on truck prior to patient's arrival.  -Ultrasonographer requested for STAT bedside US by Awilda Bill, NP. Ultrasonographer notified by Flippin, RN.  -This RN performed blood draw with butterfly needle and collected blood tubes ASAP including a red, blue, type and screen, green, two golds, and a purple. -20 g IV started in upper right anterior forearm because patient reported discomfort with fluid bolus in left AC. -Dr. Alvester Morin at bedside at [facility]. Dr. Alvester Morin, MD, notified Constant, MD, and Constant, MD stated to call a Code Cesarean. Dr. Alvester Morin ordered STAT C/S.  This RN called Urgent Broadcast Code Cesarean. Patient taken to OR immediately accompanied by this RN, Rasch, NP, and Alvester Morin, MD.   Vitals:   12/19/22 1824 12/19/22 1826  BP: (!) 153/97   Pulse: 65   SpO2:  99%

## 2022-12-19 NOTE — MAU Note (Signed)
OR Nurse called to request if we had sent the labs. This RN confirmed labs were sent. This RN called Main Lab and spoke to Verline Lema again who stated she had the labs in her hand now.

## 2022-12-19 NOTE — Anesthesia Postprocedure Evaluation (Signed)
Anesthesia Post Note  Patient: Shannon Gonzales  Procedure(s) Performed: CESAREAN SECTION     Patient location during evaluation: PACU Anesthesia Type: General Level of consciousness: awake and alert, oriented and patient cooperative Pain management: pain level controlled Vital Signs Assessment: post-procedure vital signs reviewed and stable Respiratory status: spontaneous breathing, nonlabored ventilation and respiratory function stable Cardiovascular status: blood pressure returned to baseline and stable Postop Assessment: no apparent nausea or vomiting Anesthetic complications: no   No notable events documented.  Last Vitals:  Vitals:   12/19/22 1945 12/19/22 2000  BP: (!) 152/105 (!) 154/105  Pulse:  97  Resp: 19 20  Temp:    SpO2: 98% 92%    Last Pain:  Vitals:   12/19/22 1945  TempSrc:   PainSc: 8    Pain Goal:                   [name]

## 2022-12-19 NOTE — Transfer of Care (Signed)
Immediate Anesthesia Transfer of Care Note  Patient: Shannon Gonzales  Procedure(s) Performed: CESAREAN SECTION  Patient Location: PACU  Anesthesia Type:General  Level of Consciousness: awake, alert , and patient cooperative  Airway & Oxygen Therapy: Patient Spontanous Breathing and Patient connected to face mask oxygen  Post-op Assessment: Report given to RN and Post -op Vital signs reviewed and stable  Post vital signs: Reviewed and stable  Last Vitals:  Vitals Value Taken Time  BP 152/105 12/19/22 1945  Temp    Pulse 86 12/19/22 1938  Resp 19 12/19/22 1947  SpO2 89 % 12/19/22 1938  Vitals shown include unvalidated device data.  Last Pain: There were no vitals filed for this visit.       Complications: No notable events documented.

## 2022-12-20 ENCOUNTER — Encounter (HOSPITAL_COMMUNITY): Payer: Self-pay | Admitting: Obstetrics and Gynecology

## 2022-12-20 LAB — CBC WITH DIFFERENTIAL/PLATELET
Abs Immature Granulocytes: 0.1 10*3/uL — ABNORMAL HIGH (ref 0.00–0.07)
Basophils Absolute: 0 10*3/uL (ref 0.0–0.1)
Basophils Relative: 0 %
Eosinophils Absolute: 0 10*3/uL (ref 0.0–0.5)
Eosinophils Relative: 0 %
HCT: 22.6 % — ABNORMAL LOW (ref 36.0–46.0)
Hemoglobin: 7.5 g/dL — ABNORMAL LOW (ref 12.0–15.0)
Immature Granulocytes: 0 %
Lymphocytes Relative: 5 %
Lymphs Abs: 1.1 10*3/uL (ref 0.7–4.0)
MCH: 30.1 pg (ref 26.0–34.0)
MCHC: 33.2 g/dL (ref 30.0–36.0)
MCV: 90.8 fL (ref 80.0–100.0)
Monocytes Absolute: 0.5 10*3/uL (ref 0.1–1.0)
Monocytes Relative: 2 %
Neutro Abs: 20.6 10*3/uL — ABNORMAL HIGH (ref 1.7–7.7)
Neutrophils Relative %: 93 %
Platelets: 214 10*3/uL (ref 150–400)
RBC: 2.49 MIL/uL — ABNORMAL LOW (ref 3.87–5.11)
RDW: 14.2 % (ref 11.5–15.5)
WBC: 22.3 10*3/uL — ABNORMAL HIGH (ref 4.0–10.5)
nRBC: 0 % (ref 0.0–0.2)

## 2022-12-20 LAB — DIC (DISSEMINATED INTRAVASCULAR COAGULATION)PANEL
D-Dimer, Quant: >20 ug/mL-FEU — ABNORMAL HIGH (ref 0.00–0.50)
Fibrinogen: 254 mg/dL (ref 210–475)
INR: 1.1 (ref 0.8–1.2)
Platelets: 202 10*3/uL (ref 150–400)
Prothrombin Time: 14.5 seconds (ref 11.4–15.2)
Smear Review: NONE SEEN
aPTT: 28 seconds (ref 24–36)

## 2022-12-20 LAB — CBC
HCT: 21.6 % — ABNORMAL LOW (ref 36.0–46.0)
Hemoglobin: 7.2 g/dL — ABNORMAL LOW (ref 12.0–15.0)
MCH: 30.9 pg (ref 26.0–34.0)
MCHC: 33.3 g/dL (ref 30.0–36.0)
MCV: 92.7 fL (ref 80.0–100.0)
Platelets: 182 10*3/uL (ref 150–400)
RBC: 2.33 MIL/uL — ABNORMAL LOW (ref 3.87–5.11)
RDW: 14.5 % (ref 11.5–15.5)
WBC: 21.6 10*3/uL — ABNORMAL HIGH (ref 4.0–10.5)
nRBC: 0 % (ref 0.0–0.2)

## 2022-12-20 LAB — RPR: RPR Ser Ql: NONREACTIVE

## 2022-12-20 MED ORDER — LABETALOL HCL 200 MG PO TABS
200.0000 mg | ORAL_TABLET | Freq: Two times a day (BID) | ORAL | Status: DC
  Administered 2022-12-20 (×2): 200 mg via ORAL
  Filled 2022-12-20 (×2): qty 1

## 2022-12-20 MED ORDER — PANTOPRAZOLE SODIUM 40 MG PO TBEC
40.0000 mg | DELAYED_RELEASE_TABLET | Freq: Every day | ORAL | Status: DC
  Administered 2022-12-20 – 2022-12-21 (×2): 40 mg via ORAL
  Filled 2022-12-20 (×2): qty 1

## 2022-12-20 MED ORDER — AMLODIPINE BESYLATE 5 MG PO TABS
10.0000 mg | ORAL_TABLET | Freq: Every day | ORAL | Status: DC
  Administered 2022-12-20 – 2022-12-21 (×2): 10 mg via ORAL
  Filled 2022-12-20 (×2): qty 2

## 2022-12-20 NOTE — Progress Notes (Signed)
CSW acknowledged consult and completed chart review. CSW will meet with MOB once magnesium is discontinued to complete psychosocial assessment.   Leeza Heiner, LCSW Clinical Social Worker [facility] Cell#: [phone]  

## 2022-12-20 NOTE — Progress Notes (Signed)
Subjective: Postpartum Day 1: Cesarean Delivery Patient reports feeling well. She denies HA, visual changes, RUQ/epigastric pain, nausea or emesis.  Patient is tolerating a regular diet.   Objective: Vital signs in last 24 hours: Temp:  [96.7 F (35.9 C)-97.5 F (36.4 C)] 97.5 F (36.4 C) (06/11 2300) Pulse Rate:  [64-97] 78 (06/12 0536) Resp:  [12-26] 14 (06/12 0700) BP: (109-162)/(82-108) 135/90 (06/12 0536) SpO2:  [88 %-100 %] 100 % (06/11 2149) Weight:  [71.2 kg] 71.2 kg (06/12 0415)  Physical Exam:  General: alert, cooperative, and no distress Lochia: appropriate Uterine Fundus: firm Incision: Pressure dressing removed, honey comb dressing intact DVT Evaluation: No evidence of DVT seen on physical exam.  Recent Labs    12/19/22 2357 12/20/22 0348  HGB 7.5* 7.2*  HCT 22.6* 21.6*    Assessment/Plan: Status post Cesarean section on magnesium sulfate for severe preeclampsia  - Continue magnesium sulfate for seizure prophylaxis - Continue close monitoring of BP - Will start lasix and resume home meds of labetalol 200 BID (patient was on prior to pregnancy) and norvasc - Continue routine postpartum care.  Shannon Antigua, MD 12/20/2022, 9:33 AM

## 2022-12-20 NOTE — Lactation Note (Signed)
This note was copied from a baby's chart.  NICU Lactation Consultation Note  Patient Name: Shannon Gonzales [id number] Date: 12/20/2022 Age:36 hours  Reason for consult: Initial assessment; NICU baby; Preterm <34wks; Infant < 6lbs (Maternal USD (+) on admission for benzodiazepines, opiates and THC, LPNC, AMA)  SUBJECTIVE Visited with family of 36 hours old pre-term NICU female; Ms. Deroo is a P6 and has some experience breastfeeding. She reported that she would like starting pumping, baby status is critical, oral feedings has not been discussed yet but per Dr. Alice Rieger, we won't be able to use maternal breastmilk for now due to Hx of polysubstance use. This LC set up a DEBP and initiated pumping at 23 hours post-partum. Reviewed pumping schedule, lactogenesis II and anticipatory guidelines.   OBJECTIVE Infant data: Mother's Current Feeding Choice: -- (NPO)  Infant feeding assessment No data recorded  Maternal data: [id number]  C-Section, Low Transverse Has patient been taught Hand Expression?: Yes Hand Expression Comments: colostrum noted Significant Breast History:: (+) breast changes during the pregnancy Current breast feeding challenges:: NICU admission Does the patient have breastfeeding experience prior to this delivery?: Yes How long did the patient breastfeed?: 2 weeks to 4 months Pumping frequency: initiated pumping at 23 hours post-partum due to baby's status Pumped volume: 0 mL Flange Size: 21 Risk factor for low milk supply:: prematurity, infant separation, late onset of pumping, PPH of 1460 cc  WIC Program: Yes WIC Referral Sent?: Yes What county?: [place]  ASSESSMENT Infant: Feeding Status: NPO  Maternal: Milk volume: Normal  INTERVENTIONS/PLAN Interventions: Interventions: Breast feeding basics reviewed; DEBP; Education; [employer] brochure; Hand pump Tools: Pump; Flanges Pump Education: Setup, frequency, and cleaning; Milk Storage  Plan: Encouraged pumping  every 3 hours, ideally 8 pumping sessions/24 hours Breast massage and hand expression were also encouraged prior pumping  No other support person at this time. All questions and concerns answered, family to contact [facility] services PRN.  Consult Status: NICU follow-up NICU Follow-up type: New admission follow up   Ondra Deboard Venetia Constable 12/20/2022, 6:40 PM

## 2022-12-21 ENCOUNTER — Other Ambulatory Visit (HOSPITAL_COMMUNITY): Payer: Self-pay

## 2022-12-21 DIAGNOSIS — I959 Hypotension, unspecified: Secondary | ICD-10-CM | POA: Diagnosis not present

## 2022-12-21 LAB — SURGICAL PATHOLOGY

## 2022-12-21 LAB — RUBELLA SCREEN: Rubella: 5.44 index (ref >0.99)

## 2022-12-21 MED ORDER — ACETAMINOPHEN 325 MG PO TABS
650.0000 mg | ORAL_TABLET | Freq: Four times a day (QID) | ORAL | 0 refills | Status: AC | PRN
  Filled 2022-12-21: qty 100, 13d supply, fill #0

## 2022-12-21 MED ORDER — AMLODIPINE BESYLATE 10 MG PO TABS
10.0000 mg | ORAL_TABLET | Freq: Every day | ORAL | 0 refills | Status: DC
  Filled 2022-12-21: qty 30, 30d supply, fill #0

## 2022-12-21 MED ORDER — IBUPROFEN 600 MG PO TABS
600.0000 mg | ORAL_TABLET | Freq: Four times a day (QID) | ORAL | 0 refills | Status: AC
  Filled 2022-12-21: qty 30, 8d supply, fill #0

## 2022-12-21 MED ORDER — MEDROXYPROGESTERONE ACETATE 150 MG/ML IM SUSP
150.0000 mg | Freq: Once | INTRAMUSCULAR | Status: AC
  Administered 2022-12-21: 150 mg via INTRAMUSCULAR
  Filled 2022-12-21: qty 1

## 2022-12-21 MED ORDER — LABETALOL HCL 200 MG PO TABS
300.0000 mg | ORAL_TABLET | Freq: Two times a day (BID) | ORAL | Status: DC
  Administered 2022-12-21: 300 mg via ORAL
  Filled 2022-12-21: qty 1

## 2022-12-21 MED ORDER — DOCUSATE SODIUM 100 MG PO CAPS
100.0000 mg | ORAL_CAPSULE | Freq: Two times a day (BID) | ORAL | 0 refills | Status: AC
  Filled 2022-12-21: qty 100, 50d supply, fill #0

## 2022-12-21 MED ORDER — LABETALOL HCL 300 MG PO TABS
300.0000 mg | ORAL_TABLET | Freq: Two times a day (BID) | ORAL | 0 refills | Status: AC
  Filled 2022-12-21: qty 60, 30d supply, fill #0

## 2022-12-21 MED ORDER — OXYCODONE HCL 5 MG PO TABS
5.0000 mg | ORAL_TABLET | Freq: Four times a day (QID) | ORAL | 0 refills | Status: AC | PRN
  Filled 2022-12-21: qty 24, 6d supply, fill #0

## 2022-12-21 MED ORDER — GABAPENTIN 300 MG PO CAPS
300.0000 mg | ORAL_CAPSULE | Freq: Three times a day (TID) | ORAL | 0 refills | Status: AC
  Filled 2022-12-21: qty 9, 3d supply, fill #0

## 2022-12-21 NOTE — Progress Notes (Signed)
CSW acknowledged consult and acknowledged that infant passed. CSW followed up with MOB's RN. RN reported that MOB declined to speak with CSW and denied needing any resources.   CSW available if MOB requests support/assistance.   Celso Sickle, LCSW Clinical Social Worker [facility] Cell#: [phone]

## 2022-12-28 ENCOUNTER — Ambulatory Visit: Payer: 59

## 2023-01-01 ENCOUNTER — Telehealth: Payer: Self-pay | Admitting: Neonatology

## 2023-01-01 NOTE — Telephone Encounter (Signed)
NAS postpartum telephone consult follow-up call made. Shannon Gonzales answered her phone. In an effort to establish a safe and trusted space to encourage communication, opted to use this phone call to discuss how Shannon Gonzales was coping after the loss of her son, Shannon Gonzales. She stated that she is doing well and coping "day by day." I verbalized available resources and advocated that Shannon Gonzales call the NAS phone with any needs and/or concerns. Will continue to follow Shannon Gonzales as a resource for bereavement and loss as well as NAS follow up. NAS phone number:  [phone].    Jason Fila NNP-BC

## 2023-01-08 ENCOUNTER — Telehealth (HOSPITAL_COMMUNITY): Payer: Self-pay | Admitting: *Deleted

## 2023-01-08 NOTE — Telephone Encounter (Signed)
01/08/2023  Name: Shannon Gonzales MRN: [id number] DOB: [birth year]  Reason for Call:  Transition of Care Hospital Discharge Call  Contact Status: Patient Contact Status: Complete  Language assistant needed: Interpreter Mode: Interpreter Not Needed        Follow-Up Questions: Do You Have Any Concerns About Your Health As You Heal From Delivery?: No Do You Have Any Concerns About Your Infants Health?:  (infant died on day 2 of life)  Edinburgh Postnatal Depression Scale:  In the Past 7 Days:    PHQ2-9 Depression Scale:     Discharge Follow-up:    Post-discharge interventions: Per patient's request, will phone back later today to complete EPDS.  Salena Saner, RN 01/08/23 12:20pm

## 2023-01-08 NOTE — Telephone Encounter (Signed)
01/08/2023  Name: Shannon Gonzales MRN: [id number] DOB: [birth year]  Reason for Call:  Transition of Care Hospital Discharge Call  Contact Status: Patient Contact Status: Message  Language assistant needed: Interpreter Mode: Interpreter Not Needed        Follow-Up Questions:    Inocente Salles Postnatal Depression Scale:  In the Past 7 Days:  Attempted 2nd call to complete EPDS.  PHQ2-9 Depression Scale:     Discharge Follow-up:    Post-discharge interventions: NA  Salena Saner, RN  01/08/23 13:43pm

## 2023-01-22 ENCOUNTER — Ambulatory Visit: Payer: 59 | Admitting: Obstetrics and Gynecology

## 2023-01-25 ENCOUNTER — Telehealth: Payer: Self-pay | Admitting: Neonatology

## 2023-01-25 NOTE — Telephone Encounter (Signed)
NAS postpartum telephone consult follow-up attempted with no answer. Able to leave HIPAA compliant voicemail. Will call again in 4 weeks. Belem verbalized previously that she was open to receiving monthly consult check ins. Encouraged Kendall to call NAS phone with any concerns or needs.  [phone].    Jason Fila, NNP-BC

## 2023-04-01 ENCOUNTER — Telehealth: Payer: Self-pay | Admitting: Neonatology

## 2023-04-01 NOTE — Telephone Encounter (Signed)
NAS postpartum telephone consult follow-up attempted with no answer. Unable to leave HIPAA compliant voicemail, as voicemail was not set up. Will call again in 4 weeks.  Jason Fila NNP-BC

## 2024-07-12 ENCOUNTER — Emergency Department (HOSPITAL_COMMUNITY): Payer: MEDICAID

## 2024-07-12 ENCOUNTER — Other Ambulatory Visit: Payer: Self-pay

## 2024-07-12 ENCOUNTER — Emergency Department (HOSPITAL_COMMUNITY)
Admission: EM | Admit: 2024-07-12 | Discharge: 2024-07-12 | Disposition: A | Discharge status: Home / Self Care | Payer: MEDICAID | Attending: Emergency Medicine | Admitting: Emergency Medicine

## 2024-07-12 ENCOUNTER — Encounter (HOSPITAL_COMMUNITY): Payer: Self-pay

## 2024-07-12 DIAGNOSIS — I10 Essential (primary) hypertension: Secondary | ICD-10-CM | POA: Diagnosis not present

## 2024-07-12 DIAGNOSIS — R059 Cough, unspecified: Secondary | ICD-10-CM | POA: Insufficient documentation

## 2024-07-12 DIAGNOSIS — R079 Chest pain, unspecified: Secondary | ICD-10-CM | POA: Insufficient documentation

## 2024-07-12 LAB — BASIC METABOLIC PANEL WITH GFR
Anion gap: 16 — ABNORMAL HIGH (ref 5–15)
BUN: 7 mg/dL (ref 6–20)
CO2: 22 mmol/L (ref 22–32)
Calcium: 9.7 mg/dL (ref 8.9–10.3)
Chloride: 98 mmol/L (ref 98–111)
Creatinine, Ser: 0.53 mg/dL (ref 0.44–1.00)
GFR, Estimated: >60 mL/min
Glucose, Bld: 80 mg/dL (ref 70–99)
Potassium: 3 mmol/L — ABNORMAL LOW (ref 3.5–5.1)
Sodium: 135 mmol/L (ref 135–145)

## 2024-07-12 LAB — CBC WITH DIFFERENTIAL/PLATELET
Abs Immature Granulocytes: 0.04 K/uL (ref 0.00–0.07)
Basophils Absolute: 0.1 K/uL (ref 0.0–0.1)
Basophils Relative: 1 %
Eosinophils Absolute: 0.7 K/uL — ABNORMAL HIGH (ref 0.0–0.5)
Eosinophils Relative: 5 %
HCT: 36.7 % (ref 36.0–46.0)
Hemoglobin: 11.9 g/dL — ABNORMAL LOW (ref 12.0–15.0)
Immature Granulocytes: 0 %
Lymphocytes Relative: 14 %
Lymphs Abs: 1.7 K/uL (ref 0.7–4.0)
MCH: 27.7 pg (ref 26.0–34.0)
MCHC: 32.4 g/dL (ref 30.0–36.0)
MCV: 85.3 fL (ref 80.0–100.0)
Monocytes Absolute: 0.6 K/uL (ref 0.1–1.0)
Monocytes Relative: 5 %
Neutro Abs: 9.5 K/uL — ABNORMAL HIGH (ref 1.7–7.7)
Neutrophils Relative %: 75 %
Platelets: 429 K/uL — ABNORMAL HIGH (ref 150–400)
RBC: 4.3 MIL/uL (ref 3.87–5.11)
RDW: 16.1 % — ABNORMAL HIGH (ref 11.5–15.5)
WBC: 12.5 K/uL — ABNORMAL HIGH (ref 4.0–10.5)
nRBC: 0 % (ref 0.0–0.2)

## 2024-07-12 LAB — RESP PANEL BY RT-PCR (RSV, FLU A&B, COVID)  RVPGX2
Influenza A by PCR: NEGATIVE
Influenza B by PCR: NEGATIVE
Resp Syncytial Virus by PCR: NEGATIVE
SARS Coronavirus 2 by RT PCR: NEGATIVE

## 2024-07-12 LAB — TROPONIN T, HIGH SENSITIVITY: Troponin T High Sensitivity: <15 ng/L (ref 0–19)

## 2024-07-12 LAB — PREGNANCY, URINE: Preg Test, Ur: NEGATIVE

## 2024-07-12 LAB — MAGNESIUM: Magnesium: 1.9 mg/dL (ref 1.7–2.4)

## 2024-07-12 MED ORDER — IOHEXOL 350 MG/ML SOLN
80.0000 mL | Freq: Once | INTRAVENOUS | Status: AC | PRN
  Administered 2024-07-12: 80 mL via INTRAVENOUS

## 2024-07-12 MED ORDER — AMLODIPINE BESYLATE 10 MG PO TABS: 10.0000 mg | ORAL_TABLET | Freq: Every day | ORAL | 2 refills | Status: AC

## 2024-07-12 MED ORDER — LABETALOL HCL 100 MG PO TABS: 100.0000 mg | ORAL_TABLET | Freq: Two times a day (BID) | ORAL | 2 refills | Status: AC

## 2024-07-12 MED ORDER — POTASSIUM CHLORIDE CRYS ER 20 MEQ PO TBCR
40.0000 meq | EXTENDED_RELEASE_TABLET | Freq: Once | ORAL | Status: AC
  Administered 2024-07-12: 40 meq via ORAL
  Filled 2024-07-12: qty 2

## 2024-07-12 NOTE — Discharge Instructions (Addendum)
 Your test results today are reassuring.  Take ibuprofen  and Tylenol  as needed for aches and pains.  New prescriptions for your blood pressure medications were sent to your pharmacy.  Resume taking daily as prescribed.  Return to the emergency department for any new or worsening symptoms of concern.

## 2024-07-12 NOTE — ED Triage Notes (Signed)
 Pt also reports she ran out of her BP medication several months ago.

## 2024-07-12 NOTE — ED Triage Notes (Signed)
 C/o chest pain cough and congestion onset 2 days ago

## 2024-07-12 NOTE — ED Provider Triage Note (Signed)
 Emergency Medicine Provider Triage Evaluation Note  Shannon Gonzales , a 39 y.o. female  was evaluated in triage.  Pt complains of chest pain, cough, and congestion that started 2 days ago.  Denies any nausea, vomiting, diarrhea, abdominal pain.  Denies any sore throat.  Reports that she has also been out of her blood pressure medications for couple months.  Reports that she was previously taking amlodipine  and labetalol  Review of Systems  Positive: As above Negative: As above  Physical Exam  BP (!) 157/120   Pulse 88   Temp 98.4 F (36.9 C)   Resp 20   SpO2 98%  Gen:   Awake, no distress   Resp:  Normal effort  MSK:   Moves extremities without difficulty  Other:  Nasal congestion present  Medical Decision Making  Medically screening exam initiated at 3:31 PM.  Appropriate orders placed.  Shannon Gonzales was informed that the remainder of the evaluation will be completed by another provider, this initial triage assessment does not replace that evaluation, and the importance of remaining in the ED until their evaluation is complete.     [name], PA-C 07/12/24 1531

## 2024-07-12 NOTE — ED Provider Notes (Signed)
 " [place] EMERGENCY DEPARTMENT AT [facility] Provider Note   CSN: [id number] Arrival date & time: 07/12/24  1435     Patient presents with: Chest Pain and Cough   Shannon Gonzales is a 39 y.o. female.    Chest Pain Associated symptoms: cough and shortness of breath   Cough Associated symptoms: chest pain and shortness of breath   Patient presents for chest pain and shortness of breath.  Medical history includes anxiety, depression, HTN, polysubstance abuse.  She has been prescribed amlodipine  and labetalol .  She has not taken blood pressure medications in the last several months.  For the past 2 days, she has had a central chest pain with radiation to the left side of her chest.  She has had associated shortness of breath.  Symptoms have been intermittent.  Today, she was unable to go to work due to the chest pain.  She feels that her chest pain does worsen when she overexerts herself.  She has had recent nasal congestion as well.  She denies any significant cough.  She denies fevers or chills.  She is currently asymptomatic.     Prior to Admission medications  Medication Sig Start Date End Date Taking? Authorizing Provider  labetalol  (NORMODYNE ) 100 MG tablet Take 1 tablet (100 mg total) by mouth 2 (two) times daily. 07/12/24 10/10/24 Yes Melvenia Motto, MD  amLODipine  (NORVASC ) 10 MG tablet Take 1 tablet (10 mg total) by mouth daily. 07/12/24 08/11/24  Melvenia Motto, MD  gabapentin  (NEURONTIN ) 300 MG capsule Take 1 capsule (300 mg total) by mouth 3 (three) times daily for 3 days. 12/21/22 12/24/22  Ozan, Jennifer, DO  hydrOXYzine  (ATARAX /VISTARIL ) 10 MG tablet Take 1 tablet (10 mg total) by mouth every 8 (eight) hours as needed for up to 16 doses for anxiety. 09/29/20   Couture, Cortni S, PA-C  ibuprofen  (ADVIL ) 600 MG tablet Take 1 tablet (600 mg total) by mouth every 6 (six) hours. 12/21/22   Ozan, Jennifer, DO  labetalol  (NORMODYNE ) 300 MG tablet Take 1 tablet (300 mg total) by mouth 2  (two) times daily. 12/21/22 01/20/23  Ozan, Jennifer, DO    Allergies: Patient has no known allergies.    Review of Systems  HENT:  Positive for congestion.   Respiratory:  Positive for cough and shortness of breath.   Cardiovascular:  Positive for chest pain.  All other systems reviewed and are negative.   Updated Vital Signs BP (!) 157/119 (BP Location: Right Arm)   Pulse 85   Temp 98.2 F (36.8 C)   Resp 20   SpO2 100%   Physical Exam Vitals and nursing note reviewed.  Constitutional:      General: She is not in acute distress.    Appearance: She is well-developed. She is not ill-appearing, toxic-appearing or diaphoretic.  HENT:     Head: Normocephalic and atraumatic.  Eyes:     Conjunctiva/sclera: Conjunctivae normal.  Cardiovascular:     Rate and Rhythm: Normal rate and regular rhythm.     Heart sounds: No murmur heard. Pulmonary:     Effort: Pulmonary effort is normal. No respiratory distress.     Breath sounds: Normal breath sounds. No decreased breath sounds, wheezing, rhonchi or rales.  Chest:     Chest wall: No tenderness.  Abdominal:     Palpations: Abdomen is soft.     Tenderness: There is no abdominal tenderness.  Musculoskeletal:        General: No swelling.  Cervical back: Normal range of motion and neck supple.  Skin:    General: Skin is warm and dry.  Neurological:     General: No focal deficit present.     Mental Status: She is alert and oriented to person, place, and time.  Psychiatric:        Mood and Affect: Mood normal.        Behavior: Behavior normal.     (all labs ordered are listed, but only abnormal results are displayed) Labs Reviewed  CBC WITH DIFFERENTIAL/PLATELET - Abnormal; Notable for the following components:      Result Value   WBC 12.5 (*)    Hemoglobin 11.9 (*)    RDW 16.1 (*)    Platelets 429 (*)    Neutro Abs 9.5 (*)    Eosinophils Absolute 0.7 (*)    All other components within normal limits  BASIC METABOLIC  PANEL WITH GFR - Abnormal; Notable for the following components:   Potassium 3.0 (*)    Anion gap 16 (*)    All other components within normal limits  RESP PANEL BY RT-PCR (RSV, FLU A&B, COVID)  RVPGX2  PREGNANCY, URINE  MAGNESIUM   TROPONIN T, HIGH SENSITIVITY    EKG: EKG Interpretation Date/Time:  Saturday July 12 2024 15:34:10 EST Ventricular Rate:  87 PR Interval:  138 QRS Duration:  84 QT Interval:  416 QTC Calculation: 500 R Axis:   71  Text Interpretation: Normal sinus rhythm Right atrial enlargement Minimal voltage criteria for LVH, may be normal variant ( Sokolow-Lyon ) Cannot rule out Anterior infarct , age undetermined Prolonged QT Confirmed by Melvenia Motto [phone]) on 07/12/2024 6:51:36 PM  Radiology: CT Angio Chest PE W and/or Wo Contrast Result Date: 07/12/2024 CLINICAL DATA:  Chest pain EXAM: CT ANGIOGRAPHY CHEST WITH CONTRAST TECHNIQUE: Multidetector CT imaging of the chest was performed using the standard protocol during bolus administration of intravenous contrast. Multiplanar CT image reconstructions and MIPs were obtained to evaluate the vascular anatomy. RADIATION DOSE REDUCTION: This exam was performed according to the departmental dose-optimization program which includes automated exposure control, adjustment of the mA and/or kV according to patient size and/or use of iterative reconstruction technique. CONTRAST:  80mL OMNIPAQUE  IOHEXOL  350 MG/ML SOLN COMPARISON:  Chest x-ray 07/12/2024, CT 09/29/2020 FINDINGS: Cardiovascular: Satisfactory opacification of the pulmonary arteries to the segmental level. No evidence of pulmonary embolism. Borderline cardiomegaly. No pericardial effusion. 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 No aneurysm or dissection. Direct origin of the left vertebral artery from the aortic arch. Mediastinum/Nodes: No enlarged mediastinal, hilar, or axillary lymph nodes. Thyroid gland, trachea, and esophagus demonstrate no  significant findings. Lungs/Pleura: Lungs are clear. No pleural effusion or pneumothorax. Upper Abdomen: No acute finding. Musculoskeletal: No acute osseous abnormality Review of the MIP images confirms the above findings. IMPRESSION: 1. Negative for acute pulmonary embolus or aortic dissection. Clear lung fields. Electronically Signed   By: Luke Bun M.D.   On: 07/12/2024 20:29   DG Chest 2 View Result Date: 07/12/2024 CLINICAL DATA:  Cough, chest pain EXAM: CHEST - 2 VIEW COMPARISON:  October 28, 2022 FINDINGS: The heart size and mediastinal contours are within normal limits. Both lungs are clear. The visualized skeletal structures are unremarkable. IMPRESSION: No active cardiopulmonary disease. Electronically Signed   By: Lynwood Landy Raddle M.D.   On: 07/12/2024 16:34     Procedures   Medications Ordered in the ED  potassium chloride  SA (KLOR-CON  M) CR tablet 40 mEq (  40 mEq Oral Given 07/12/24 1927)  iohexol  (OMNIPAQUE ) 350 MG/ML injection 80 mL (80 mLs Intravenous Contrast Given 07/12/24 2021)                                    Medical Decision Making Amount and/or Complexity of Data Reviewed Labs: ordered. Radiology: ordered.  Risk Prescription drug management.   This patient presents to the ED for concern of chest pain and shortness of breath, this involves an extensive number of treatment options, and is a complaint that carries with it a high risk of complications and morbidity.  The differential diagnosis includes URI, reactive airway disease, pneumonia, anemia, acidosis, pericarditis, pleural effusion, pulmonary edema   Co morbidities / Chronic conditions that complicate the patient evaluation  anxiety, depression, HTN, polysubstance abuse   Additional history obtained:  Additional history obtained from EMR External records from outside source obtained and reviewed including N/A   Lab Tests:  I Ordered, and personally interpreted labs.  The pertinent results include:  Mild leukocytosis, anemia improved from 1 year ago, normal kidney function, hypokalemia with otherwise normal electrolytes, normal troponin   Imaging Studies ordered:  I ordered imaging studies including chest x-ray, CTA chest I independently visualized and interpreted imaging which showed no acute findings I agree with the radiologist interpretation   Cardiac Monitoring: / EKG:  The patient was maintained on a cardiac monitor.  I personally viewed and interpreted the cardiac monitored which showed an underlying rhythm of: Sinus rhythm   Problem List / ED Course / Critical interventions / Medication management  Patient presenting for chest pain and shortness of breath.  On arrival in the ED, vital signs notable for hypertension.  She has been nonadherent to her home blood pressure medications.  Initial workup is notable for leukocytosis and hypokalemia.  On exam, patient is well-appearing.  She states that she is currently pain-free.  Her breathing is unlabored and lungs are clear to auscultation.  Chest x-ray did not show any acute findings.  Will obtain CTA.  CTA did not show any acute findings.  On reassessment, patient resting comfortably.  She remains asymptomatic.  She states that she has not taken her blood pressure medications because her prescriptions ran out.  Prescriptions were refilled.  Patient was discharged in stable condition. I ordered medication including potassium chloride  for hypokalemia Reevaluation of the patient after these medicines showed that the patient stayed the same I have reviewed the patients home medicines and have made adjustments as needed  Social Determinants of Health:  Lives independently     Final diagnoses:  Chest pain, unspecified type    ED Discharge Orders          Ordered    amLODipine  (NORVASC ) 10 MG tablet  Daily        07/12/24 2043    labetalol  (NORMODYNE ) 100 MG tablet  2 times daily        07/12/24 2043                Melvenia Motto, MD 07/12/24 2044  "
